# Patient Record
Sex: Female | Born: 1945
Health system: Southern US, Academic
[De-identification: ages and names within clinical notes are randomized; demographics above are authoritative.]

## PROBLEM LIST (undated history)

## (undated) DIAGNOSIS — G4730 Sleep apnea, unspecified: Secondary | ICD-10-CM

## (undated) DIAGNOSIS — F32A Depression, unspecified: Secondary | ICD-10-CM

## (undated) DIAGNOSIS — R0602 Shortness of breath: Secondary | ICD-10-CM

## (undated) DIAGNOSIS — M1990 Unspecified osteoarthritis, unspecified site: Secondary | ICD-10-CM

## (undated) DIAGNOSIS — E079 Disorder of thyroid, unspecified: Secondary | ICD-10-CM

## (undated) DIAGNOSIS — E119 Type 2 diabetes mellitus without complications: Secondary | ICD-10-CM

## (undated) DIAGNOSIS — F419 Anxiety disorder, unspecified: Secondary | ICD-10-CM

## (undated) DIAGNOSIS — Z9181 History of falling: Secondary | ICD-10-CM

## (undated) DIAGNOSIS — I10 Essential (primary) hypertension: Secondary | ICD-10-CM

## (undated) DIAGNOSIS — F329 Major depressive disorder, single episode, unspecified: Secondary | ICD-10-CM

## (undated) DIAGNOSIS — Z86718 Personal history of other venous thrombosis and embolism: Secondary | ICD-10-CM

## (undated) DIAGNOSIS — L02415 Cutaneous abscess of right lower limb: Secondary | ICD-10-CM

## (undated) DIAGNOSIS — K219 Gastro-esophageal reflux disease without esophagitis: Secondary | ICD-10-CM

## (undated) DIAGNOSIS — L03115 Cellulitis of right lower limb: Secondary | ICD-10-CM

## (undated) HISTORY — PX: HAND SURGERY: SHX662

## (undated) HISTORY — PX: HX BUNIONECTOMY: SHX129

## (undated) HISTORY — PX: HX TUBAL LIGATION: SHX77

---

## 1996-01-02 ENCOUNTER — Inpatient Hospital Stay (HOSPITAL_COMMUNITY): Payer: Self-pay

## 2018-12-04 IMAGING — US ABD LIMITED
1 series · 14 of 25 positions shown · non-contrast
Comparison: None.

EXAM:  PERNA PROFESSIONAL READ ABD U/S LMTD
INDICATION: Fatty liver.

[Series 1: abd limited · 14 of 63 slices shown]
[im 1/63]
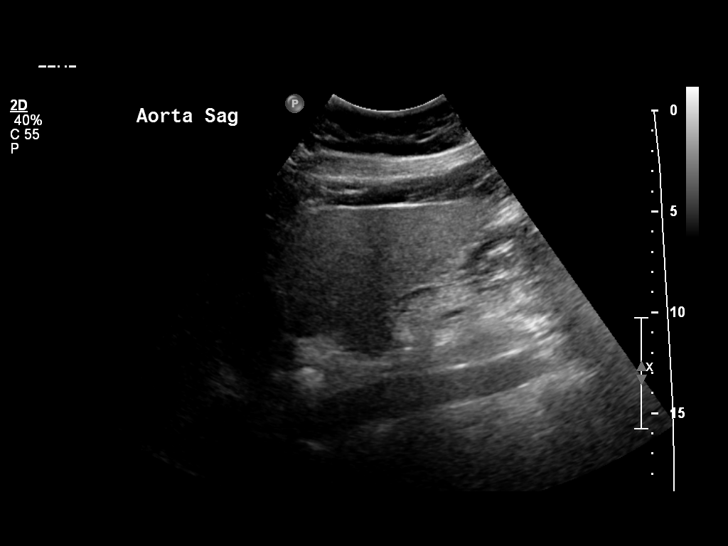
[im 6/63]
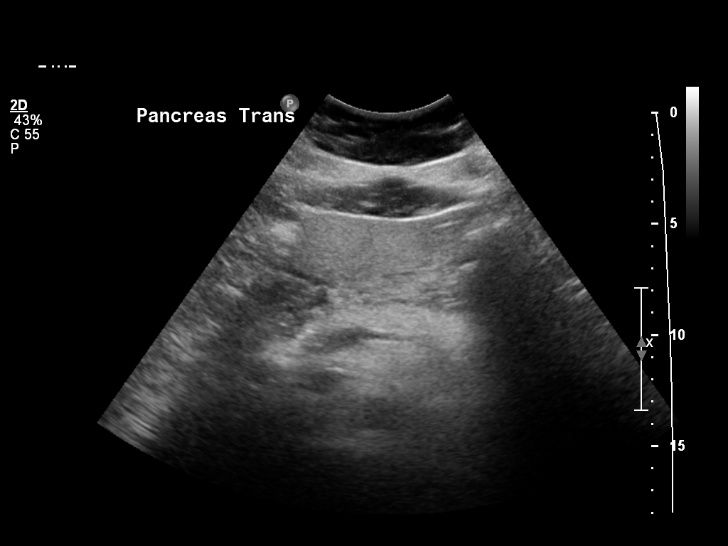
[im 11/63]
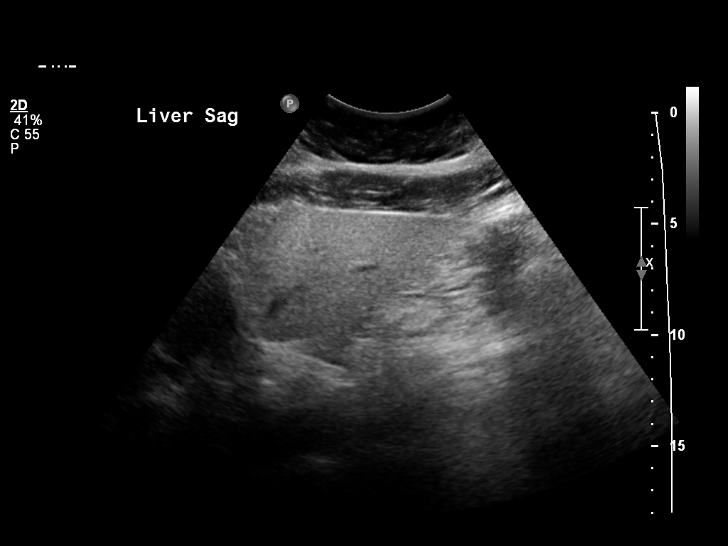
[im 16/63]
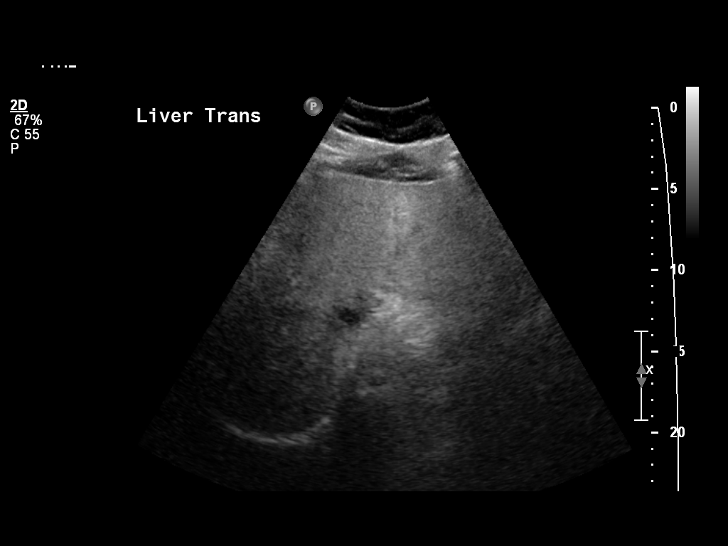
[im 21/63]
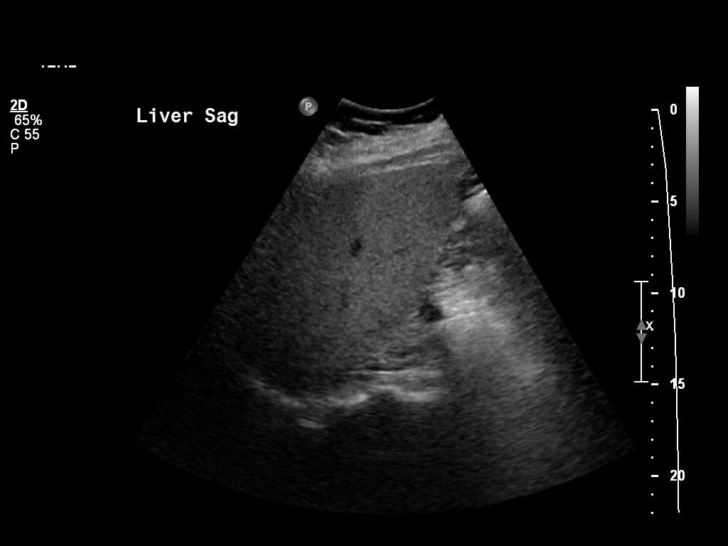
[im 24/63]
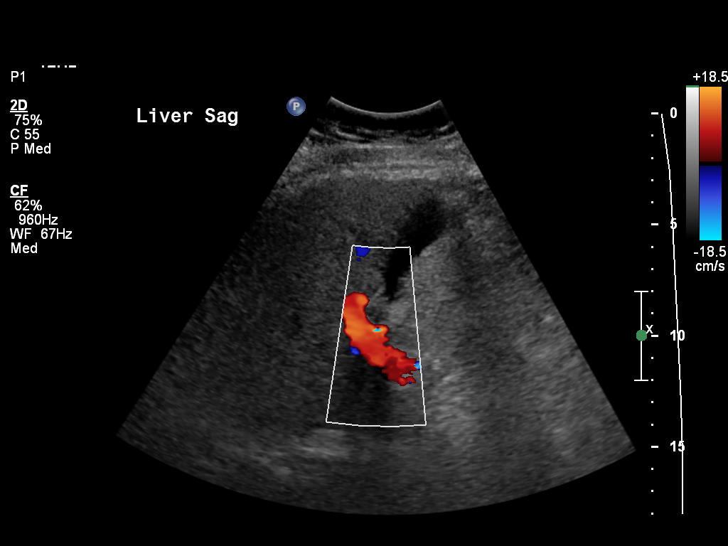
[im 29/63]
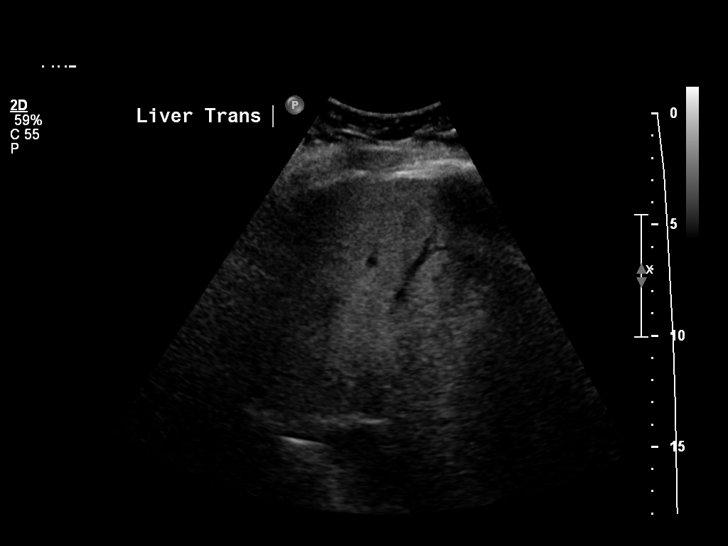
[im 34/63]
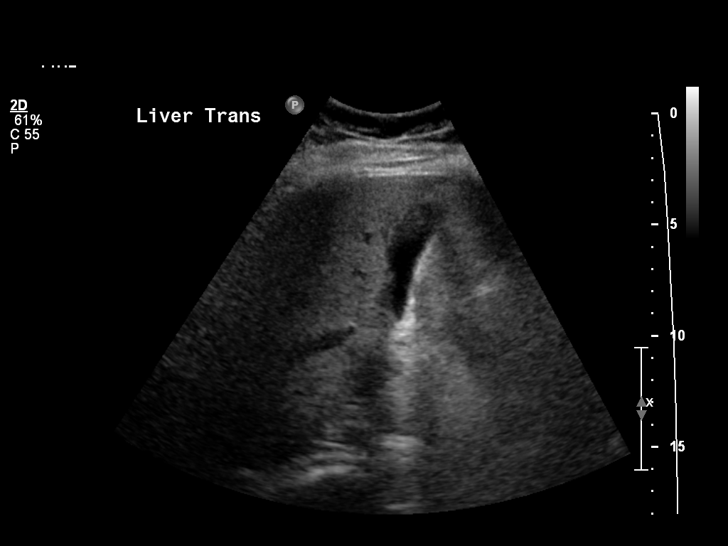
[im 39/63]
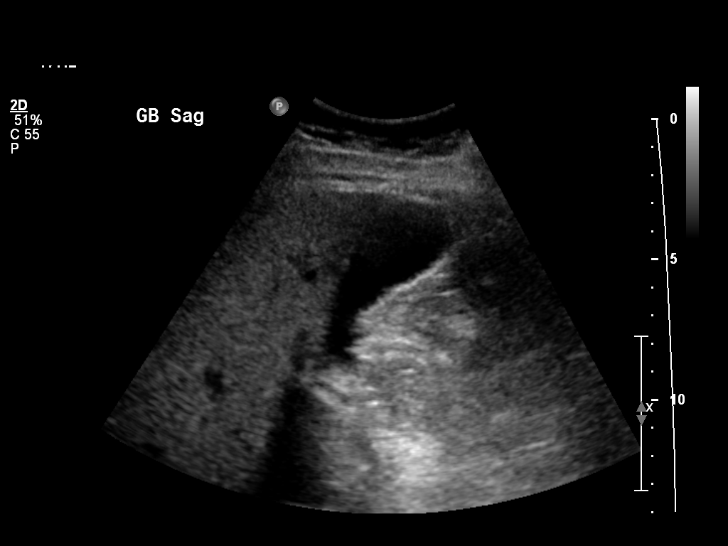
[im 42/63]
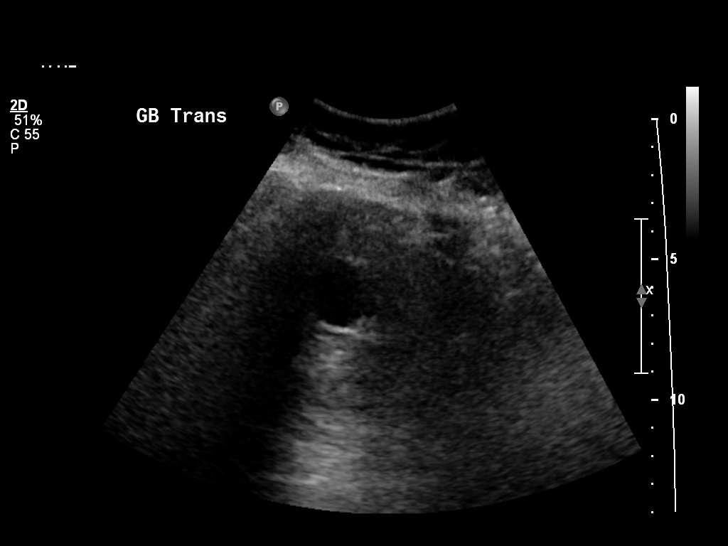
[im 47/63]
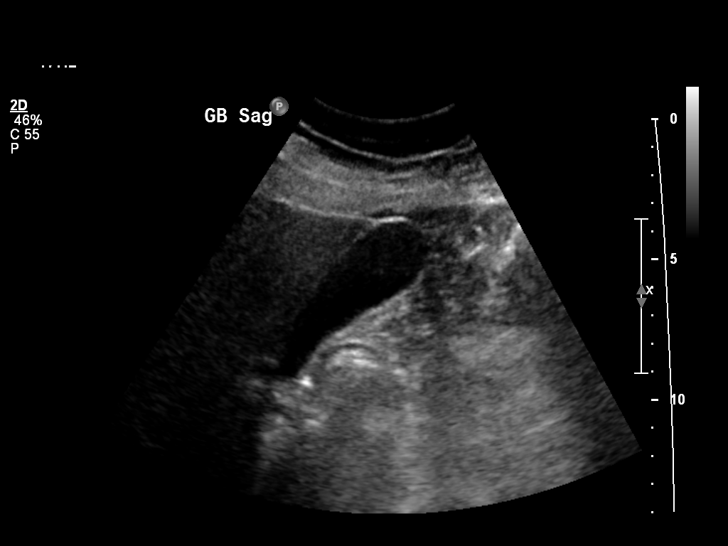
[im 52/63]
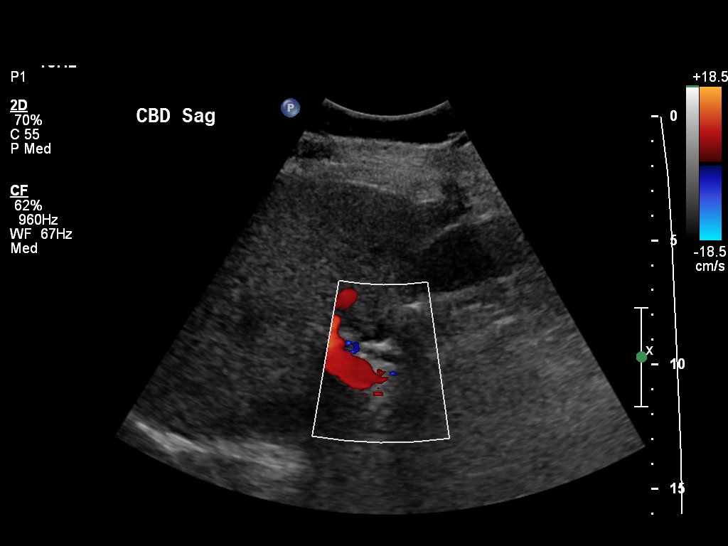
[im 57/63]
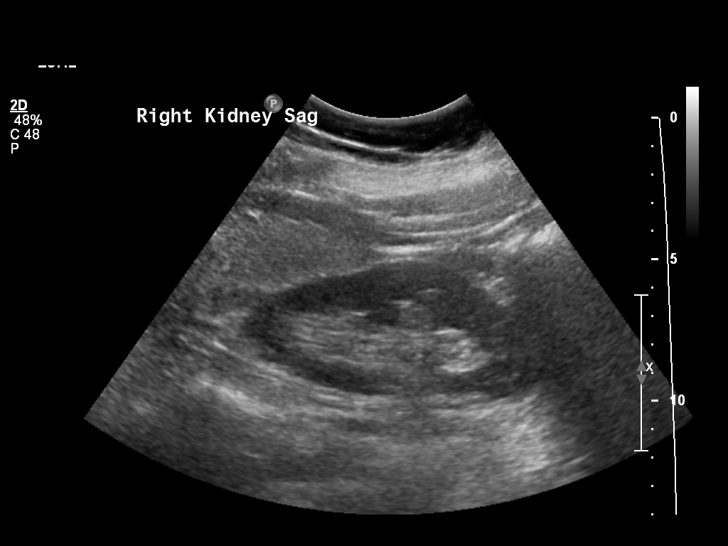
[im 63/63]
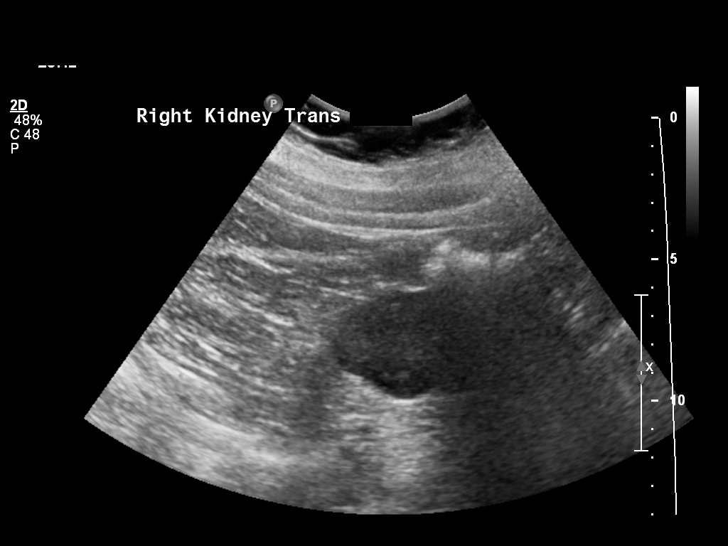

[14 of 25 positions shown; findings below may reference images not displayed]

FINDINGS: The aorta, inferior vena cava and pancreas are partially visualized.  The liver measures 16.5 cm and shows increased echogenicity compatible with fatty infiltration.  The portal vein measures 1.1 cm and shows hepatopetal flow.  The hepatic vein is patent.  No echogenic filling defects are seen within the gallbladder.  The gallbladder wall measures 0.25 cm.  The caliber of the common duct is 3.8 mm.  The right kidney measures 11.2 cm.  No hydronephrosis is noted.  No abdominal ascites is seen.
IMPRESSION: Increased echogenicity of the liver compatible with fatty infiltration.

## 2019-08-16 IMAGING — US ABD LIMITED
1 series · 14 of 25 positions shown · non-contrast
Comparison: None.

EXAM:  MARRERO PROFESSIONAL READ ABD U/S LMTD
INDICATION: Fatty liver.

[Series 1: abd limited · 14 of 65 slices shown]
[im 1/65]
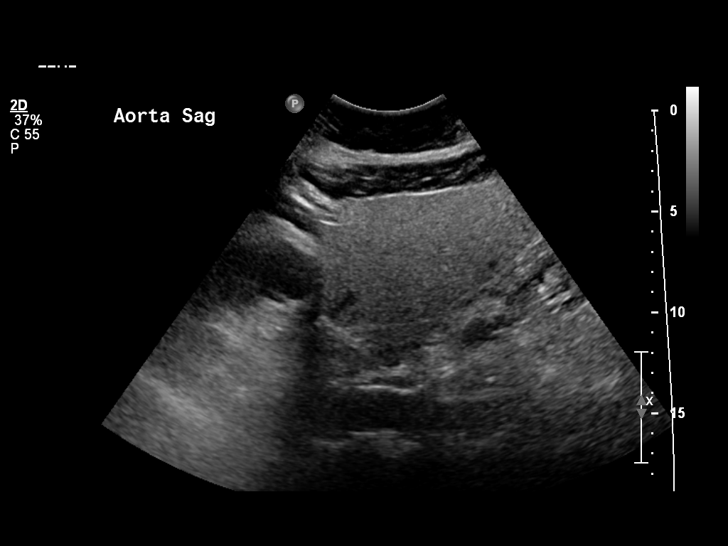
[im 6/65]
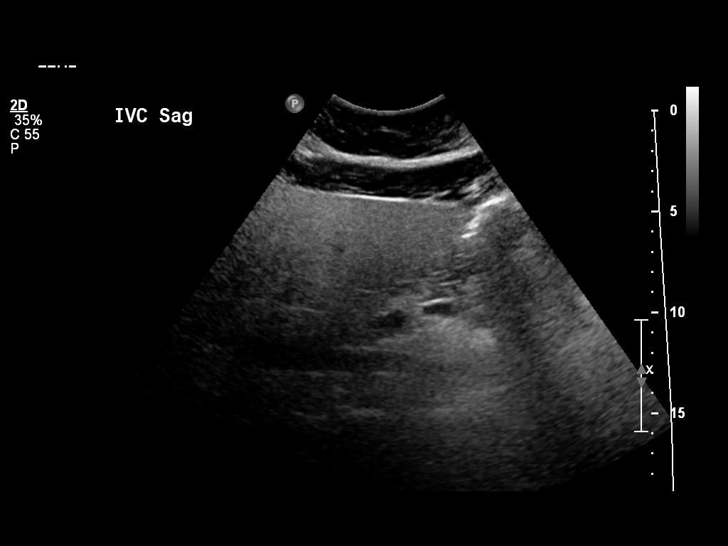
[im 11/65]
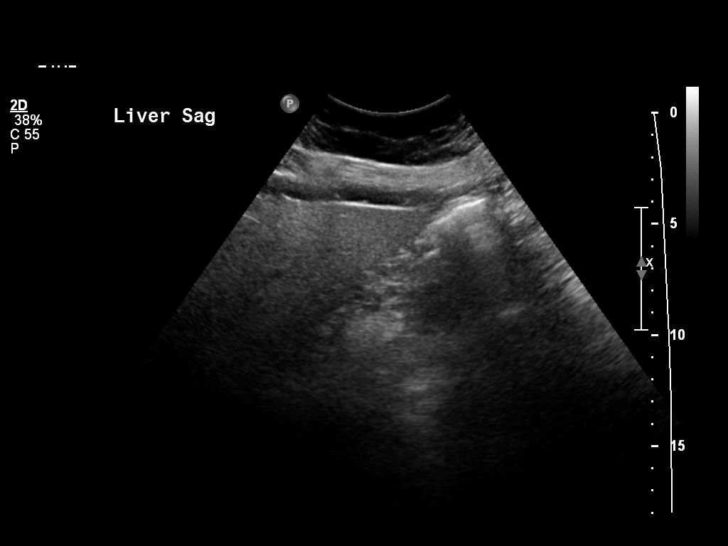
[im 17/65]
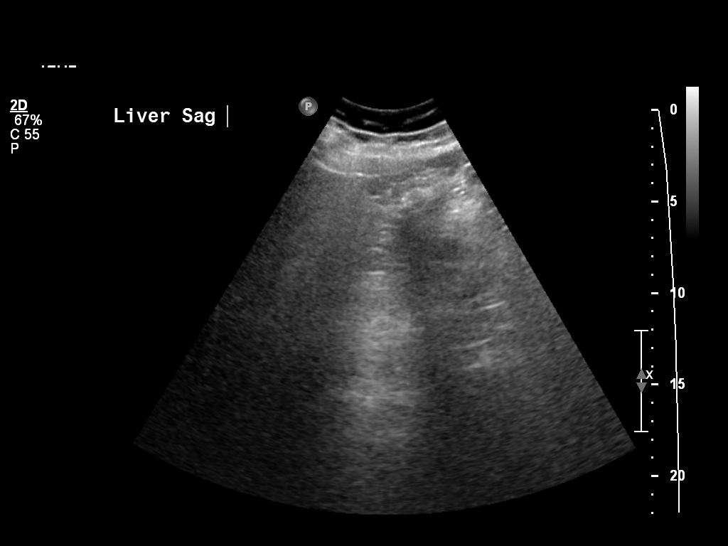
[im 22/65]
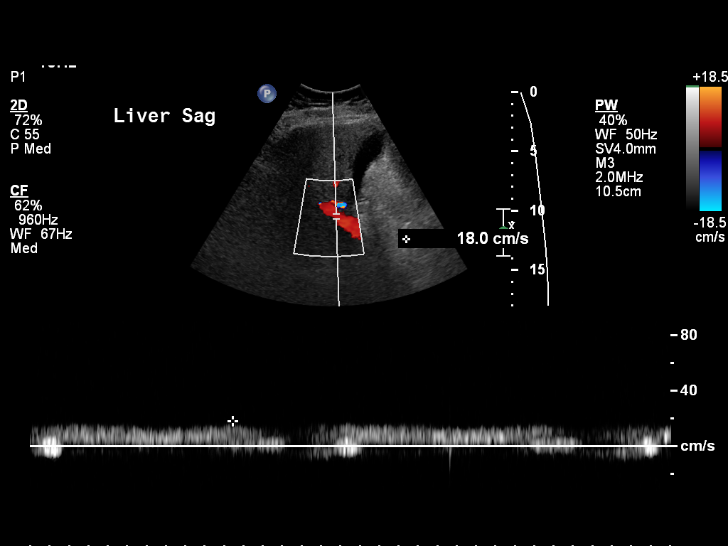
[im 25/65]
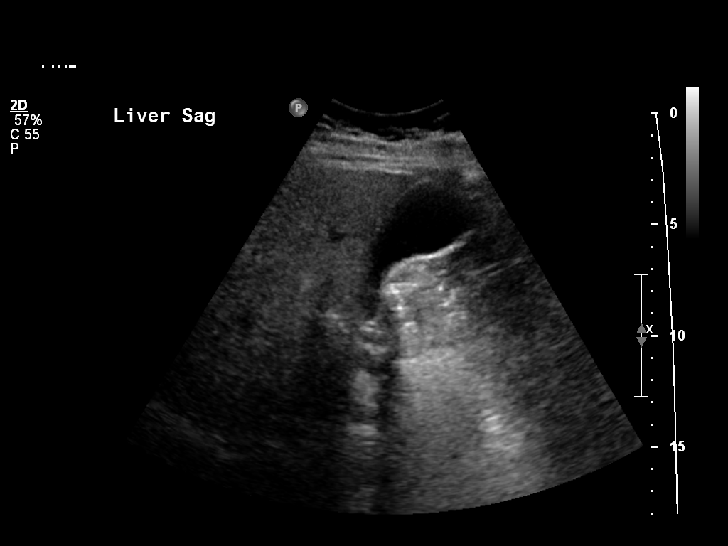
[im 30/65]
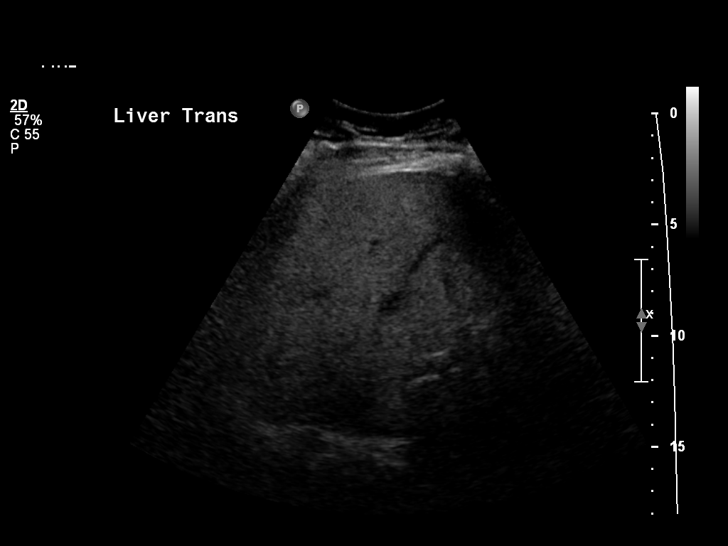
[im 35/65]
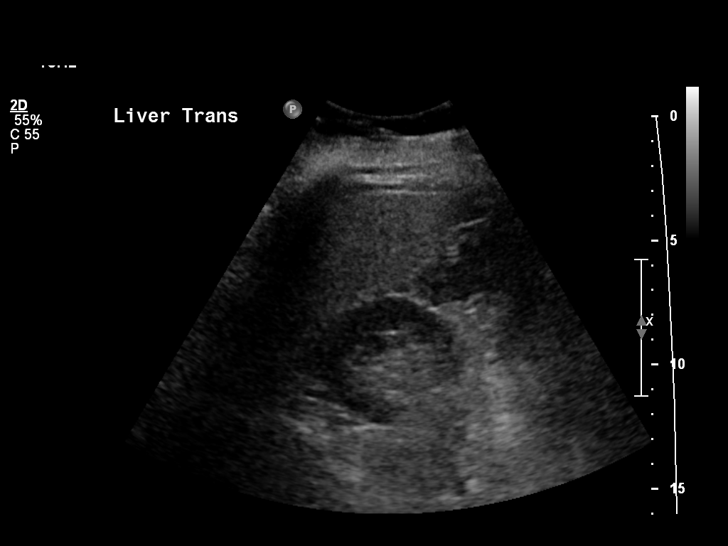
[im 41/65]
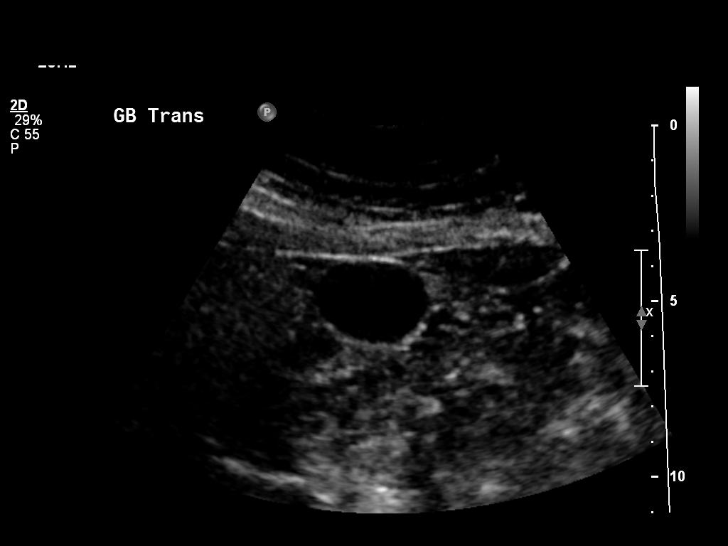
[im 43/65]
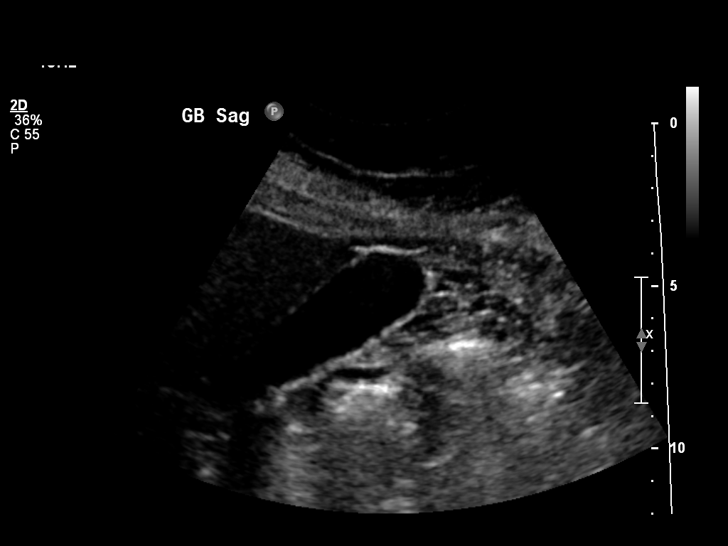
[im 49/65]
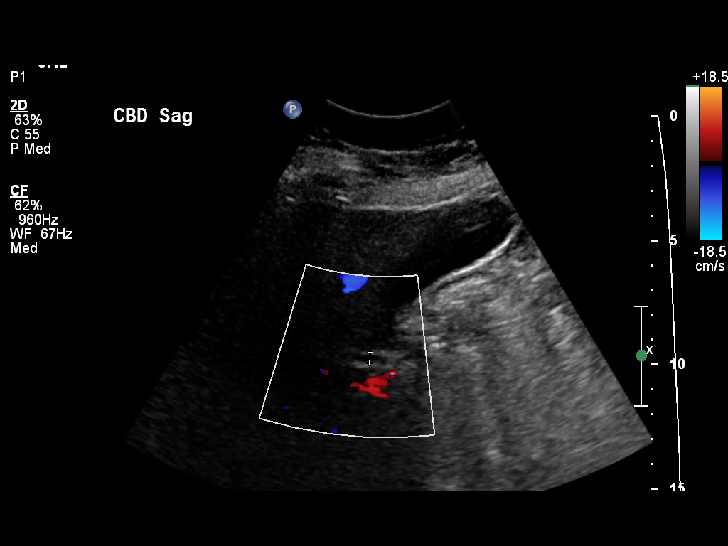
[im 54/65]
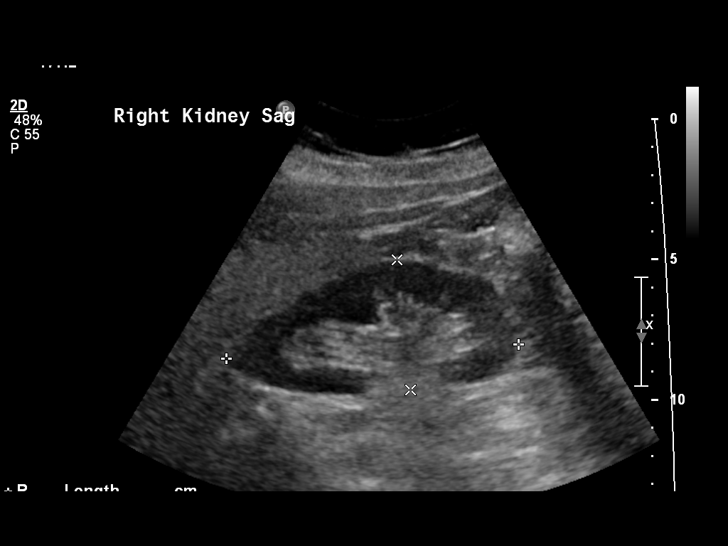
[im 59/65]
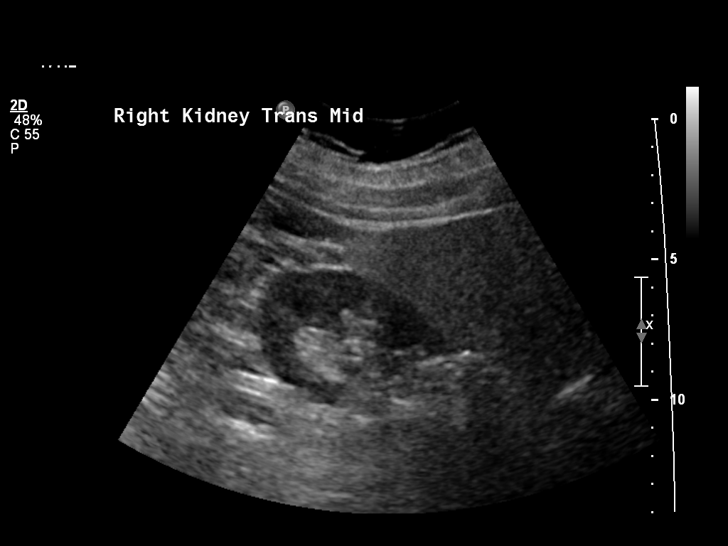
[im 65/65]
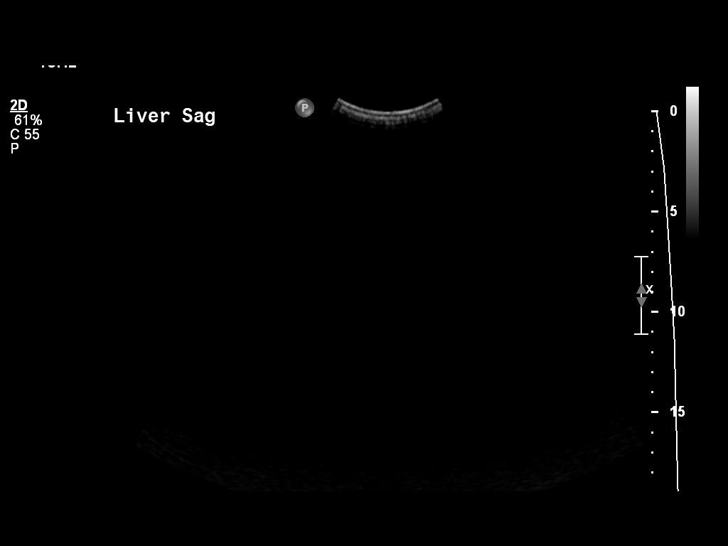

[14 of 25 positions shown; findings below may reference images not displayed]

FINDINGS: The aorta, inferior vena cava and the pancreas are partially visualized.  The liver measures 16.6 cm and shows increased echogenicity.  The portal vein measures 0.8 cm and shows hepatopetal flow.  The hepatic vein is patent.  No echogenic filling defects are seen in the gallbladder.  The gallbladder wall measures 0.39 cm.  The caliber of the common bile duct is 4.3 mm.  The right kidney measures 10.3 cm.   No hydronephrosis is seen.  No ascites is present.
IMPRESSION: Increased echogenicity of the liver compatible with fatty infiltration.

## 2020-05-21 IMAGING — US ABD LIMITED
1 series · 14 of 25 positions shown · non-contrast
Comparison: 02/26/2018.

EXAM:  FOREVAR PROFESSIONAL READ ABD U/S LMTD
INDICATION: Fatty liver.

[Series 1: abd limited · 14 of 59 slices shown]
[im 1/59]
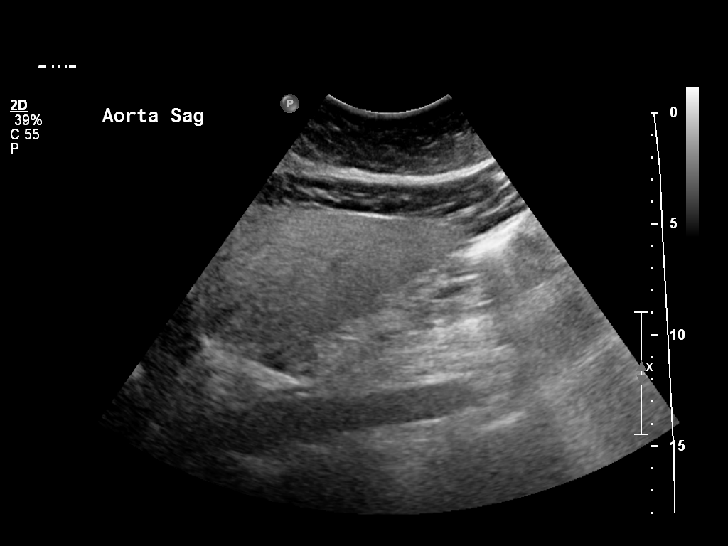
[im 5/59]
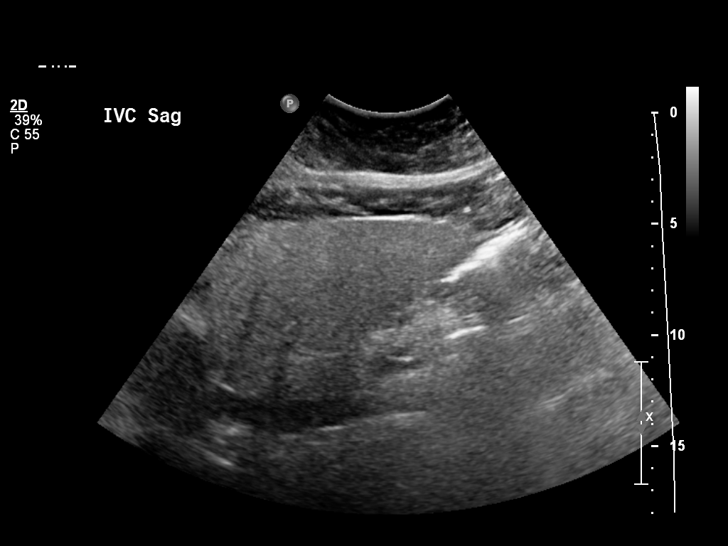
[im 10/59]
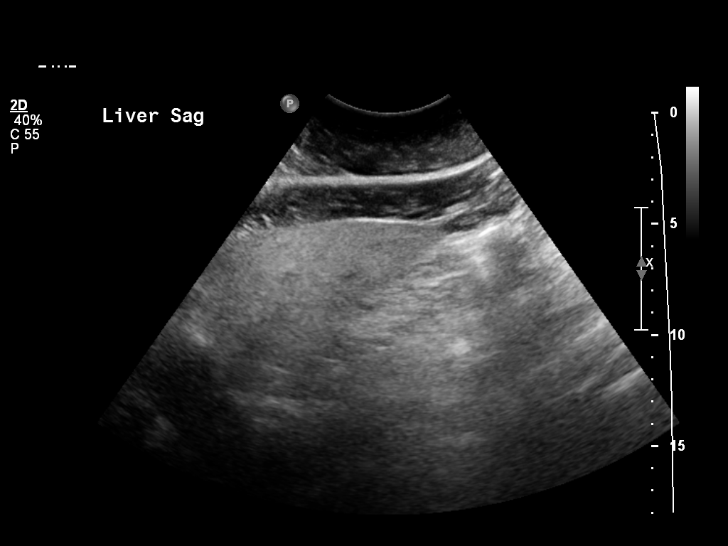
[im 15/59]
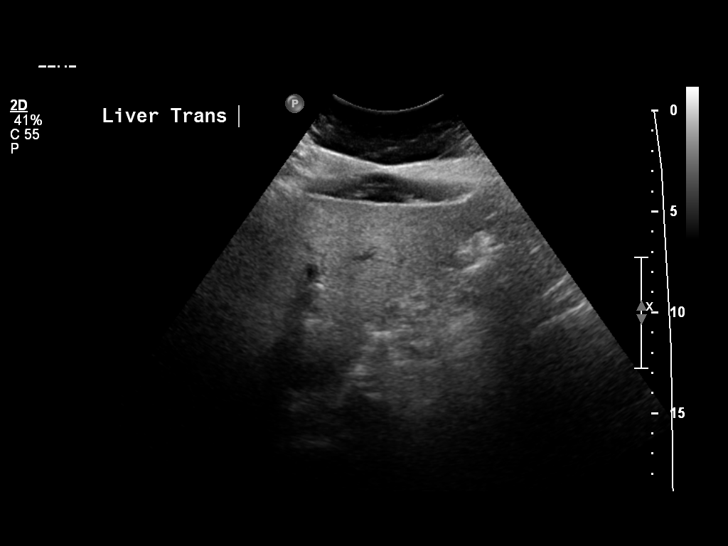
[im 20/59]
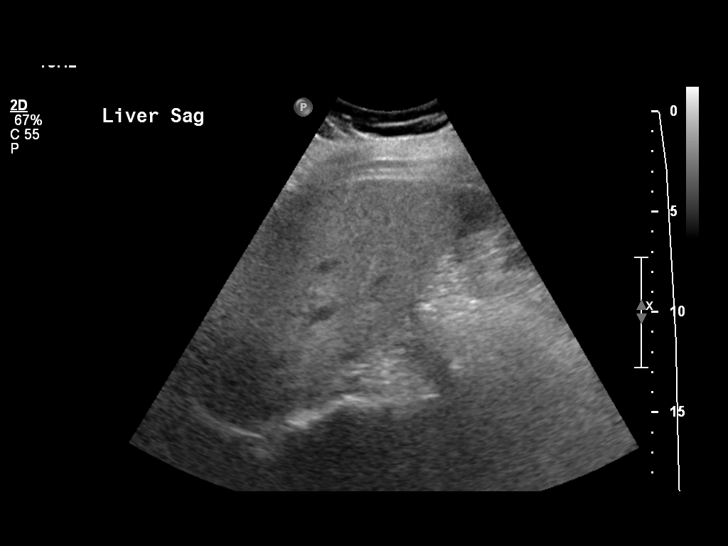
[im 22/59]
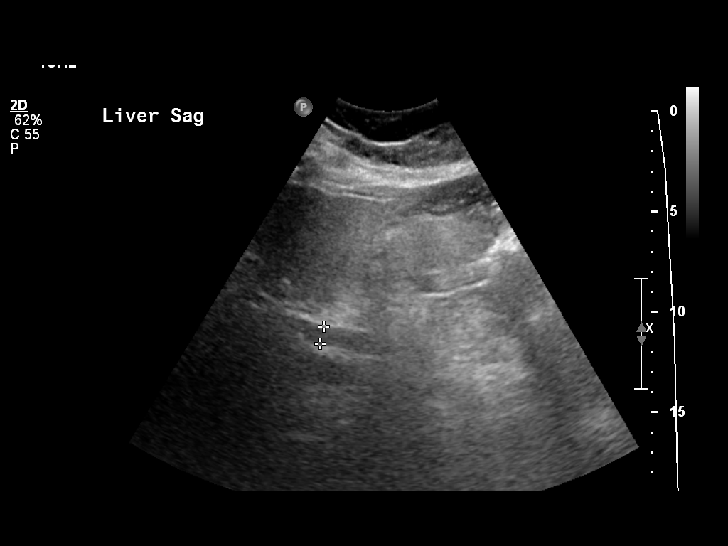
[im 27/59]
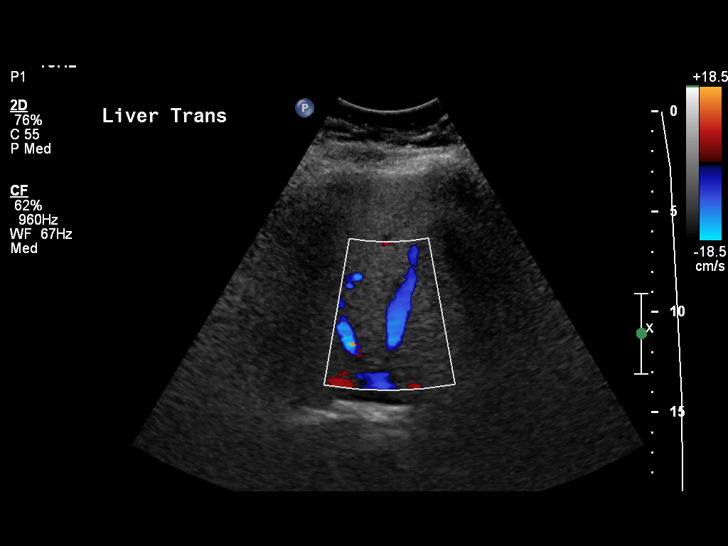
[im 32/59]
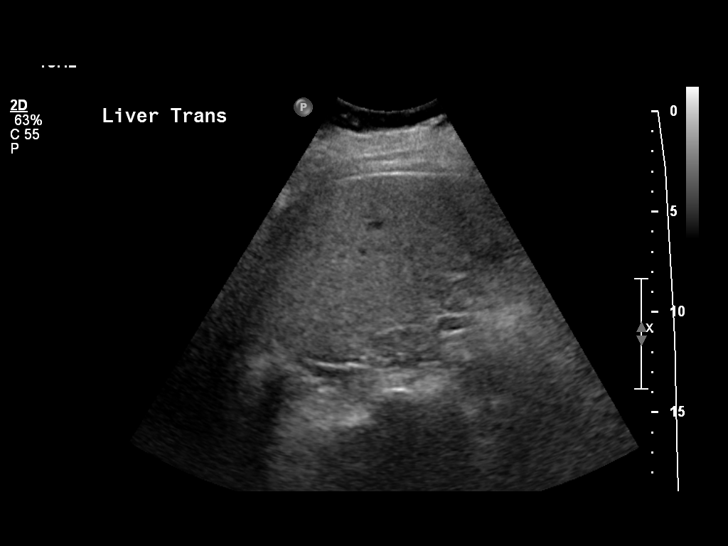
[im 37/59]
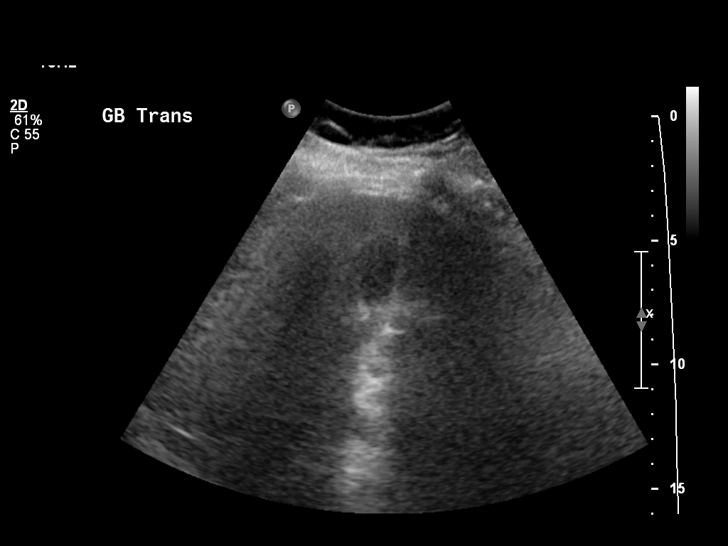
[im 39/59]
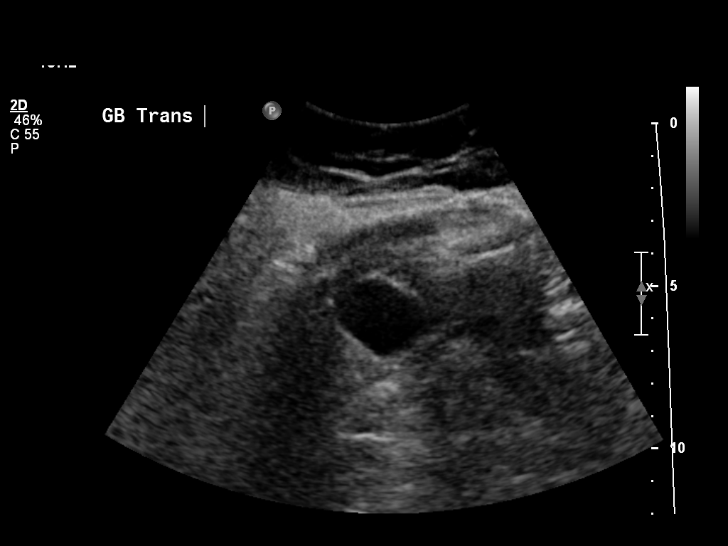
[im 44/59]
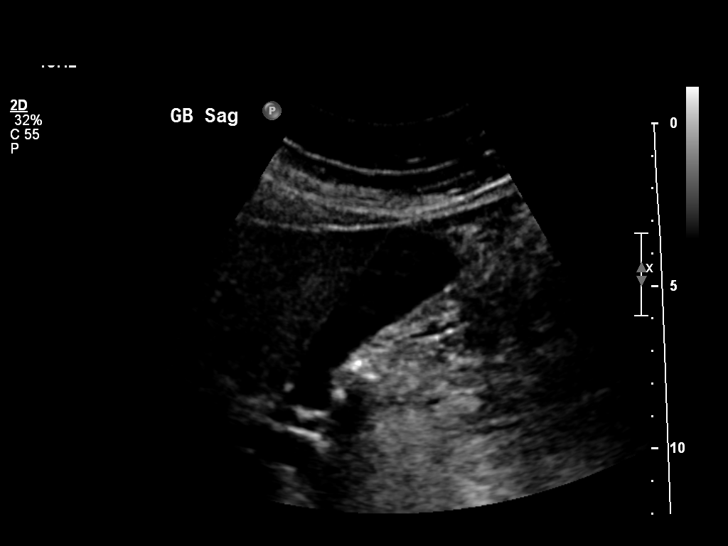
[im 49/59]
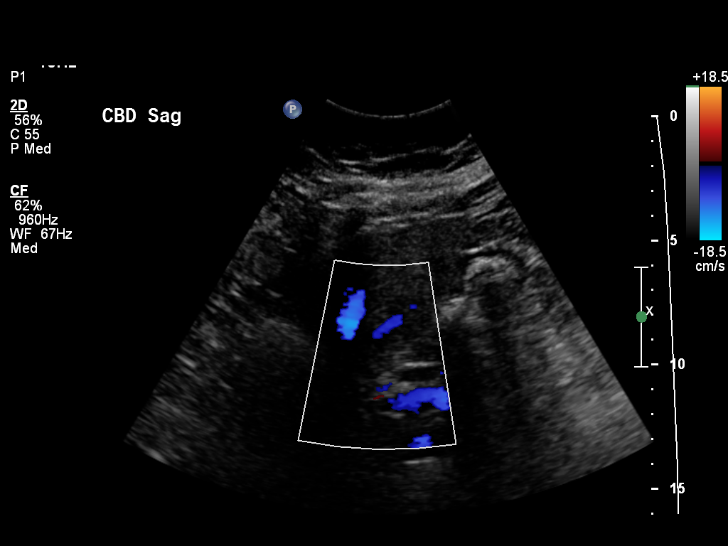
[im 54/59]
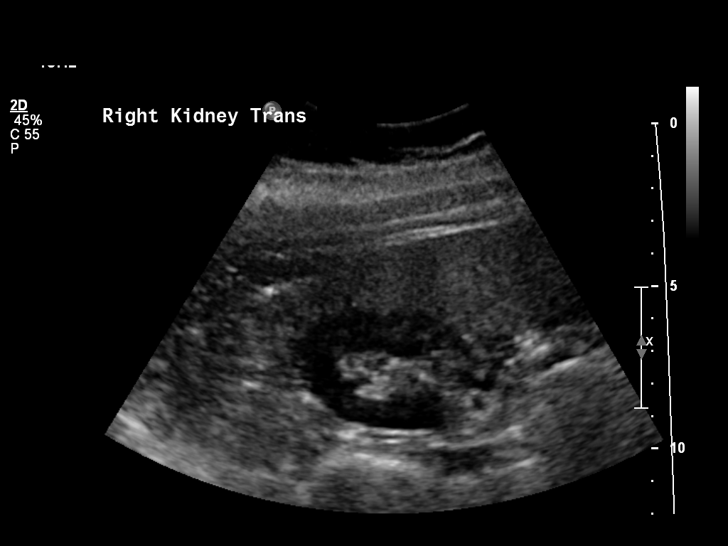
[im 59/59]
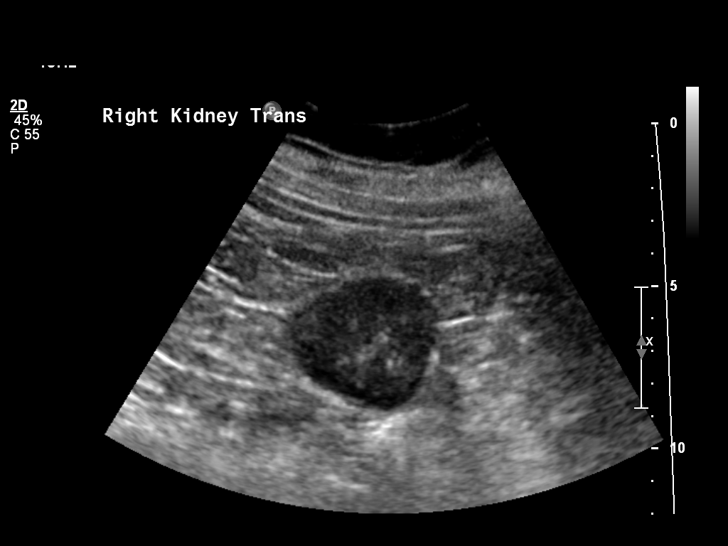

[14 of 25 positions shown; findings below may reference images not displayed]

FINDINGS: Liver is echogenic compatible with fatty infiltration. Fatty infiltration limits evaluation for focal hepatic mass. There is no intra or extrahepatic biliary ductal dilatation. Common bile duct measures 5 mm. No sludge or shadowing gallstones are seen. There is no gallbladder wall thickening or pericholecystic fluid. Pancreas is incompletely visualized due to artifact from overlying bowel gas. Right kidney measures 10 cm and is normal.

Visualized abdominal aorta is without aneurysmal dilatation. IVC is normal. Portal vein measures 9 mm in diameter and demonstrates hepatopetal flow. Hepatic veins are also patent. There is no ascites.
IMPRESSION: 1. Fatty liver. 

2. No evidence of cholelithiasis or acute cholecystitis. 

3. Pancreas incompletely visualized due to artifact from overlying bowel gas.

## 2021-06-18 IMAGING — MR MRI BRAIN WITHOUT AND WITH CONTRAST
10 of 12 series · 35 of 48 positions shown · IV contrast (gadavist)
Comparison: None available.

﻿EXAM:  [id number]   MRI BRAIN WITHOUT AND WITH CONTRAST
INDICATION: 75-year-old female with history of loss of balance.  Hypertension and diabetes.
TECHNIQUE: Axial, coronal and sagittal images including thin section high-resolution images of posterior fossa and cerebellopontine angles and including postcontrast series after administration of 5 mL Gadavist IV.

[Series 6: DWI · axial · 5.0mm · 1.35mm/px · z∈[-67,+59]mm · 9 of 88 slices shown (1 of 3)]
[im 1/88]
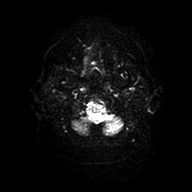
[im 16/88]
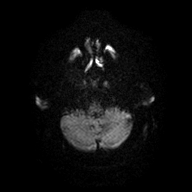
[im 24/88]
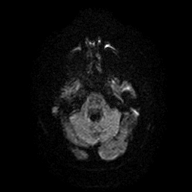
[im 40/88]
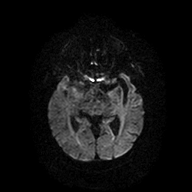
[im 48/88]
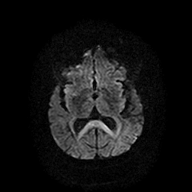
[im 64/88]
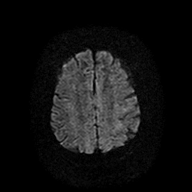
[im 72/88]
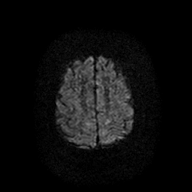
[im 80/88]
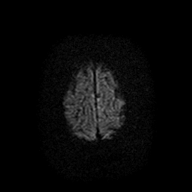
[im 88/88]
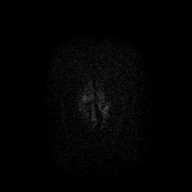

[Series 7: DWI · axial · 5.0mm · 1.35mm/px · z∈[-67,+59]mm · 4 of 22 slices shown (2 of 3)]
[im 1/22]
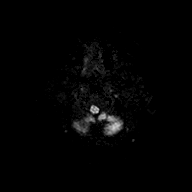
[im 8/22]
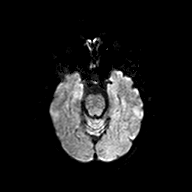
[im 15/22]
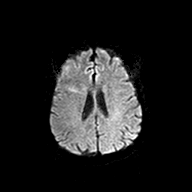
[im 22/22]
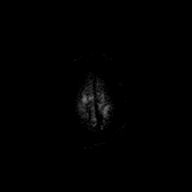

[Series 8: DWI · axial · 5.0mm · 1.35mm/px · z∈[-67,+59]mm · 3 of 21 slices shown (3 of 3)]
[im 1/21]
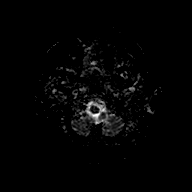
[im 11/21]
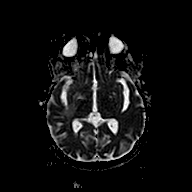
[im 21/21]
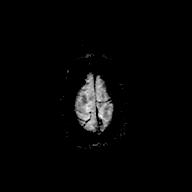

[Series 9: FLAIR · sagittal · 4.0mm · 0.75mm/px · 3 of 26 slices shown (1 of 2)]
[im 1/26]
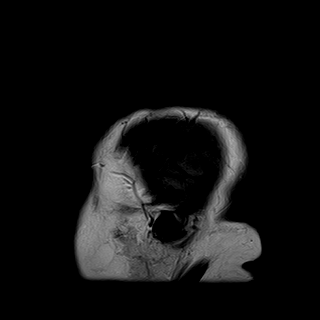
[im 13/26]
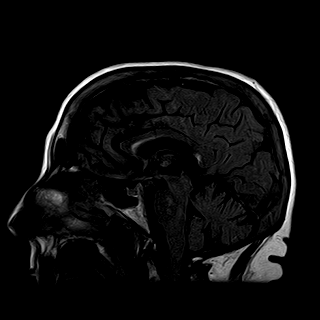
[im 26/26]
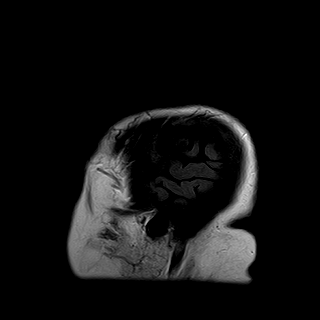

[Series 10: T2 · axial · 4.0mm · 0.43mm/px · z∈[-68,+72]mm · 4 of 36 slices shown]
[im 1/36]
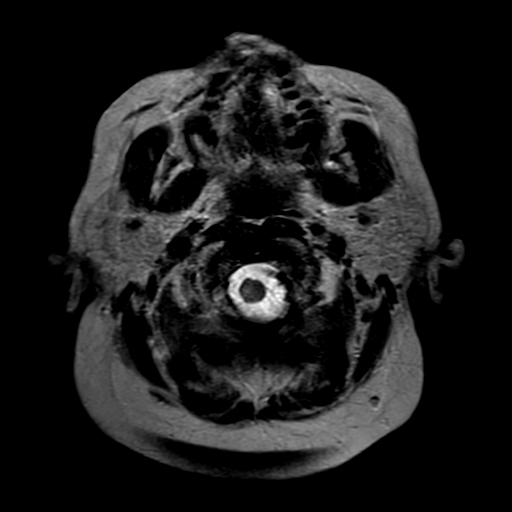
[im 12/36]
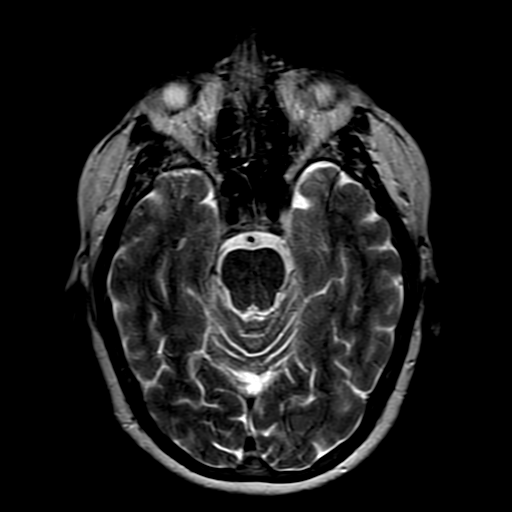
[im 24/36]
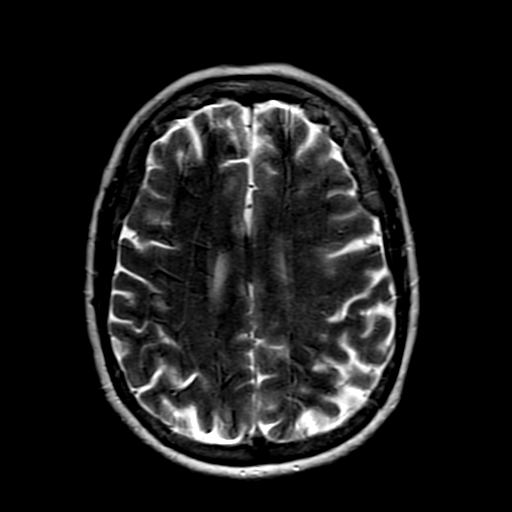
[im 36/36]
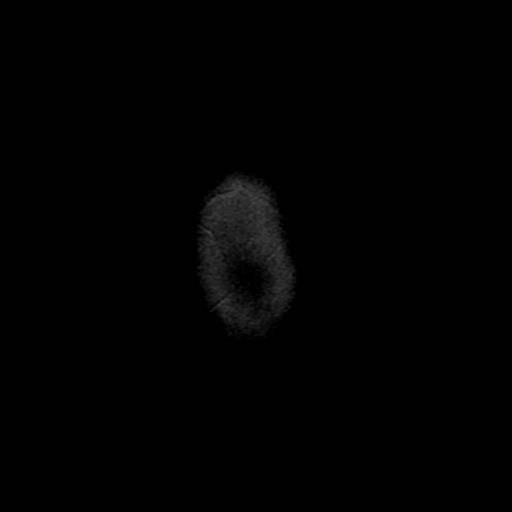

[Series 11: FLAIR · axial · 4.0mm · 0.43mm/px · z∈[-68,+72]mm · 4 of 36 slices shown (2 of 2)]
[im 1/36]
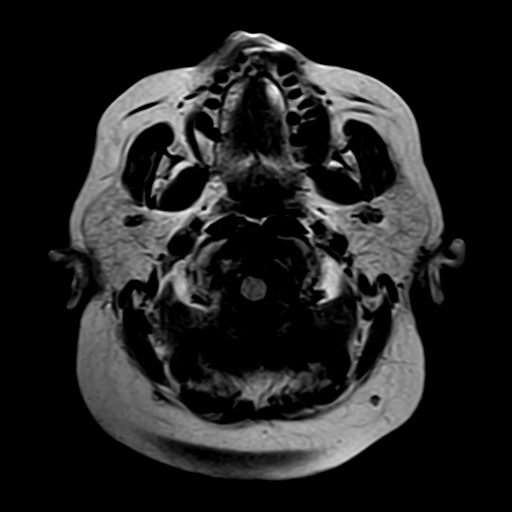
[im 12/36]
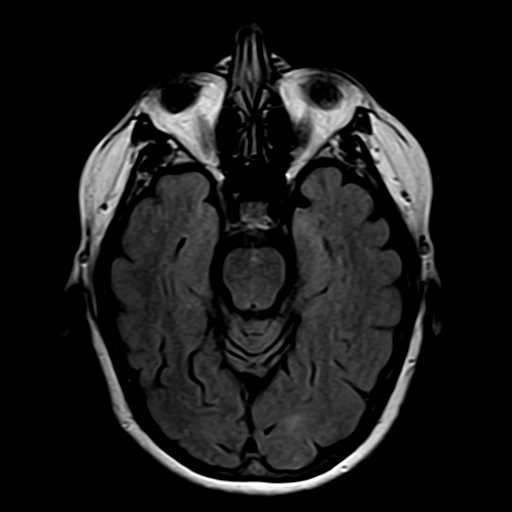
[im 24/36]
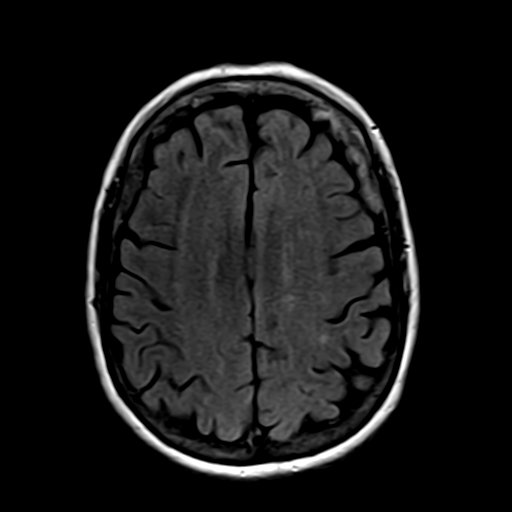
[im 36/36]
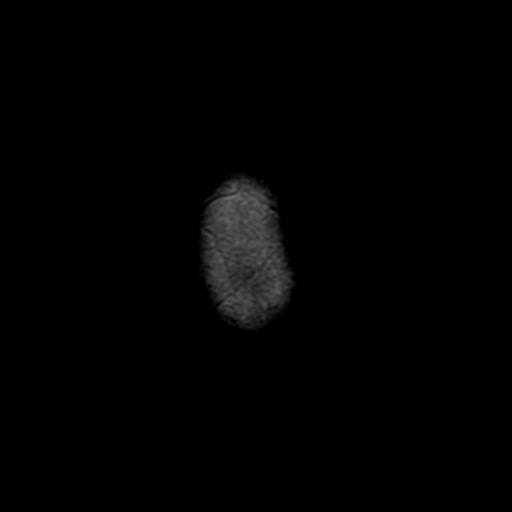

[Series 12: T1 · axial · 4.0mm · 0.43mm/px · z∈[-68,-24]mm · 2 of 36 slices shown]
[im 1/36]
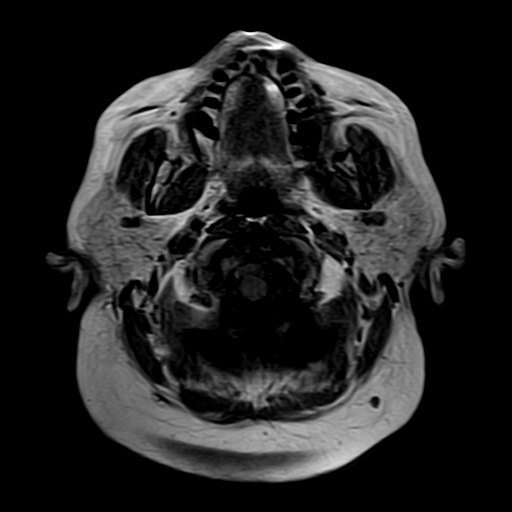
[im 12/36]
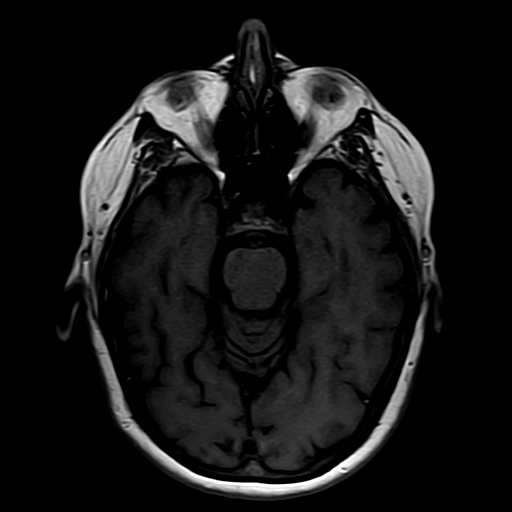

[Series 15: T1 post-contrast · axial · 3.0mm · 0.47mm/px · 1 of 11 slices shown (1 of 3)]
[im 1/11]
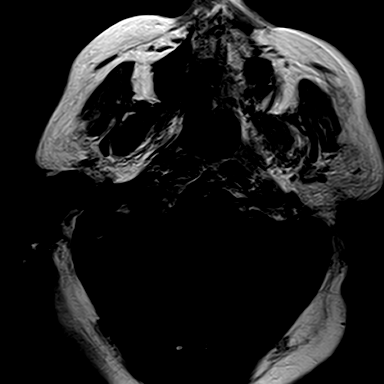

[Series 16: T1 post-contrast · axial · 4.0mm · 0.43mm/px · z∈[-68,+72]mm · 4 of 36 slices shown (2 of 3)]
[im 1/36]
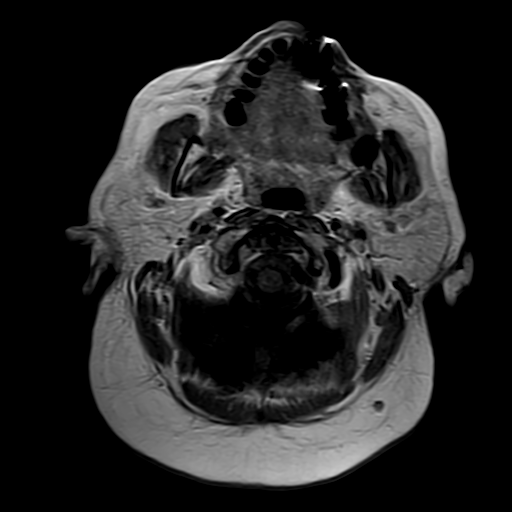
[im 12/36]
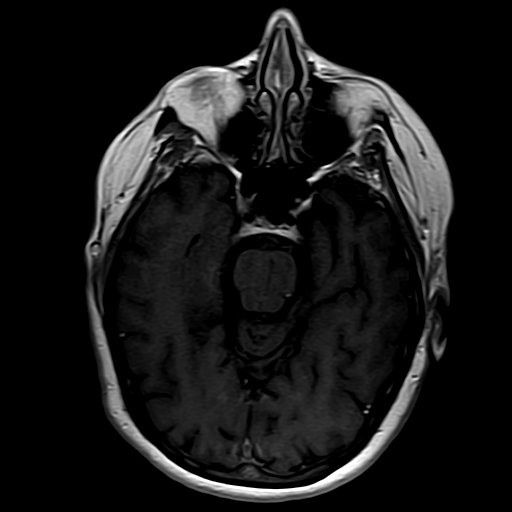
[im 24/36]
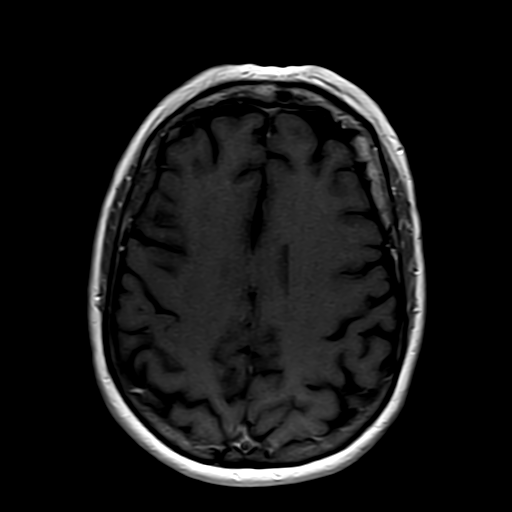
[im 36/36]
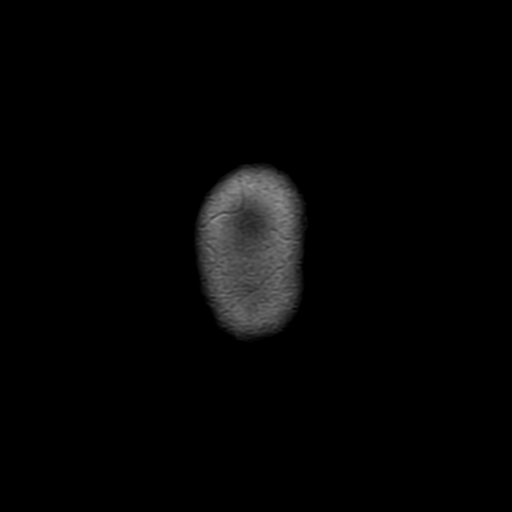

[Series 17: T1 post-contrast · coronal · 3.0mm · 0.47mm/px · 1 of 11 slices shown (3 of 3)]
[im 1/11]
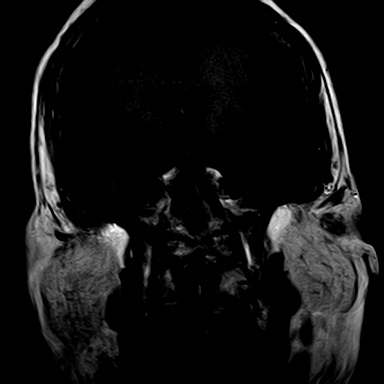

[35 of 48 positions shown; findings below may reference images not displayed]

FINDINGS: No acute ischemic change on the diffusion sequence.  No intracranial bleed or extra-axial collections.  A few small FLAIR bright lesions in the subcortical white matter on both sides due to mild chronic small vessel ischemia.

No ventriculomegaly or midline shift.  Major arteries of circle of Willis and dural venous sinuses are patent.  No abnormal enhancement on postcontrast examination.  

Sinuses and mastoids are clear.
IMPRESSION: 1. No intracranial space occupying lesions.  No focal abnormalities or abnormal enhancement of cerebellopontine angles and internal auditory canals.  

2. No ischemic change or demyelinating lesions are seen.  No ventriculomegaly or midline shift. 

3. Major arteries of circle of Willis and dural venous sinuses are patent.

## 2021-07-11 IMAGING — MR MRI THORACIC SPINE WITHOUT CONTRAST
4 of 5 series · 26 of 48 positions shown · IV contrast (gadolinium)
Comparison: None available.

﻿EXAM:  [id number]   MRI THORACIC SPINE WITHOUT CONTRAST
INDICATION: Neck pain, myelopathy.
TECHNIQUE: Multiplanar multisequential MRI of the thoracic spine was performed without gadolinium contrast.

[Series 5: T2 · sagittal · 4.0mm · 0.78mm/px · 8 of 13 slices shown (1 of 2)]
[im 1/13]
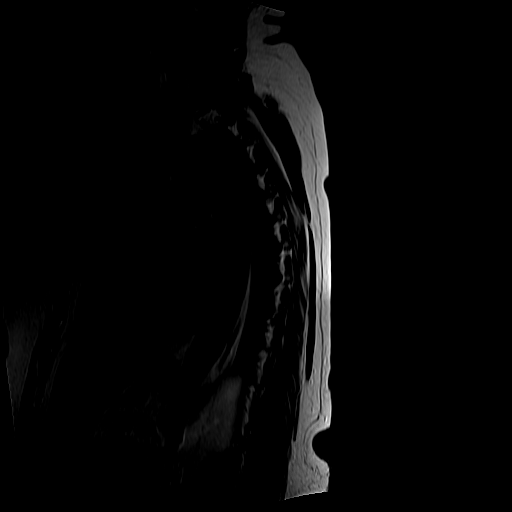
[im 2/13]
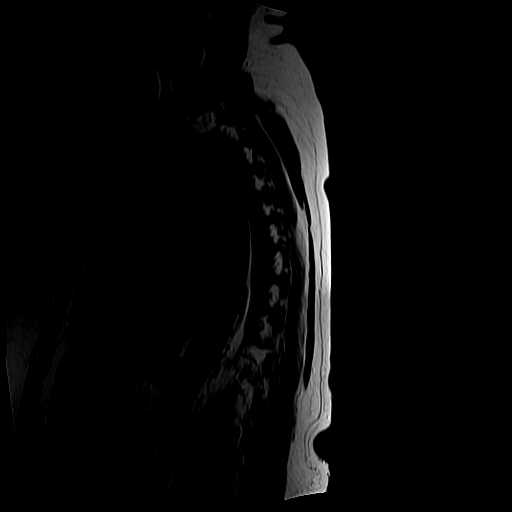
[im 5/13]
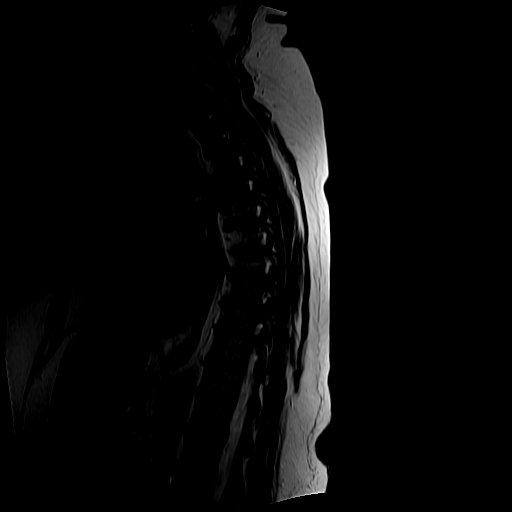
[im 6/13]
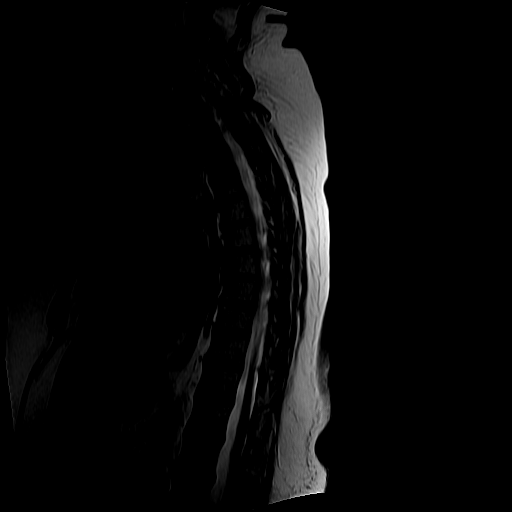
[im 7/13]
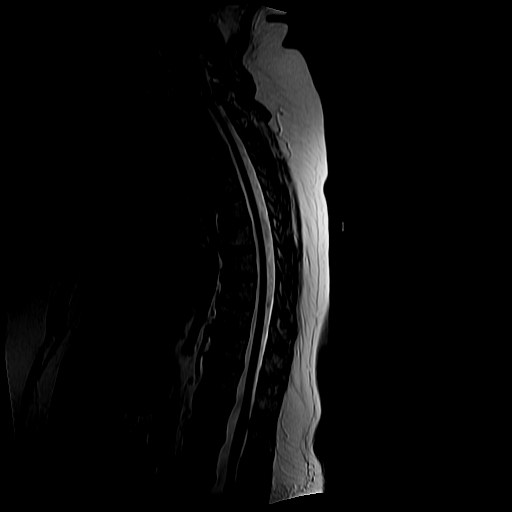
[im 9/13]
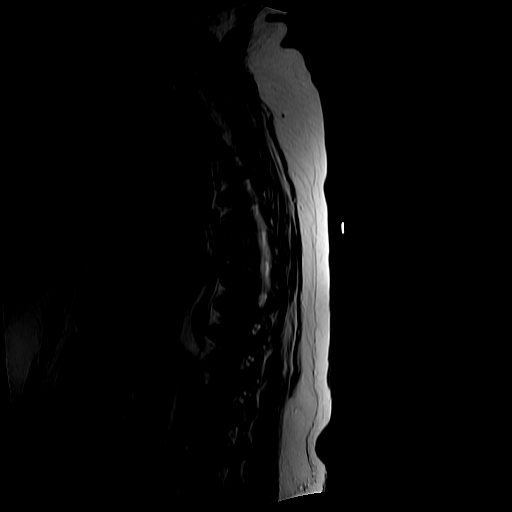
[im 11/13]
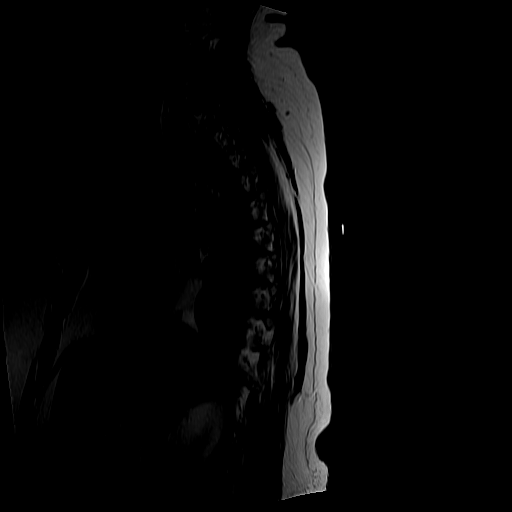
[im 13/13]
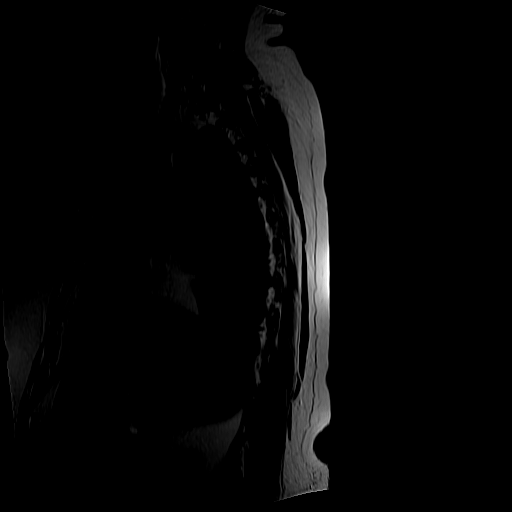

[Series 6: T1 · sagittal · 4.0mm · 0.78mm/px · 6 of 13 slices shown (1 of 2)]
[im 1/13]
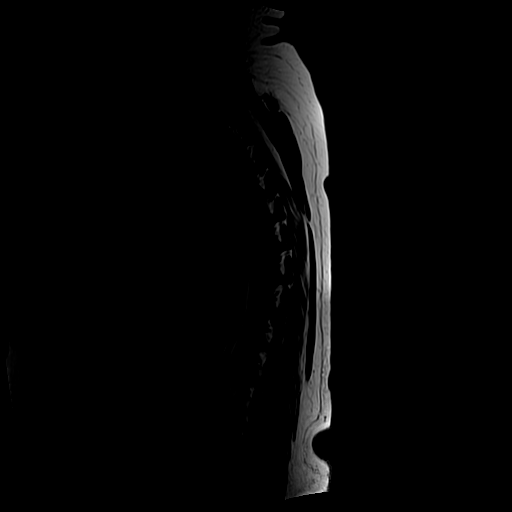
[im 2/13]
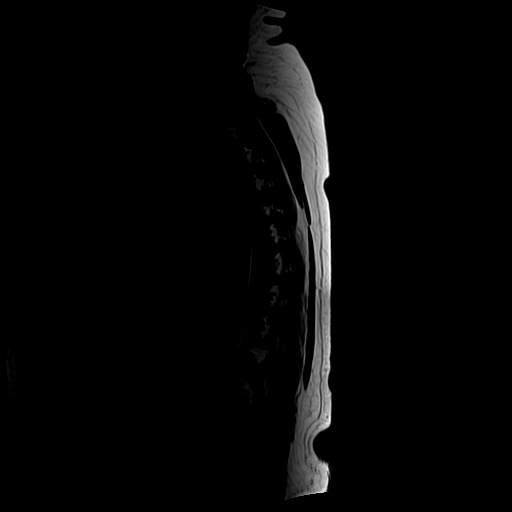
[im 5/13]
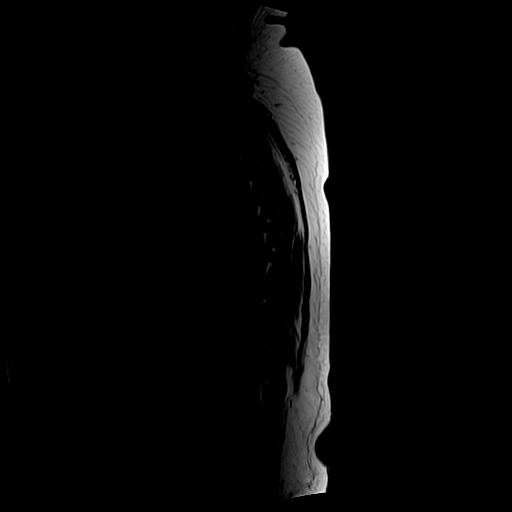
[im 6/13]
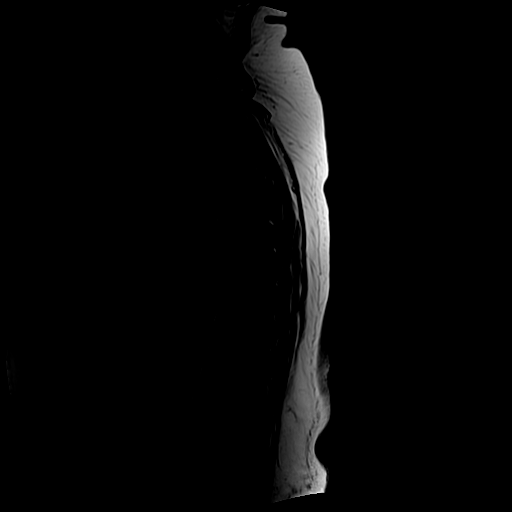
[im 7/13]
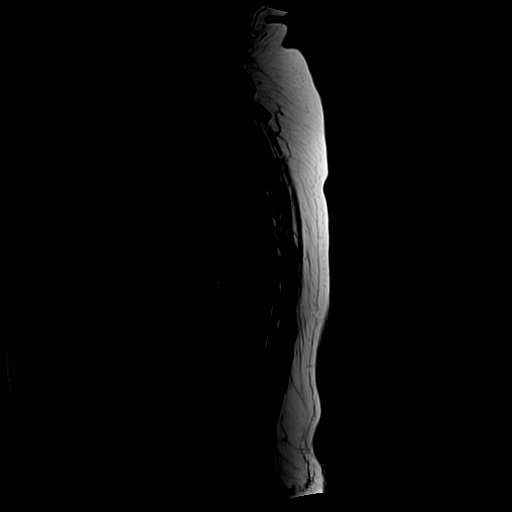
[im 11/13]
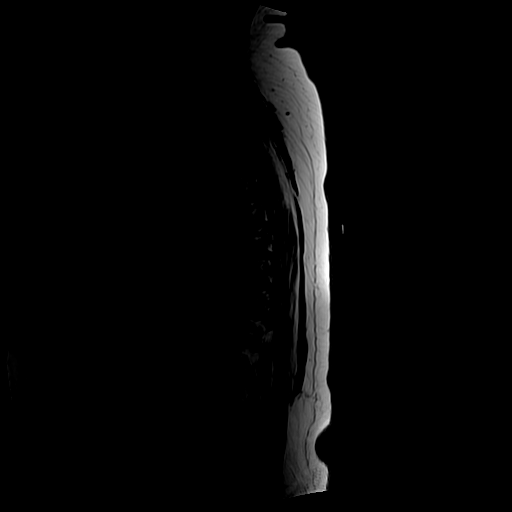

[Series 8: T2 · axial · 4.0mm · 0.62mm/px · z∈[-280,-101]mm · 9 of 12 slices shown (2 of 2)]
[im 1/12]
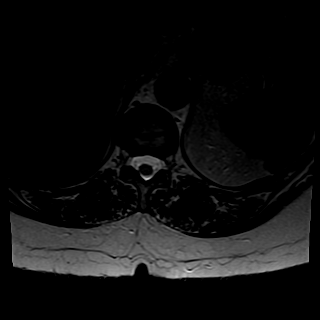
[im 2/12]
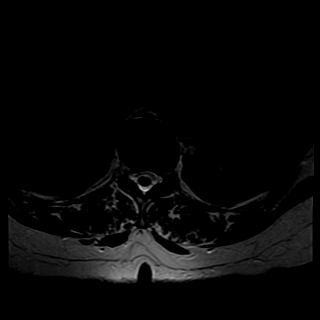
[im 3/12]
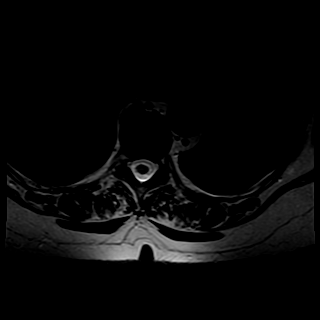
[im 5/12]
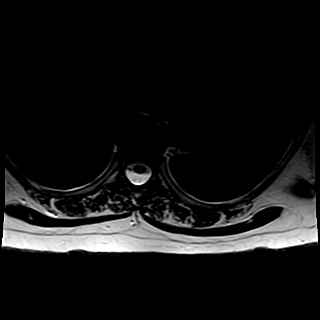
[im 6/12]
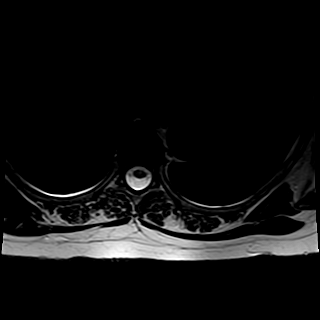
[im 7/12]
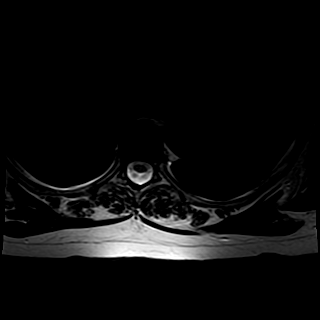
[im 9/12]
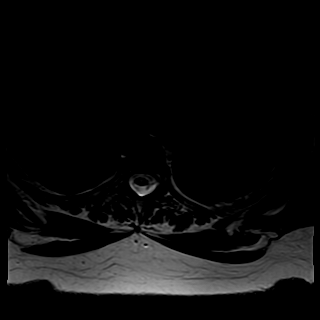
[im 10/12]
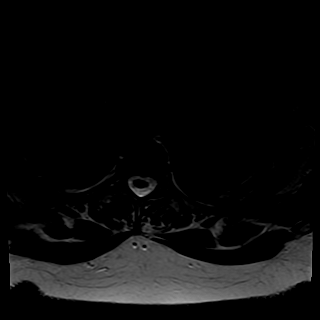
[im 12/12]
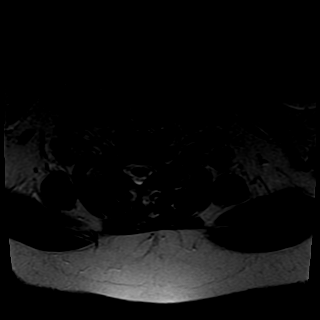

[Series 9: T1 · axial · 4.0mm · 0.62mm/px · z∈[-249,-136]mm · 3 of 12 slices shown (2 of 2)]
[im 2/12]
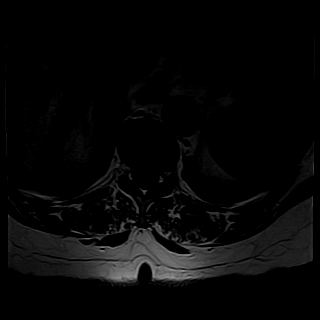
[im 6/12]
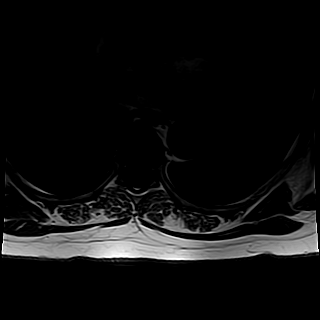
[im 10/12]
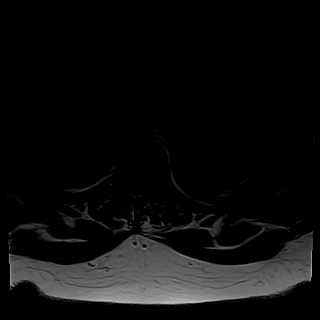

[26 of 48 positions shown; findings below may reference images not displayed]

FINDINGS: Vertebral bodies are normal in height, alignment and signal intensity. There is no acute fracture or subluxation. Visualized spinal cord is normal in signal intensity without evidence of compression at any level.

There is no significant disc herniation, spinal canal or neural foraminal stenosis at any level.

Paraspinal soft tissues are also unremarkable. There is no pleural effusion.
IMPRESSION: Unremarkable exam.

## 2021-07-11 IMAGING — MR MRI LUMBAR SPINE WITHOUT CONTRAST
7 series · 44 of 48 positions shown · IV contrast (gadolinium)
Comparison: None available.

﻿EXAM:  [id number]   MRI LUMBAR SPINE WITHOUT CONTRAST
INDICATION: Back pain, myelopathy.
TECHNIQUE: Multiplanar multisequential MRI of the lumbar spine was performed without gadolinium contrast.

[Series 5: T2 · sagittal · 4.0mm · 0.94mm/px · 6 of 13 slices shown (1 of 4)]
[im 1/13]
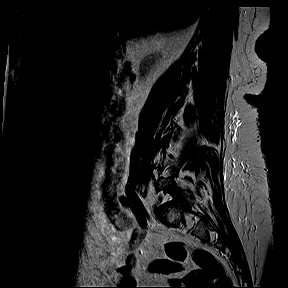
[im 3/13]
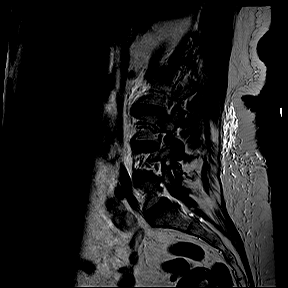
[im 5/13]
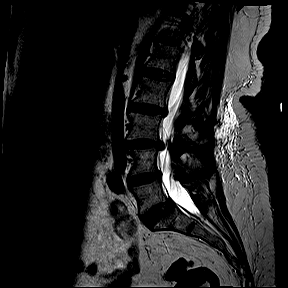
[im 8/13]
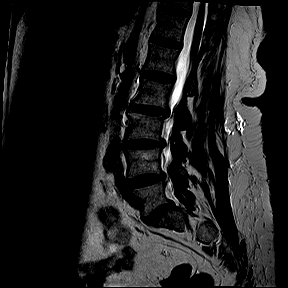
[im 10/13]
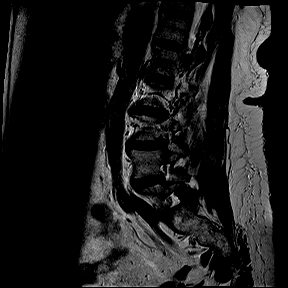
[im 13/13]
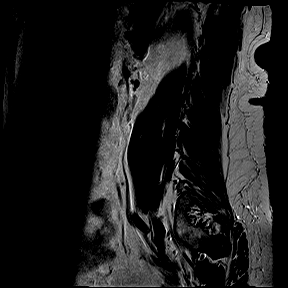

[Series 6: T1 · sagittal · 4.0mm · 0.94mm/px · 6 of 13 slices shown (1 of 2)]
[im 1/13]
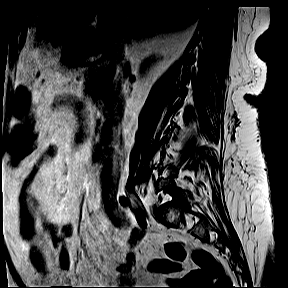
[im 3/13]
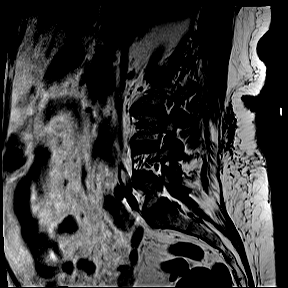
[im 5/13]
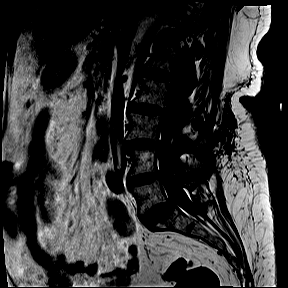
[im 8/13]
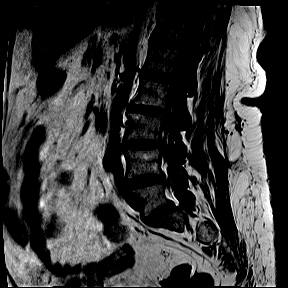
[im 10/13]
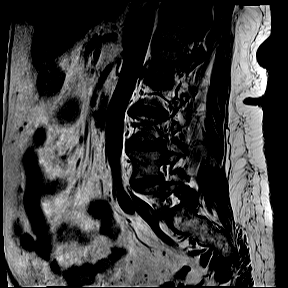
[im 13/13]
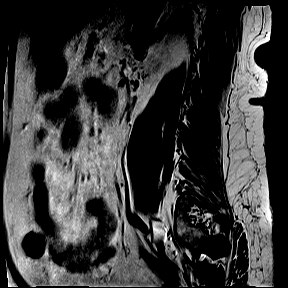

[Series 7: STIR · sagittal · 4.0mm · 1.05mm/px · 2 of 13 slices shown]
[im 1/13]
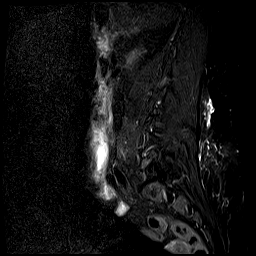
[im 4/13]
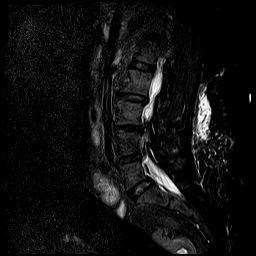

[Series 8: T2 · coronal · 4.0mm · 0.90mm/px · 8 of 20 slices shown (2 of 4)]
[im 1/20]
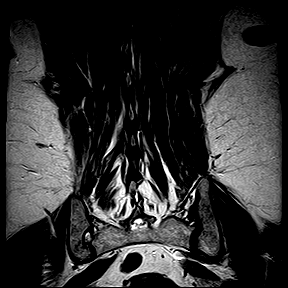
[im 3/20]
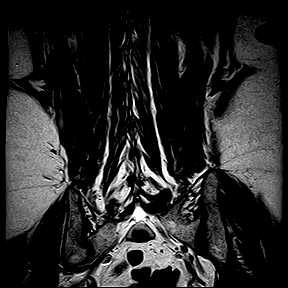
[im 6/20]
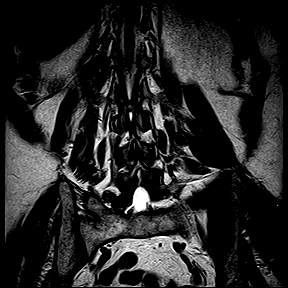
[im 9/20]
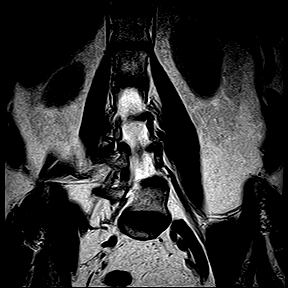
[im 11/20]
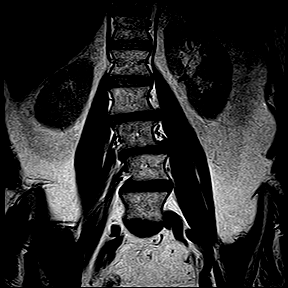
[im 14/20]
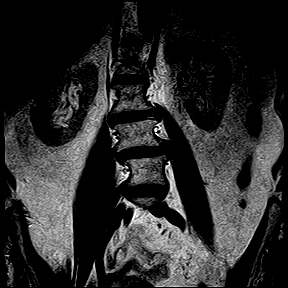
[im 17/20]
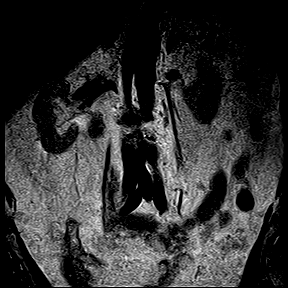
[im 20/20]
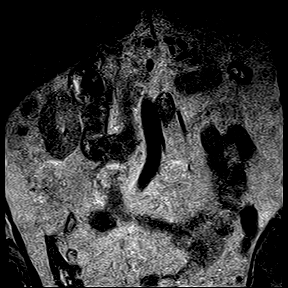

[Series 9: T2 · axial · 4.0mm · 0.47mm/px · z∈[-87,+109]mm · 9 of 23 slices shown (3 of 4)]
[im 1/23]
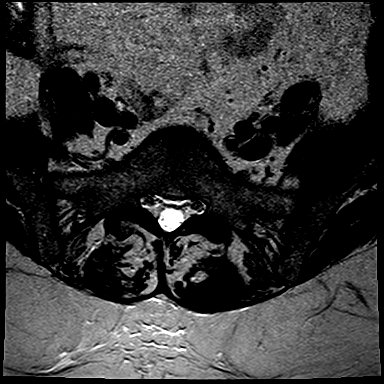
[im 3/23]
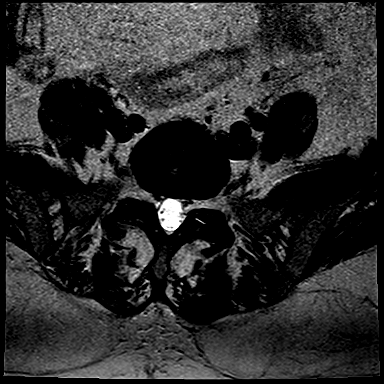
[im 6/23]
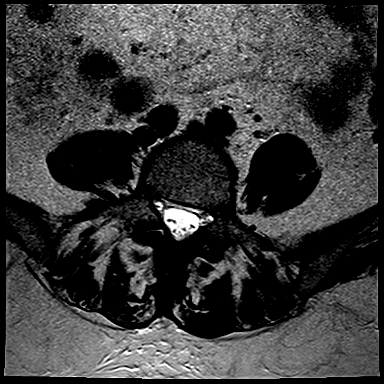
[im 9/23]
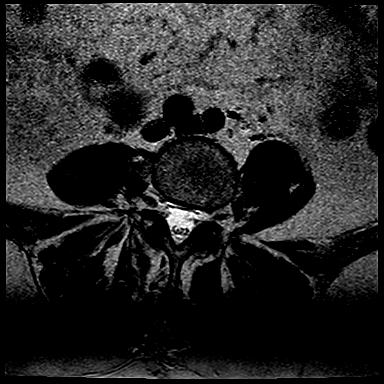
[im 12/23]
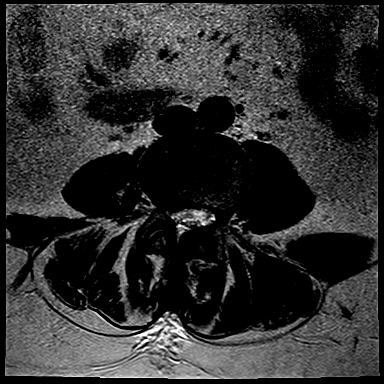
[im 14/23]
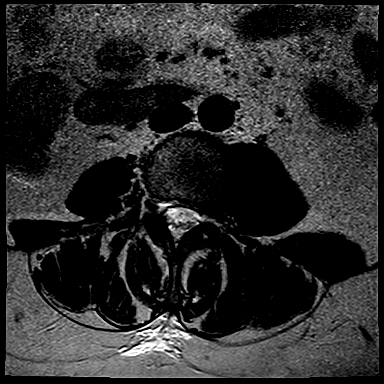
[im 17/23]
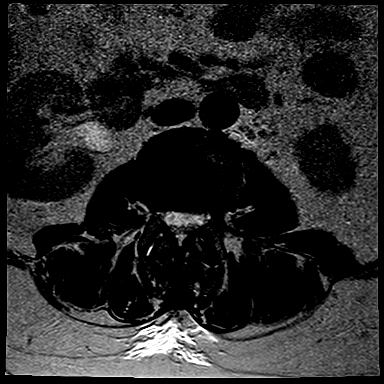
[im 20/23]
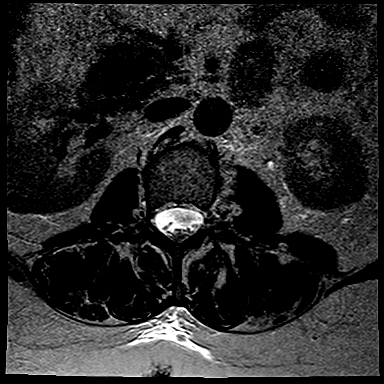
[im 23/23]
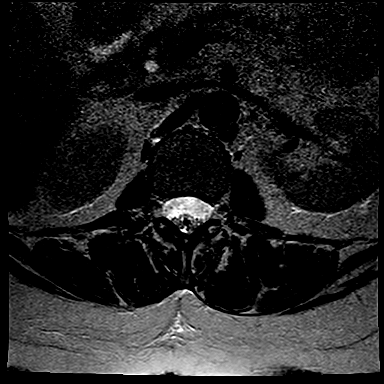

[Series 10: T1 · axial · 4.0mm · 0.47mm/px · z∈[-87,+109]mm · 8 of 23 slices shown (2 of 2)]
[im 1/23]
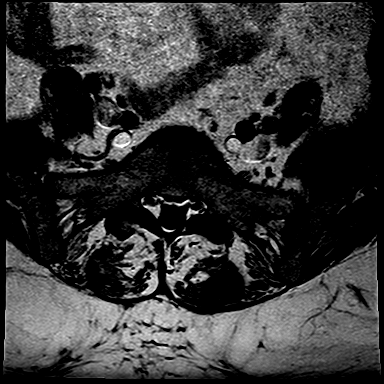
[im 3/23]
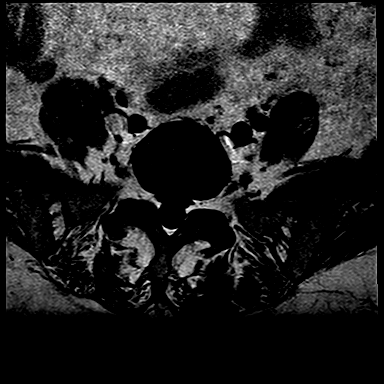
[im 6/23]
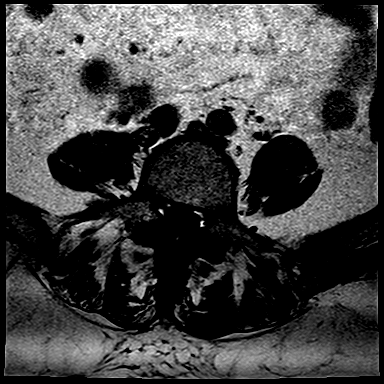
[im 9/23]
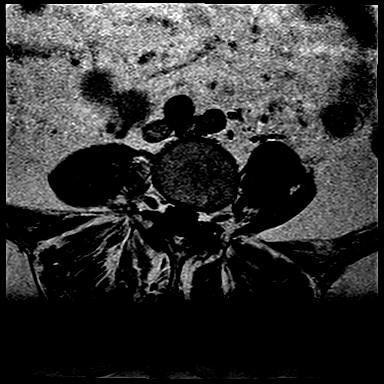
[im 14/23]
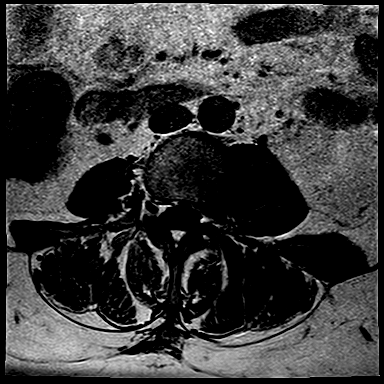
[im 17/23]
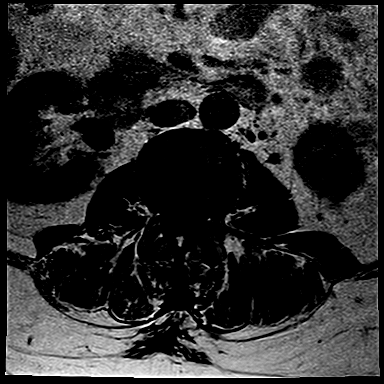
[im 20/23]
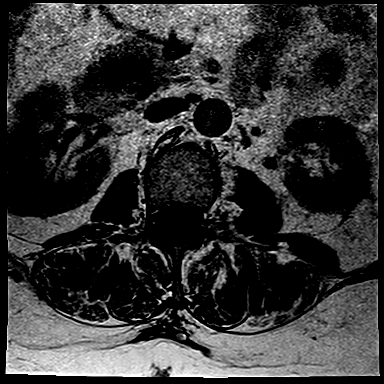
[im 23/23]
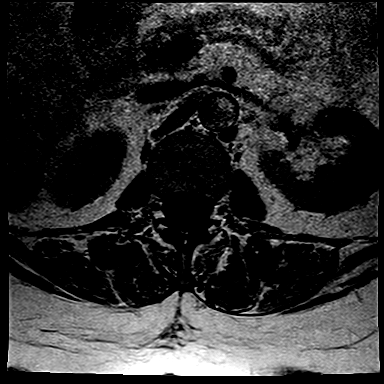

[Series 11: T2 · sagittal · 4.0mm · 0.94mm/px · 5 of 13 slices shown (4 of 4)]
[im 1/13]
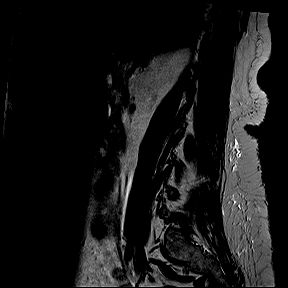
[im 4/13]
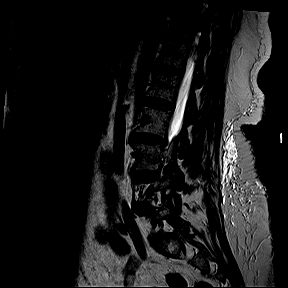
[im 7/13]
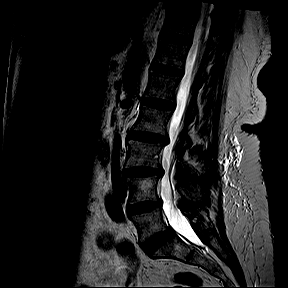
[im 10/13]
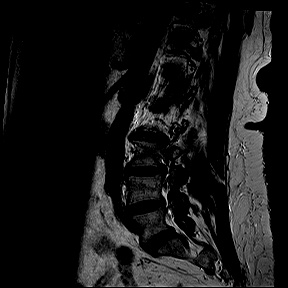
[im 13/13]
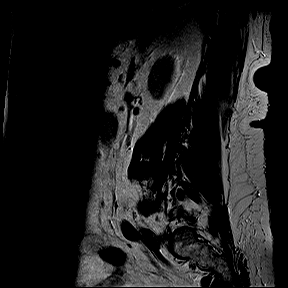

[44 of 48 positions shown; findings below may reference images not displayed]

FINDINGS: Vertebral bodies are normal in height, alignment and signal intensity. There is no acute fracture or subluxation. Distal spinal cord is normal in signal intensity and terminates normally at L1-2 disc space level. Spinal canal is congenitally narrow.

L1-2 level is unremarkable.

At L2-3 level, there is a small broad-based central disc bulge, mildly effacing the ventral thecal sac. There is mild-to-moderate bilateral neural foraminal stenosis from facet arthropathy and bulging annulus.

At L3-4 level, there is a small broad-based central disc bulge resulting in mild-to-moderate spinal stenosis. There is moderate bilateral neural foraminal stenosis from facet arthropathy and bulging annulus.

At L4-5 level, there is a small broad-based central disc bulge, mildly effacing the ventral thecal sac. There is mild left neural foraminal stenosis from facet arthropathy and bulging annulus.

L5-S1 level and paraspinal soft tissues are unremarkable.
IMPRESSION: 1. Small disc bulges at multiple levels with mild-to-moderate spinal stenosis at L3-4 level. 

2. Multilevel neural foraminal stenosis as detailed above.

## 2021-07-11 IMAGING — MR MRI CERVICAL SPINE WITHOUT CONTRAST
4 of 5 series · 23 of 48 positions shown · IV contrast (gadolinium)
Comparison: None available.

﻿EXAM:  [id number]   MRI CERVICAL SPINE WITHOUT CONTRAST
INDICATION: Neck pain, myelopathy.
TECHNIQUE: Multiplanar multisequential MRI of the cervical spine was performed without gadolinium contrast.

[Series 5: T2 · sagittal · 3.0mm · 0.69mm/px · 8 of 15 slices shown (1 of 2)]
[im 1/15]
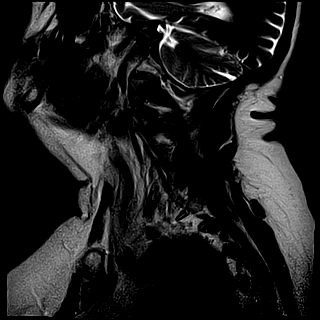
[im 3/15]
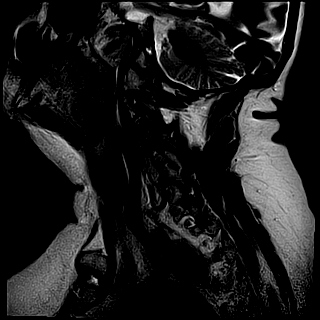
[im 5/15]
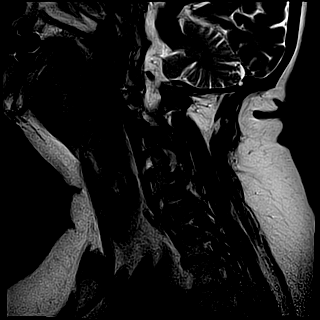
[im 7/15]
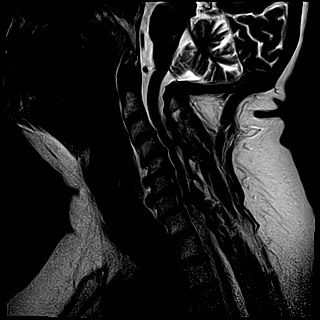
[im 9/15]
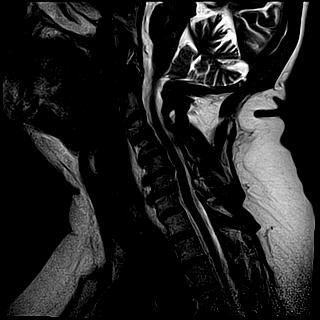
[im 11/15]
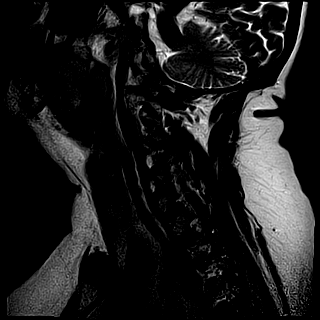
[im 13/15]
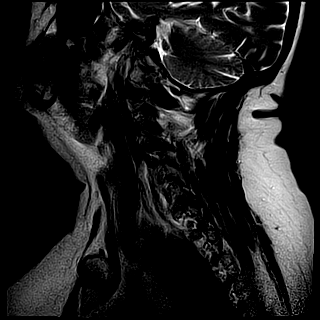
[im 15/15]
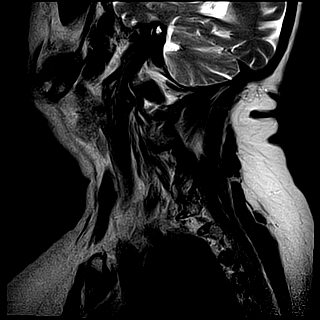

[Series 6: T1 · sagittal · 3.0mm · 0.47mm/px · 3 of 15 slices shown]
[im 2/15]
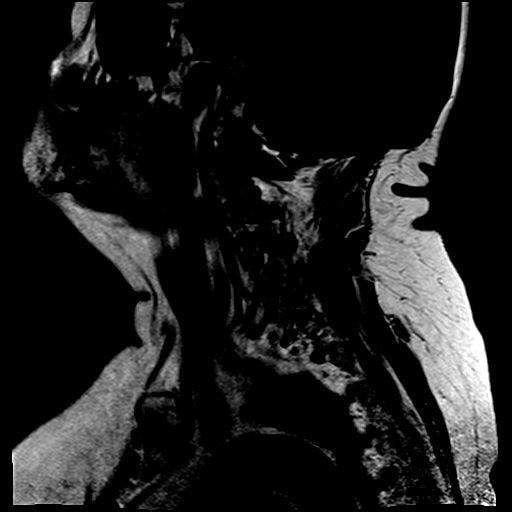
[im 8/15]
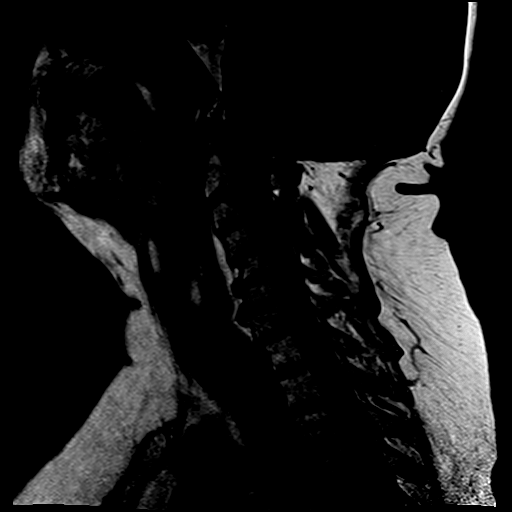
[im 13/15]
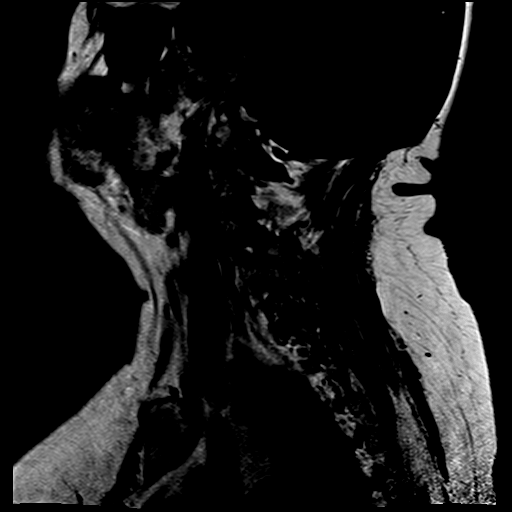

[Series 7: STIR · sagittal · 3.0mm · 0.43mm/px · 3 of 15 slices shown]
[im 2/15]
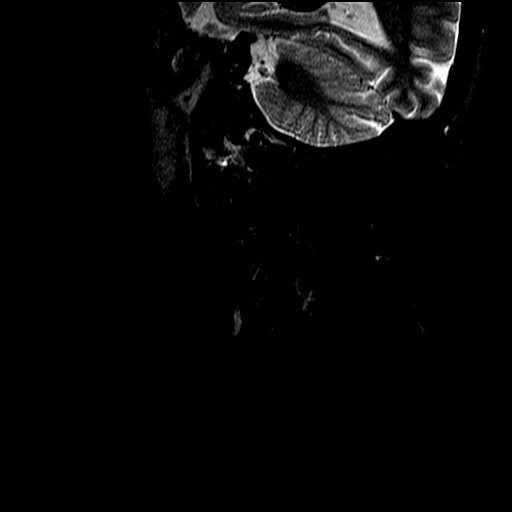
[im 8/15]
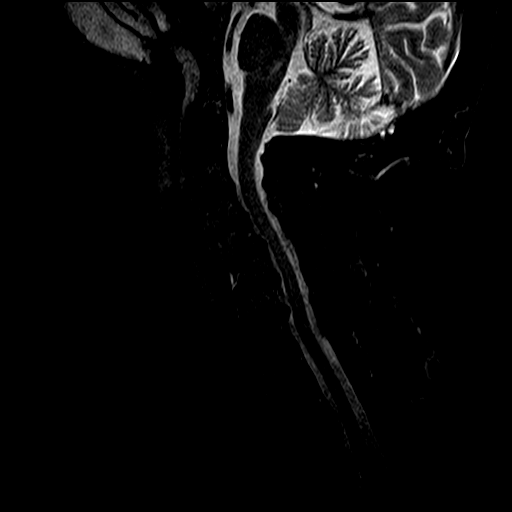
[im 13/15]
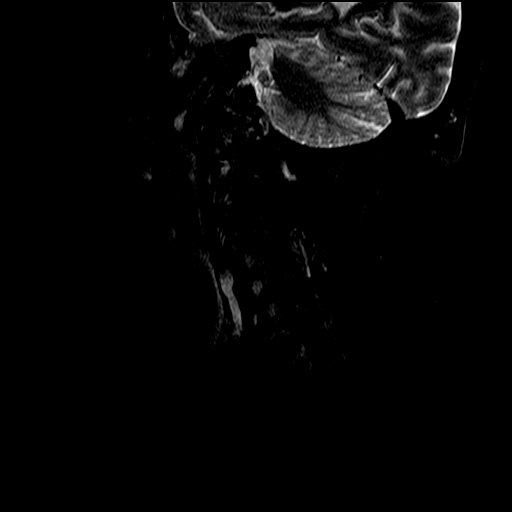

[Series 8: T2 · axial · 3.0mm · 0.39mm/px · z∈[-101,-42]mm · 9 of 18 slices shown (2 of 2)]
[im 1/18]
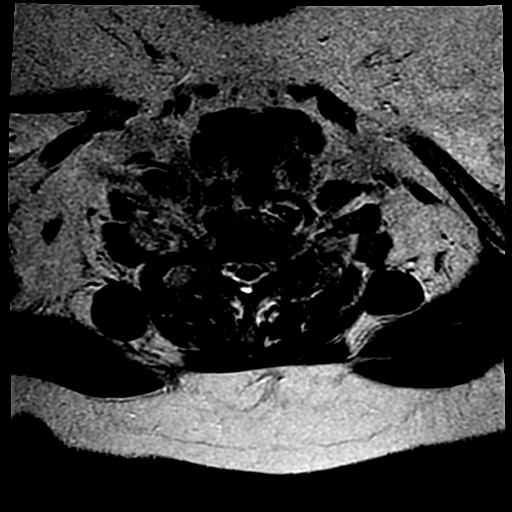
[im 2/18]
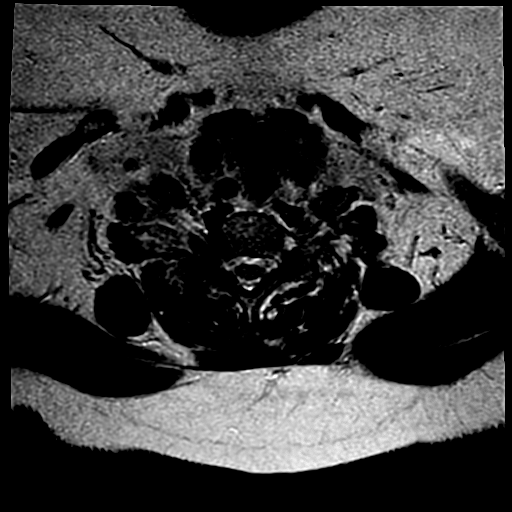
[im 4/18]
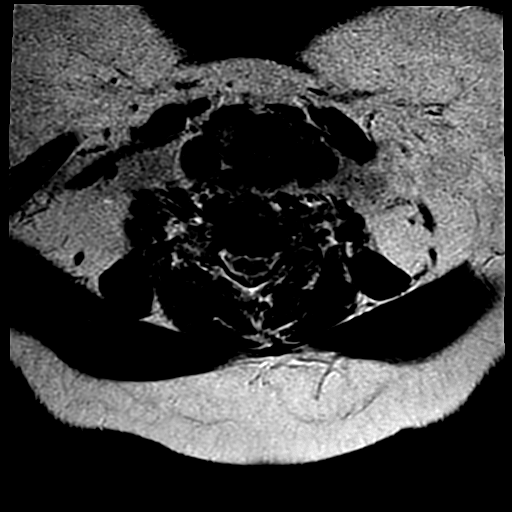
[im 6/18]
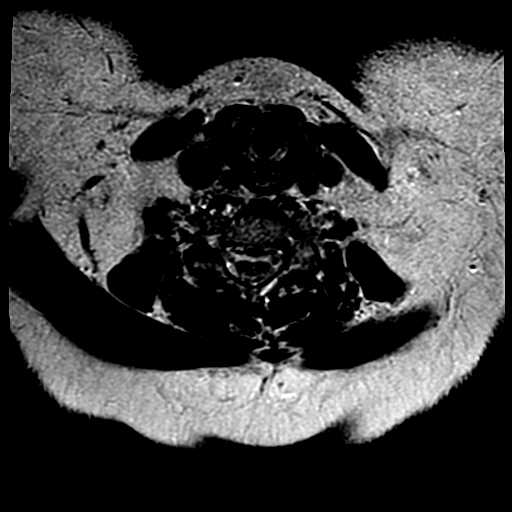
[im 7/18]
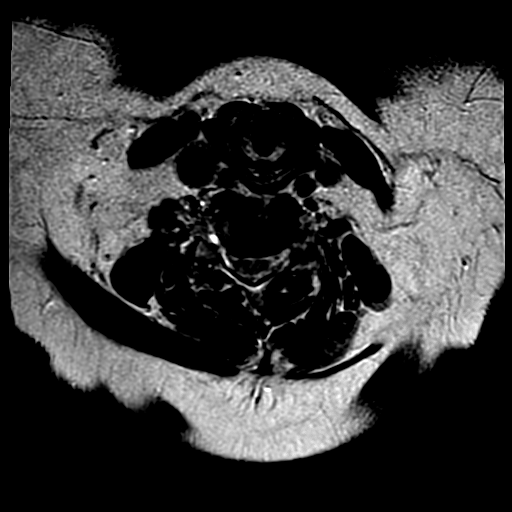
[im 9/18]
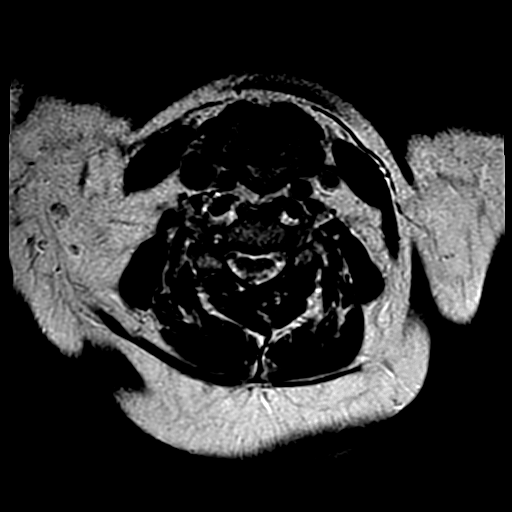
[im 11/18]
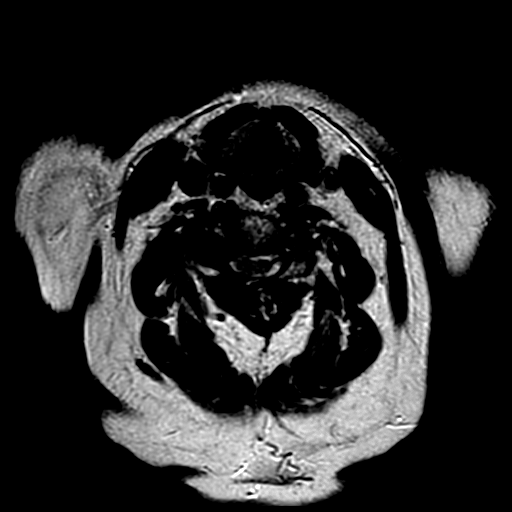
[im 12/18]
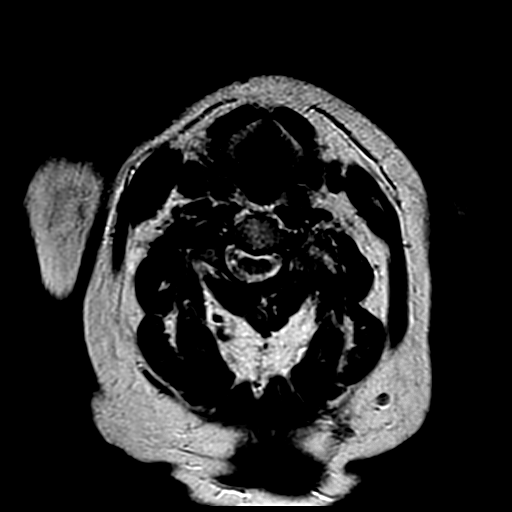
[im 16/18]
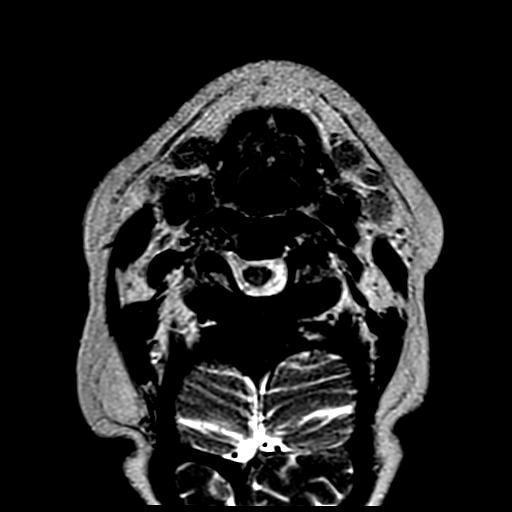

[23 of 48 positions shown; findings below may reference images not displayed]

FINDINGS: Mild reversal of cervical lordosis is likely positional. Bone marrow signal intensity is normal. There is no acute fracture or subluxation. Visualized spinal cord is also normal in signal intensity without evidence of compression at any level.

C2-3 level is unremarkable.

At C3-4 level, there is minimal anterolisthesis of C3 on C4 vertebral body. There is a minimal bulging annulus, minimally effacing the ventral CSF. There is severe left neural foraminal stenosis from facet and uncovertebral joint hypertrophy.

At C4-5 level, there is minimal anterolisthesis of C4 on C5 vertebral body. There is a minimal bulging annulus, minimally effacing the ventral CSF. There is severe right and mild-to-moderate left neural foraminal stenosis from facet and uncovertebral joint hypertrophy.

At C5-6 level, there is a small broad-based central disc osteophyte complex with near complete effacement of the ventral CSF. There is moderate to severe right and mild-to-moderate left neural foraminal stenosis from facet and uncovertebral joint hypertrophy.

At C6-7 level, there is a small broad-based central disc osteophyte complex with near complete effacement of the ventral CSF. There is moderate to severe bilateral neural foraminal stenosis from facet and uncovertebral joint hypertrophy.

C7-T1 level and paraspinal soft tissues are unremarkable.
IMPRESSION: 1. Minimal anterolisthesis of C3 on C4 and C4 on C5 vertebral body. 

2. Small disc osteophyte complexes at C5-6 and C6-7 levels with near complete effacement of the ventral CSF at both levels. 

3. Multilevel neural foraminal stenosis as detailed above.

## 2021-09-24 ENCOUNTER — Other Ambulatory Visit (HOSPITAL_COMMUNITY): Payer: Self-pay

## 2021-09-24 DIAGNOSIS — F0390 Unspecified dementia without behavioral disturbance: Secondary | ICD-10-CM

## 2021-09-26 ENCOUNTER — Ambulatory Visit (HOSPITAL_COMMUNITY): Payer: Self-pay

## 2021-09-30 ENCOUNTER — Other Ambulatory Visit: Payer: Self-pay

## 2021-09-30 ENCOUNTER — Other Ambulatory Visit (HOSPITAL_COMMUNITY): Payer: Self-pay

## 2021-09-30 ENCOUNTER — Encounter (HOSPITAL_COMMUNITY): Payer: Self-pay

## 2021-09-30 ENCOUNTER — Inpatient Hospital Stay
Admission: RE | Admit: 2021-09-30 | Discharge: 2021-09-30 | Disposition: A | Discharge status: Home / Self Care | Payer: Medicare Other | Source: Ambulatory Visit

## 2021-09-30 DIAGNOSIS — F0390 Unspecified dementia without behavioral disturbance: Secondary | ICD-10-CM

## 2021-09-30 HISTORY — DX: Disorder of thyroid, unspecified: E07.9

## 2021-09-30 HISTORY — DX: Shortness of breath: R06.02

## 2021-09-30 HISTORY — DX: Unspecified osteoarthritis, unspecified site: M19.90

## 2021-09-30 HISTORY — DX: Personal history of other venous thrombosis and embolism: Z86.718

## 2021-09-30 HISTORY — DX: Sleep apnea, unspecified: G47.30

## 2021-09-30 HISTORY — DX: Type 2 diabetes mellitus without complications: E11.9

## 2021-09-30 HISTORY — DX: Depression, unspecified: F32.A

## 2021-09-30 HISTORY — DX: Anxiety disorder, unspecified: F41.9

## 2021-09-30 HISTORY — DX: Gastro-esophageal reflux disease without esophagitis: K21.9

## 2021-09-30 HISTORY — DX: Essential (primary) hypertension: I10

## 2021-09-30 HISTORY — DX: History of falling: Z91.81

## 2021-09-30 LAB — PLATELET FUNCTION COLLAGEN: PFA COLLAGEN/EPI RESULT: 145 seconds (ref 84–152)

## 2021-09-30 MED ORDER — LIDOCAINE (PF) 20 MG/ML (2 %) INJECTION SOLUTION
INTRAMUSCULAR | Status: AC
Start: 2021-09-30 — End: 2021-09-30
  Filled 2021-09-30: qty 10

## 2021-09-30 MED ORDER — DIAZEPAM 5 MG TABLET
5.0000 mg | ORAL_TABLET | Freq: Once | ORAL | Status: DC
Start: 2021-09-30 — End: 2021-09-30

## 2021-09-30 MED ORDER — FENTANYL (PF) 50 MCG/ML INJECTION WRAPPER
INJECTION | Freq: Once | INTRAMUSCULAR | Status: AC | PRN
Start: 2021-09-30 — End: 2021-09-30
  Administered 2021-09-30: 10 ug via INTRAVENOUS

## 2021-09-30 MED ORDER — FENTANYL (PF) 50 MCG/ML INJECTION SOLUTION
INTRAMUSCULAR | Status: AC
Start: 2021-09-30 — End: 2021-09-30
  Filled 2021-09-30: qty 4

## 2021-09-30 MED ORDER — MIDAZOLAM 5 MG/ML INJECTION WRAPPER
Freq: Once | INTRAMUSCULAR | Status: AC | PRN
Start: 2021-09-30 — End: 2021-09-30
  Administered 2021-09-30: 1 mg via INTRAVENOUS

## 2021-09-30 MED ORDER — HYDROCODONE 5 MG-ACETAMINOPHEN 325 MG TABLET
1.0000 | ORAL_TABLET | ORAL | Status: DC | PRN
Start: 2021-09-30 — End: 2021-09-30

## 2021-09-30 MED ORDER — MIDAZOLAM 5 MG/ML INJECTION WRAPPER
INTRAMUSCULAR | Status: AC
Start: 2021-09-30 — End: 2021-09-30
  Filled 2021-09-30: qty 2

## 2021-09-30 MED ORDER — HYDROCODONE 5 MG-ACETAMINOPHEN 325 MG TABLET
1.0000 | ORAL_TABLET | ORAL | Status: DC | PRN
Start: 2021-09-30 — End: 2021-10-01
  Administered 2021-09-30: 1 via ORAL
  Filled 2021-09-30: qty 1

## 2021-09-30 MED ORDER — SODIUM CHLORIDE 0.9 % INTRAVENOUS SOLUTION
INTRAVENOUS | Status: DC
Start: 2021-09-30 — End: 2021-10-01
  Administered 2021-09-30: 1000 mL via INTRAVENOUS

## 2021-09-30 MED ORDER — LIDOCAINE HCL 20 MG/ML (2 %) INJECTION SOLUTION
Freq: Once | INTRAMUSCULAR | Status: AC | PRN
Start: 2021-09-30 — End: 2021-09-30
  Administered 2021-09-30: 2 mL via INTRADERMAL

## 2021-09-30 NOTE — Discharge Instructions (Addendum)
Follow up with your primary care doctor as needed    You may shower tomorrow    Leave band aid on for 24 hours then change it  as needed    No strenuous activity for 2-3 days.

## 2021-10-10 LAB — QUEST MISC ORDER - FROZEN

## 2021-10-11 ENCOUNTER — Encounter (HOSPITAL_COMMUNITY): Payer: Self-pay | Admitting: NURSE PRACTITIONER

## 2021-10-11 ENCOUNTER — Emergency Department
Admission: EM | Admit: 2021-10-11 | Discharge: 2021-10-11 | Disposition: A | Discharge status: Home / Self Care | Payer: Medicare Other | Attending: NURSE PRACTITIONER | Admitting: NURSE PRACTITIONER

## 2021-10-11 ENCOUNTER — Other Ambulatory Visit: Payer: Self-pay

## 2021-10-11 ENCOUNTER — Inpatient Hospital Stay (HOSPITAL_BASED_OUTPATIENT_CLINIC_OR_DEPARTMENT_OTHER): Payer: Medicare Other

## 2021-10-11 DIAGNOSIS — J449 Chronic obstructive pulmonary disease, unspecified: Secondary | ICD-10-CM | POA: Insufficient documentation

## 2021-10-11 DIAGNOSIS — M25472 Effusion, left ankle: Secondary | ICD-10-CM

## 2021-10-11 DIAGNOSIS — Z86718 Personal history of other venous thrombosis and embolism: Secondary | ICD-10-CM | POA: Insufficient documentation

## 2021-10-11 DIAGNOSIS — M25471 Effusion, right ankle: Secondary | ICD-10-CM

## 2021-10-11 DIAGNOSIS — Z8709 Personal history of other diseases of the respiratory system: Secondary | ICD-10-CM

## 2021-10-11 DIAGNOSIS — R0600 Dyspnea, unspecified: Secondary | ICD-10-CM

## 2021-10-11 DIAGNOSIS — M25572 Pain in left ankle and joints of left foot: Secondary | ICD-10-CM

## 2021-10-11 NOTE — ED APP Handoff Note (Deleted)
[facility]  Emergency Department  Provider in Triage Note    Name: KIYONNA TORTORELLI  Age: 76 y.o.  Gender: female     Subjective:   FLOETTA BRICKEY is a 76 y.o. female who presents with complaint of Edema  .  Patient reports swelling to left ankle since yesterday. Denies injury, trauma or fall. Pain with weightbearing. 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 Patient also c/o occasional dyspnea, stared this morning. Has used maintenance inhaler and rescue inhaler today, which has helped.     Objective:   Filed Vitals:    10/11/21 1612   BP: 139/62   Pulse: 75   Resp: 20   Temp: 36.9 C (98.5 F)   SpO2: 95%      Focused Physical Exam shows swelling to medial side of ankle. Lung sounds clear, bilaterally. Pulse ox 95% on room air.     Assessment:  A medical screening exam was completed.      Plan:  Please see initial orders and work-up below.  This is to be continued with full evaluation in the main Emergency Department.     No current facility-administered medications for this encounter.     No results found for this or any previous visit (from the past 24 hour(s)).     Sherlie Ban, FNP-BC  10/11/2021, 16:10

## 2021-10-11 NOTE — ED Provider Notes (Signed)
[facility]  Emergency Department  Advanced Practice Provider Note      CHIEF COMPLAINT  Chief Complaint   Patient presents with   . Edema     HISTORY OF PRESENT ILLNESS  Kathleen Munoz, date of birth 07/24/1945, is a 76 y.o. female who presented to the Emergency Department.    .  Patient reports swelling to left ankle since yesterday. Denies injury, trauma or fall. Pain with weightbearing. 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 Patient also c/o occasional dyspnea, stared this morning. Has used maintenance inhaler and rescue inhaler today, which has helped.     ROS  No other overt positive review of systems are noted other than stated in the HPI.       PAST MEDICAL/SURGICAL/FAMILY/SOCIAL HISTORY  Past Medical History:   Diagnosis Date   . Anxiety    . Arthritis    . Depression    . Diabetes mellitus, type 2 (CMS HCC)    . Esophageal reflux    . H/O blood clots     LUNGS   . Hypertension    . Personal history of fall    . Shortness of breath    . Sleep apnea     CPAP   . Thyroid disease        Past Surgical History:   Procedure Laterality Date   . HAND SURGERY Left    . HX BUNIONECTOMY     . HX TUBAL LIGATION         Family Medical History:    None       Social History     Socioeconomic History   . Marital status: Widowed   Tobacco Use   . Smoking status: Never   . Smokeless tobacco: Never   Substance and Sexual Activity   . Alcohol use: Never   . Drug use: Never      ALLERGIES  Allergies   Allergen Reactions   . Influenza Virus Vaccines        PHYSICAL EXAM  VITAL SIGNS:  Filed Vitals:    10/11/21 1612   BP: 139/62   Pulse: 75   Resp: 20   Temp: 36.9 C (98.5 F)   SpO2: 95%     Constitutional: Alert and oriented. Well appearing. Average body weight. No distress noted.   Cardiovascular: RRR, S1, S2. Intact distal pulses.  Pulmonary/Chest: Breath sounds clear and equal bilaterally. No respiratory distress. No wheezes, rales or chest tenderness.               Musculoskeletal: No edema, tenderness or deformity. Normal muscle tone and strength.   Skin: warm and dry. No rash, redness, bruising, abrasion, laceration  Psychiatric: normal mood and affect. Behavior is normal.   Neurological: Alert, oriented. Normal gait. No focal weakness noted. No sensory deficit          DIAGNOSTICS  Labs:  Labs listed below were reviewed and interpreted by me.  No results found for any visits on 10/11/21.  Radiology:  Results for orders placed or performed during the hospital encounter of 10/11/21   XR ANKLE LEFT     Status: None    Narrative    Sheretha S Liberati    RADIOLOGIST: Karolee Stamps    XR ANKLE LEFT performed on 10/11/2021 4:44 PM    CLINICAL HISTORY: L ankle pain/medial swelling.  LEFT ANKLE PAIN, NO KNOWN INJURY    TECHNIQUE:  3 views of the left  ankle    COMPARISON:  None.    FINDINGS:   No acute osseous abnormality is identified.  No joint dislocation.  No radiopaque foreign body identified.        Impression    No acute osseous abnormality.             Radiologist location ID: [id number]     XR CHEST PA AND LATERAL     Status: None    Narrative    Nathalya S Mikami    RADIOLOGIST: Karolee Stamps    XR CHEST PA AND LATERAL performed on 10/11/2021 4:43 PM    CLINICAL HISTORY: Dyspnea.  DYSPNEA    TECHNIQUE: Frontal and lateral views of the chest.    COMPARISON:  02/08/2021    FINDINGS:  Lungs remain symmetrically inflated with degree of hyperinflation again seen suggestive of underlying COPD.     Cardiac silhouette remains normal in size stable from the prior study.  Bronchovascular lung markings are not significantly changed.  No focal airspace opacity, pleural effusion or pneumothorax is identified.   Degenerative changes are identified within the thoracic spine.          Impression    NO ACUTE FINDINGS.      Radiologist location ID: [id number]         ED COURSE/MEDICAL DECISION MAKING          Medical Decision Making  .  Patient reports swelling to left ankle since  yesterday. Denies injury, trauma or fall. Pain with weightbearing. 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 Patient also c/o occasional dyspnea, stared this morning. Has used maintenance inhaler and rescue inhaler today, which has helped.     Patient's list of differential diagnosis includes but is not limited to gout, DVT, sprain, strain, LA, RA, sprain, strain, fracture, dislocation, COPD, asthma, flu, COVID, pneumonia, MI.    See radiology report in note.     Following the above history, physical exam, and findings- the patient was deemed stable and suitable for discharge.  Discharge and medication instructions were discussed with the patient and all questions were addressed.  The patient understands that they may return to the ED at any time for new or worsening symptoms or if they have any other concerns.  The patient is to follow up with family doctor . Patient verbalized understanding and agrees to plan of care    Amount and/or Complexity of Data Reviewed  Radiology: ordered.        CLINICAL IMPRESSION  Clinical Impression   Swelling of ankle, left (Primary)   Dyspnea, unspecified type   History of COPD     DISPOSITION  Discharged       DISCHARGE MEDICATIONS  Discharge Medication List as of 10/11/2021  5:32 PM          Sherlie Ban, FNP-C 10/11/2021, 18:26   [facility]  Department of Emergency Medicine  [facility]    This note was partially generated using MModal Fluency Direct system, and there may be some incorrect words, spellings, and punctuation that were not noted in checking the note before saving.    -----

## 2021-10-11 NOTE — ED Nurses Note (Signed)
Patient seen, treated, and discharged by ER provider prior to being assigned to a primary nurse. No primary nursing assessment done.

## 2021-10-11 NOTE — Discharge Instructions (Signed)
Today in the ED, we completed a workup for your complaint of ankle swelling and dyspnea. The findings on your exam today and on your imaging is reassuring. Your workup did not show any emergent findings requiring hospitalization.    Sometimes, we do not always find the cause for your symptoms in one ER visit.  At this time, it is not 100% certain what is causing your symptoms, but we feel you can be discharged from the Emergency Department.     It is possible this may worsen or get better. Please, if you get worse or your symptoms change, return to ED. Otherwise, we strongly encourage you to follow up with primary care office within 24-48 hours or any of the specialists we have recommended.     Thank you for allowing [place] to be part of your care.      Please ensure all questions or concerns are addressed prior to leaving the hospital. We want to make sure your concerns are addressed to make sure you are as safe and healthy as possible. By leaving the hospital, it is understood you are in agreement with your treatment plan.    Hopefully your primary care physician has access to the Epic system and will be able to see your results. However, if not, please call the hospital medical records office for a copy of your finalized results, and review them with a primary care physician, for any findings needing further attention.    We encourage you to see your regular doctor as soon as possible to let them know you were seen in the emergency department. They may want to do further testing. If you do not have a doctor, a list of accepting providers can be given to you.

## 2021-10-11 NOTE — ED Nurses Note (Signed)
Patient discharged home.  AVS reviewed with patient.  A written copy of the AVS and discharge instructions was given to the patient.  Questions sufficiently answered as needed.  Patient encouraged to follow up with PCP as indicated.  In the event of an emergency, patient instructed to call 911 or go to the nearest emergency room.

## 2021-10-11 NOTE — ED Triage Notes (Signed)
L ankle swelling since yesterday; discoloration and discomfort when walking, denies injury     Hx of DVT

## 2021-10-24 ENCOUNTER — Other Ambulatory Visit: Payer: Self-pay

## 2021-10-24 ENCOUNTER — Ambulatory Visit (INDEPENDENT_AMBULATORY_CARE_PROVIDER_SITE_OTHER): Payer: Medicare Other | Admitting: NURSE PRACTITIONER

## 2021-10-24 ENCOUNTER — Encounter (INDEPENDENT_AMBULATORY_CARE_PROVIDER_SITE_OTHER): Payer: Self-pay | Admitting: NURSE PRACTITIONER

## 2021-10-24 VITALS — Resp 18 | Ht 64.0 in | Wt 208.0 lb

## 2021-10-24 DIAGNOSIS — R42 Dizziness and giddiness: Secondary | ICD-10-CM

## 2021-10-24 DIAGNOSIS — J3089 Other allergic rhinitis: Secondary | ICD-10-CM

## 2021-10-24 MED ORDER — IPRATROPIUM BROMIDE 21 MCG (0.03 %) NASAL SPRAY
2.0000 | Freq: Two times a day (BID) | NASAL | 12 refills | Status: DC | PRN
Start: 2021-10-24 — End: 2021-11-26

## 2021-10-24 NOTE — Progress Notes (Signed)
[facility], [facility]  [address]  [place] [address]  Phone: [phone]  Fax: [phone]      Encounter Date: 10/24/2021    Patient ID: Kathleen Munoz  MRN: [id number]    DOB: Mar 05, 1946  Age: 76 y.o. female     Progress Note       Referring Provider:  Madolyn Frieze, MD    Reason for Visit:   Chief Complaint   Patient presents with   . Follow Up 6 Months     Patient here for 6 month follow up on ears. No problems        History of Present Illness:  Kathleen Munoz is a 76 y.o. female follow up Allergic Rhinitis and dizziness.  She has also seen Dr. Aretha Parrot for the dizziness.  She continues to have episodes of feeling off balance.  She is continuing to have PND and nasal drainage.  No changes with seasons.  She wears CPAP nightly and thinks this is causing the drainage.      Patient History:  There is no problem list on file for this patient.    Current Outpatient Medications   Medication Sig   . aspirin 81 mg Oral Tablet, Chewable Chew 1 Tablet (81 mg total) Once a day   . buPROPion (WELLBUTRIN) 100 mg Oral Tablet Take 1 Tablet (100 mg total) by mouth Twice daily   . busPIRone (BUSPAR) 10 mg Oral Tablet Take 2 Tablets (20 mg total) by mouth Twice daily   . DULoxetine (CYMBALTA DR) 60 mg Oral Capsule, Delayed Release(E.C.) Take 1 Capsule (60 mg total) by mouth Once a day   . fexofenadine (ALLEGRA) 180 mg Oral Tablet Take 1 Tablet (180 mg total) by mouth Once a day   . gabapentin (NEURONTIN) 300 mg Oral Capsule Take by mouth   . ipratropium bromide (ATROVENT) 21 mcg (0.03 %) Nasal nasal spray Administer 2 Sprays into affected nostril(s) Twice per day as needed   . levothyroxine (SYNTHROID) 100 mcg Oral Tablet Take 1 Tablet (100 mcg total) by mouth Every morning   . lisinopriL (PRINIVIL) 10 mg Oral Tablet Take 12.5 mg by mouth Once a day   . metoprolol tartrate (LOPRESSOR) 25 mg Oral Tablet Take by mouth Twice daily   . omeprazole (PRILOSEC) 40 mg Oral Capsule, Delayed Release(E.C.) Take 1 Capsule  (40 mg total) by mouth Once a day   . SITagliptin phosphate (JANUVIA) 100 mg Oral Tablet Take by mouth Once a day   . traZODone (DESYREL) 150 mg Oral Tablet Take 200 mg by mouth Every night      Allergies   Allergen Reactions   . Influenza Virus Vaccines      Past Medical History:   Diagnosis Date   . Anxiety    . Arthritis    . Depression    . Diabetes mellitus, type 2 (CMS HCC)    . Esophageal reflux    . H/O blood clots     LUNGS   . Hypertension    . Personal history of fall    . Shortness of breath    . Sleep apnea     CPAP   . Thyroid disease      Past Surgical History:   Procedure Laterality Date   . HAND SURGERY Left    . HX BUNIONECTOMY     . HX TUBAL LIGATION       Family Medical History:    None  Social History     Tobacco Use   . Smoking status: Never   . Smokeless tobacco: Never   Substance Use Topics   . Alcohol use: Never   . Drug use: Never       Review of Systems   Constitutional: Negative.    HENT: Positive for congestion, postnasal drip and rhinorrhea.    Respiratory: Negative.    Allergic/Immunologic: Positive for environmental allergies.   Neurological: Positive for dizziness.   Hematological: Negative.         Physical Exam:  Resp 18   Ht 1.626 m (5\' 4" )   Wt 94.3 kg (208 lb)   BMI 35.70 kg/m       GENERAL APPEARANCE  Obese  MOOD AND AFFECT  Normal  COMMUNICATION  Normal  NEURO EXAM  Cranial II-XII intact, grossly intact and alert and oriented x3  HEAD & NECK  Lesions:  No  Scars:  No  Normal parotid gland:  Yes  Normal submandibular gland:  Yes  Neck mass:  No  Neck tenderness:  No  Lymphadenopathy:  No  Thyroid normal:  Yes  NOSE  Allergic nasal mucosa  ORAL CAVITY  Normal gums, lips, teeth:  Yes  Tonsils: Absent  Oropharynx normal:  Yes  RIGHT EAR  Normal to inspection:  Yes  Tympanic membrane:  Intact  External ears normal:  Yes  Internal canal normal:  Normal  LEFT EAR  Normal to inspection:  Yes  Tympanic membrane:  Intact  External ears normal:  Yes  Internal canal normal:   Normal    Assessment:  ENCOUNTER DIAGNOSES     ICD-10-CM   1. Non-seasonal allergic rhinitis, unspecified trigger  J30.89   2. Dizziness  R42       Plan:  Medical records reviewed on 10/24/2021.  Atrovent nasal spray TID prn for nasal drainage  If no improvement with Atrovent will change antihistamine  She is following up with Dr. 10/26/2021 for dizziness.  She was given a copy of vestibular report to take to the next appointment with him.      Orders Placed This Encounter   . ipratropium bromide (ATROVENT) 21 mcg (0.03 %) Nasal nasal spray     Return in about 6 months (around 04/25/2022).    04/27/2022, FNP-BC  10/24/2021, 13:56

## 2021-11-17 ENCOUNTER — Emergency Department (HOSPITAL_COMMUNITY): Payer: Medicare Other

## 2021-11-17 ENCOUNTER — Inpatient Hospital Stay (HOSPITAL_COMMUNITY): Payer: Medicare Other | Admitting: Emergency Medicine

## 2021-11-17 ENCOUNTER — Inpatient Hospital Stay
Admission: EM | Admit: 2021-11-17 | Discharge: 2021-11-19 | DRG: 176 | Disposition: A | Discharge status: Home Health | Payer: Medicare Other | Attending: Internal Medicine | Admitting: Internal Medicine

## 2021-11-17 ENCOUNTER — Other Ambulatory Visit: Payer: Self-pay

## 2021-11-17 ENCOUNTER — Encounter (HOSPITAL_COMMUNITY): Payer: Self-pay | Admitting: Family

## 2021-11-17 DIAGNOSIS — R059 Cough, unspecified: Secondary | ICD-10-CM

## 2021-11-17 DIAGNOSIS — Z87891 Personal history of nicotine dependence: Secondary | ICD-10-CM

## 2021-11-17 DIAGNOSIS — I10 Essential (primary) hypertension: Secondary | ICD-10-CM | POA: Diagnosis present

## 2021-11-17 DIAGNOSIS — Z794 Long term (current) use of insulin: Secondary | ICD-10-CM

## 2021-11-17 DIAGNOSIS — E079 Disorder of thyroid, unspecified: Secondary | ICD-10-CM | POA: Diagnosis present

## 2021-11-17 DIAGNOSIS — Z9989 Dependence on other enabling machines and devices: Secondary | ICD-10-CM

## 2021-11-17 DIAGNOSIS — G4730 Sleep apnea, unspecified: Secondary | ICD-10-CM | POA: Diagnosis present

## 2021-11-17 DIAGNOSIS — M1990 Unspecified osteoarthritis, unspecified site: Secondary | ICD-10-CM | POA: Diagnosis present

## 2021-11-17 DIAGNOSIS — Z7984 Long term (current) use of oral hypoglycemic drugs: Secondary | ICD-10-CM

## 2021-11-17 DIAGNOSIS — I2699 Other pulmonary embolism without acute cor pulmonale: Principal | ICD-10-CM | POA: Diagnosis present

## 2021-11-17 DIAGNOSIS — Z86711 Personal history of pulmonary embolism: Secondary | ICD-10-CM

## 2021-11-17 DIAGNOSIS — K219 Gastro-esophageal reflux disease without esophagitis: Secondary | ICD-10-CM | POA: Diagnosis present

## 2021-11-17 DIAGNOSIS — G4733 Obstructive sleep apnea (adult) (pediatric): Secondary | ICD-10-CM | POA: Diagnosis present

## 2021-11-17 DIAGNOSIS — Z79890 Hormone replacement therapy (postmenopausal): Secondary | ICD-10-CM

## 2021-11-17 DIAGNOSIS — L03115 Cellulitis of right lower limb: Secondary | ICD-10-CM

## 2021-11-17 DIAGNOSIS — F419 Anxiety disorder, unspecified: Secondary | ICD-10-CM | POA: Diagnosis present

## 2021-11-17 DIAGNOSIS — E119 Type 2 diabetes mellitus without complications: Secondary | ICD-10-CM | POA: Diagnosis present

## 2021-11-17 DIAGNOSIS — T25011A Burn of unspecified degree of right ankle, initial encounter: Secondary | ICD-10-CM | POA: Diagnosis present

## 2021-11-17 DIAGNOSIS — L02415 Cutaneous abscess of right lower limb: Secondary | ICD-10-CM | POA: Diagnosis present

## 2021-11-17 DIAGNOSIS — Z20822 Contact with and (suspected) exposure to covid-19: Secondary | ICD-10-CM | POA: Diagnosis present

## 2021-11-17 DIAGNOSIS — X16XXXA Contact with hot heating appliances, radiators and pipes, initial encounter: Secondary | ICD-10-CM | POA: Diagnosis present

## 2021-11-17 DIAGNOSIS — Z7982 Long term (current) use of aspirin: Secondary | ICD-10-CM

## 2021-11-17 HISTORY — DX: Cellulitis of right lower limb: L03.115

## 2021-11-17 LAB — CBC WITH DIFF
HCT: 37.9 % (ref 37.0–47.0)
HGB: 13.3 g/dL (ref 12.5–16.0)
MCH: 30 pg (ref 27.0–32.0)
MCHC: 35.2 g/dL (ref 32.0–36.0)
MCV: 85.5 fL (ref 78.0–99.0)
MPV: 8.1 fL (ref 7.4–10.4)
PLATELETS: 290 10*3/uL (ref 140–440)
RBC: 4.44 10*6/uL (ref 4.20–5.40)
RDW: 13.6 % (ref 11.6–14.8)
WBC: 11.1 10*3/uL — ABNORMAL HIGH (ref 4.0–10.5)
WBCS UNCORRECTED: 11.1 10*3/uL

## 2021-11-17 LAB — MANUAL DIFFERENTIAL
BAND %: 1 % — ABNORMAL LOW (ref 5–11)
BANDS NEUTROPHILS MANUAL: 1
EOSINOPHIL %: 1 % (ref 0–7)
EOSINOPHIL ABSOLUTE: 0.11 10*3/uL (ref 0.00–0.80)
EOSINOPHILS MANUAL: 1
LYMPHOCYTE %: 28 % (ref 25–45)
LYMPHOCYTE ABSOLUTE: 3.11 10*3/uL (ref 1.10–5.00)
LYMPHOCYTES MANUAL: 28
METAMYELOCYTE %: 1 %
METAMYELOCYTE ABSOLUTE: 0.11 10*3/uL
METAMYELOCYTES MANUAL: 1
MONOCYTE %: 5 % (ref 0–12)
MONOCYTE ABSOLUTE: 0.56 10*3/uL (ref 0.00–1.30)
MONOCYTES MANUAL: 5
MYELOCYTE %: 4 %
MYELOCYTE ABSOLUTE: 0.44 10*3/uL
MYELOCYTES MANUAL: 4
NEUTROPHIL %: 60 % (ref 40–76)
NEUTROPHIL ABSOLUTE: 6.77 10*3/uL (ref 1.80–8.40)
NEUTROPHILS MANUAL: 60
PLATELET MORPHOLOGY COMMENT: NORMAL
RBC MORPHOLOGY COMMENT: NORMAL
TOTAL CELLS COUNTED [#] IN BLOOD: 100
WBC: 11.1 10*3/uL

## 2021-11-17 LAB — ECG 12 LEAD
Atrial Rate: 80 {beats}/min
Calculated P Axis: 84 degrees
Calculated R Axis: -31 degrees
Calculated T Axis: 76 degrees
PR Interval: 188 ms
QRS Duration: 92 ms
QT Interval: 388 ms
QTC Calculation: 447 ms
Ventricular rate: 80 {beats}/min

## 2021-11-17 LAB — COMPREHENSIVE METABOLIC PANEL, NON-FASTING
ALBUMIN/GLOBULIN RATIO: 1.4 (ref 0.8–1.4)
ALBUMIN: 4.2 g/dL (ref 3.5–5.7)
ALKALINE PHOSPHATASE: 57 U/L (ref 34–104)
ALT (SGPT): 22 U/L (ref 7–52)
ANION GAP: 7 mmol/L — ABNORMAL LOW (ref 10–20)
AST (SGOT): 18 U/L (ref 13–39)
BILIRUBIN TOTAL: 0.3 mg/dL (ref 0.3–1.2)
BUN/CREA RATIO: 18 (ref 6–22)
BUN: 17 mg/dL (ref 7–25)
CALCIUM, CORRECTED: 9.5 mg/dL (ref 8.9–10.8)
CALCIUM: 9.7 mg/dL (ref 8.6–10.3)
CHLORIDE: 103 mmol/L (ref 98–107)
CO2 TOTAL: 28 mmol/L (ref 21–31)
CREATININE: 0.92 mg/dL (ref 0.60–1.30)
ESTIMATED GFR: 65 mL/min/{1.73_m2} (ref >59)
GLOBULIN: 3.1 (ref 2.9–5.4)
GLUCOSE: 108 mg/dL (ref 74–109)
OSMOLALITY, CALCULATED: 278 mOsm/kg (ref 270–290)
POTASSIUM: 4 mmol/L (ref 3.5–5.1)
PROTEIN TOTAL: 7.3 g/dL (ref 6.4–8.9)
SODIUM: 138 mmol/L (ref 136–145)

## 2021-11-17 LAB — COVID-19, FLU A/B, RSV RAPID BY PCR
INFLUENZA VIRUS TYPE A: NOT DETECTED
INFLUENZA VIRUS TYPE B: NOT DETECTED
RESPIRATORY SYNCTIAL VIRUS (RSV): NOT DETECTED
SARS-CoV-2: NOT DETECTED

## 2021-11-17 LAB — POC BLOOD GAS - CG4 (ARTERIAL - RESULTS)
BASE EXCESS (BEP): 3 mEq/L — ABNORMAL HIGH (ref <=2.0)
HCO3 (HCO3P): 27 mmol/L — ABNORMAL HIGH (ref 22–26)
LACTATE, POC: 1.12 mmol/L
PCO2 (PCO2P): 36 mmHg (ref 35–45)
PH (PHP): 7.49 — ABNORMAL HIGH (ref 7.35–7.45)
PO2 (PO2P): 57 mmHg — ABNORMAL LOW (ref 80–100)
SO2 (SO2P): 92 % (ref 92–100)
TCO2 (TOC2P): 28 mmol/L (ref 23–29)

## 2021-11-17 LAB — PTT (PARTIAL THROMBOPLASTIN TIME)
APTT: 33.8 seconds (ref 26.0–36.0)
APTT: 140.2 seconds (ref 26.0–36.0)
APTT: 43.2 seconds — ABNORMAL HIGH (ref 26.0–36.0)

## 2021-11-17 LAB — POC BLOOD GLUCOSE (RESULTS): GLUCOSE, POC: 117 mg/dl (ref 50–500)

## 2021-11-17 LAB — B-TYPE NATRIURETIC PEPTIDE: BNP: 109 pg/mL — ABNORMAL HIGH (ref 5–100)

## 2021-11-17 LAB — TROPONIN-I: TROPONIN I: 9 ng/L (ref <15)

## 2021-11-17 LAB — PT/INR
INR: 1.07 (ref <=5.00)
PROTHROMBIN TIME: 12.4 seconds (ref 9.8–12.7)

## 2021-11-17 LAB — LACTIC ACID LEVEL W/ REFLEX FOR LEVEL >2.0: LACTIC ACID: 1.3 mmol/L (ref 0.5–2.2)

## 2021-11-17 MED ORDER — ALBUTEROL SULFATE 2.5 MG/3 ML (0.083 %) SOLUTION FOR NEBULIZATION
2.5000 mg | INHALATION_SOLUTION | RESPIRATORY_TRACT | Status: DC | PRN
Start: 2021-11-17 — End: 2021-11-19

## 2021-11-17 MED ORDER — HEPARIN (PORCINE) 25,000 UNIT/250 ML IN 0.45 % SODIUM CHLORIDE IV SOLN
18.0000 [IU]/kg/h | INTRAVENOUS | Status: DC
Start: 2021-11-17 — End: 2021-11-18
  Administered 2021-11-17: 0 [IU]/kg/h via INTRAVENOUS
  Administered 2021-11-17: 18 [IU]/kg/h via INTRAVENOUS
  Administered 2021-11-17: 14 [IU]/kg/h via INTRAVENOUS
  Administered 2021-11-18: 0 [IU]/kg/h via INTRAVENOUS
  Filled 2021-11-17: qty 250

## 2021-11-17 MED ORDER — GLUCAGON 1 MG/ML SOLUTION FOR INJECTION
1.0000 mg | INTRAMUSCULAR | Status: DC | PRN
Start: 2021-11-17 — End: 2021-11-19

## 2021-11-17 MED ORDER — IOHEXOL 350 MG IODINE/ML INTRAVENOUS SOLUTION
50.0000 mL | INTRAVENOUS | Status: AC
Start: 2021-11-17 — End: 2021-11-17
  Administered 2021-11-17: 75 mL via INTRAVENOUS

## 2021-11-17 MED ORDER — METOPROLOL TARTRATE 25 MG TABLET
25.0000 mg | ORAL_TABLET | Freq: Two times a day (BID) | ORAL | Status: DC
Start: 2021-11-17 — End: 2021-11-18
  Administered 2021-11-17: 25 mg via ORAL
  Administered 2021-11-18: 0 mg via ORAL
  Filled 2021-11-17: qty 1

## 2021-11-17 MED ORDER — LISINOPRIL 5 MG TABLET
12.5000 mg | ORAL_TABLET | Freq: Every day | ORAL | Status: DC
Start: 2021-11-18 — End: 2021-11-18
  Administered 2021-11-18: 12.5 mg via ORAL
  Filled 2021-11-17: qty 3

## 2021-11-17 MED ORDER — INSULIN REGULAR HUMAN 100 UNIT/ML INJECTION SSIP
0.0000 [IU] | INJECTION | Freq: Four times a day (QID) | SUBCUTANEOUS | Status: DC | PRN
Start: 2021-11-17 — End: 2021-11-19

## 2021-11-17 MED ORDER — ALUMINUM-MAG HYDROXIDE-SIMETHICONE 200 MG-200 MG-20 MG/5 ML ORAL SUSP
30.0000 mL | ORAL | Status: DC | PRN
Start: 2021-11-17 — End: 2021-11-19

## 2021-11-17 MED ORDER — IPRATROPIUM 0.5 MG-ALBUTEROL 3 MG (2.5 MG BASE)/3 ML NEBULIZATION SOLN
3.0000 mL | INHALATION_SOLUTION | RESPIRATORY_TRACT | Status: AC
Start: 2021-11-17 — End: 2021-11-17
  Administered 2021-11-17: 3 mL via RESPIRATORY_TRACT

## 2021-11-17 MED ORDER — HEPARIN (PORCINE) 25,000 UNIT/250 ML IN 0.45 % SODIUM CHLORIDE IV SOLN
INTRAVENOUS | Status: AC
Start: 2021-11-17 — End: 2021-11-17
  Filled 2021-11-17: qty 250

## 2021-11-17 MED ORDER — ACETAMINOPHEN 325 MG TABLET
650.0000 mg | ORAL_TABLET | ORAL | Status: DC | PRN
Start: 2021-11-17 — End: 2021-11-19
  Administered 2021-11-17 – 2021-11-18 (×3): 650 mg via ORAL
  Filled 2021-11-17 (×3): qty 2

## 2021-11-17 MED ORDER — HEPARIN (PORCINE) 5,000 UNIT/ML INJECTION SOLUTION
INTRAMUSCULAR | Status: AC
Start: 2021-11-17 — End: 2021-11-17
  Filled 2021-11-17: qty 2

## 2021-11-17 MED ORDER — BUSPIRONE 5 MG TABLET
20.0000 mg | ORAL_TABLET | Freq: Two times a day (BID) | ORAL | Status: DC
Start: 2021-11-18 — End: 2021-11-18
  Administered 2021-11-18: 20 mg via ORAL
  Filled 2021-11-17: qty 4

## 2021-11-17 MED ORDER — TRAZODONE 100 MG TABLET
200.0000 mg | ORAL_TABLET | Freq: Every evening | ORAL | Status: DC
Start: 2021-11-17 — End: 2021-11-18
  Administered 2021-11-17: 200 mg via ORAL

## 2021-11-17 MED ORDER — DULOXETINE 30 MG CAPSULE,DELAYED RELEASE
60.0000 mg | DELAYED_RELEASE_CAPSULE | Freq: Every day | ORAL | Status: DC
Start: 2021-11-18 — End: 2021-11-18
  Administered 2021-11-18: 60 mg via ORAL
  Filled 2021-11-17: qty 2

## 2021-11-17 MED ORDER — TRAZODONE 100 MG TABLET
ORAL_TABLET | ORAL | Status: AC
Start: 2021-11-17 — End: 2021-11-17
  Filled 2021-11-17: qty 2

## 2021-11-17 MED ORDER — LEVOTHYROXINE 100 MCG TABLET
100.0000 ug | ORAL_TABLET | Freq: Every morning | ORAL | Status: DC
Start: 2021-11-18 — End: 2021-11-18
  Administered 2021-11-18: 100 ug via ORAL
  Filled 2021-11-17: qty 1

## 2021-11-17 MED ORDER — BUPROPION HCL 100 MG TABLET
100.0000 mg | ORAL_TABLET | Freq: Two times a day (BID) | ORAL | Status: DC
Start: 2021-11-17 — End: 2021-11-18
  Administered 2021-11-17: 0 mg via ORAL
  Administered 2021-11-18: 100 mg via ORAL
  Filled 2021-11-17 (×3): qty 1

## 2021-11-17 MED ORDER — DEXTROSE 50 % IN WATER (D50W) INTRAVENOUS SYRINGE
25.0000 g | INJECTION | INTRAVENOUS | Status: DC | PRN
Start: 2021-11-17 — End: 2021-11-19

## 2021-11-17 MED ORDER — HEPARIN (PORCINE) 5,000 UNITS/ML BOLUS
80.0000 [IU]/kg | Freq: Once | INTRAMUSCULAR | Status: AC
Start: 2021-11-17 — End: 2021-11-17
  Administered 2021-11-17: 5500 [IU] via INTRAVENOUS

## 2021-11-17 MED ORDER — METOPROLOL TARTRATE 25 MG TABLET
ORAL_TABLET | ORAL | Status: AC
Start: 2021-11-17 — End: 2021-11-17
  Filled 2021-11-17: qty 1

## 2021-11-17 MED ORDER — DEXTROSE 50 % IN WATER (D50W) INTRAVENOUS SYRINGE
12.5000 g | INJECTION | INTRAVENOUS | Status: DC | PRN
Start: 2021-11-17 — End: 2021-11-19

## 2021-11-17 NOTE — ED Nurses Note (Signed)
Report called to charge nurse on [facility] [place].

## 2021-11-17 NOTE — Nurses Notes (Signed)
Patient admitted to to 4E. Orientation has been given. All questions have been answered. Bed wheels locked. SR X 2. Call bell within reach. Hospital socks given.

## 2021-11-17 NOTE — ED Triage Notes (Signed)
Shortness of breath x 3 days worsening this AM, worse with exertion     CPAP at night

## 2021-11-17 NOTE — Nurses Notes (Addendum)
Patient has a medication list in their purse which was sent home. Patient stated that they would have it brought in tomorrow. Unable to confirm the medication list at this time on the nursing side. Asked patient if I could call somebody. Pt stated that they would have somebody bring in their list tomorrow.

## 2021-11-17 NOTE — ED Provider Notes (Signed)
[facility]  ED Primary Provider Note  History of Present Illness   Chief Complaint   Patient presents with   . Shortness of Breath     Arrival: The patient arrived by Car    Kathleen Munoz is a 76 y.o. female who had concerns including Shortness of Breath.  76 year old female with past medical history of PE, hypertension, diabetes mellitus presents to the emergency room for shortness of breath times 3-4 days but worse this a.m.Marland Kitchen  Patient states she has exertional shortness of breath.  She does have a cough in the a.m. with some yellow colored phlegm which is unchanged from normal.  She does use a CPAP at night.  Patient has no chest pain, edema to the lower extremities.  She has had no fever, chills.  Movement makes the shortness of breath worse.  Pain is currently 0/10.  She states when lying down her breathing is okay while she is on the CPAP.  She has no other complaints at this time.  She does note that she takes inhalers and sees pulmonology but is uncertain of her diagnosis.  She was a former smoker but quit over 30 years ago. Of note pt has a burn on her right ankle which she states she has been on antibiotics at home. Pt states she did have PE last year and is not on blood thinners.     Review of Systems   All other systems reviewed and are negative except as noted.    Historical Data   History Reviewed This Encounter: Medical History  Surgical History  Family History  Social History      Physical Exam   ED Triage Vitals [11/17/21 1045]   BP (Non-Invasive) 132/73   Heart Rate 81   Respiratory Rate (!) 24   Temperature 36.9 C (98.4 F)   SpO2 92 %   Weight 94.3 kg (208 lb)   Height 1.626 m ('5\' 4"' )       Constitutional:  76 y.o. female who appears in no distress. Normal color, no cyanosis. Oxygen saturation 89% on room air. Family at bedside.   HENT:   Head: Normocephalic and atraumatic.   Mouth/Throat: Oropharynx is clear and moist.   Eyes: EOMI, PERRL   Neck: Trachea  midline. Neck supple.  Cardiovascular: RRR, S1, S2. No murmurs, rubs or gallops. Intact distal pulses.  Pulmonary/Chest: BS clear and equal bilaterally. No respiratory distress. No wheezes, rales or chest tenderness. O2 saturation 89 % on RA.  Abdominal: Bowel sounds present and normal. Abdomen soft, no tenderness, no rebound and no guarding.  Back: No midline spinal tenderness, no paraspinal tenderness, no CVA tenderness.           Musculoskeletal: No edema, tenderness or deformity.  Skin: warm and dry. No rash, erythema, pallor or cyanosis. Burn noted to right lateral ankle with dark center, localized redness.   Psychiatric: normal mood and affect. Behavior is normal.   Neurological: Patient keenly alert and responsive, easily able to raise eyebrows, facial muscles/expressions symmetric, speaking in fluent sentences, moving all extremities equally and fully, normal gait  Patient Data     Labs Ordered/Reviewed   B-TYPE NATRIURETIC PEPTIDE - Abnormal; Notable for the following components:       Result Value    BNP 109 (*)     All other components within normal limits    Narrative:  Class 1: 101-250 pg/mL                              Class 2: 251-550 pg/mL                              Class 3: 551-900 pg/mL                              Class 4: >901 pg/mL     The [place] Heart Association has developed a four-stage functional classification system for CHF that is based on a subjective interpretation of the severity of a patient's clinical signs and symptoms.    Class 1 - Patients have no limitations on physical activity and have no symptoms with ordinary physical activity.    Class 2 - Patients have a slight limitation of physical activity and have symptoms with ordinary physical activity.    Class 3 - Patients have a marked limitation of physical activity and have symptoms with less than ordinary physical activity, but not at rest.    Class 4 - Patients are unable to perform any  physical activity without discomfort.   COMPREHENSIVE METABOLIC PANEL, NON-FASTING - Abnormal; Notable for the following components:    ANION GAP 7 (*)     All other components within normal limits    Narrative:     Estimated Glomerular Filtration Rate (eGFR) is calculated using the CKD-EPI (2021) equation, intended for patients 45 years of age and older. If gender is not documented or "unknown", there will be no eGFR calculation.   CBC WITH DIFF - Abnormal; Notable for the following components:    WBC 11.1 (*)     All other components within normal limits   MANUAL DIFFERENTIAL - Abnormal; Notable for the following components:    BAND % 1 (*)     All other components within normal limits   POC BLOOD GAS - CG4 (ARTERIAL - RESULTS) - Abnormal; Notable for the following components:    PH (PHP) 7.49 (*)     PO2 (PO2P) 57 (*)     HCO3 (HCO3P) 27 (*)     BASE EXCESS (BEP) 3.0 (*)     All other components within normal limits   LACTIC ACID LEVEL W/ REFLEX FOR LEVEL >2.0 - Normal   COVID-19, FLU A/B, RSV RAPID BY PCR - Normal    Narrative:     Results are for the simultaneous qualitative identification of SARS-CoV-2 (formerly 2019-nCoV), Influenza A, Influenza B, and RSV RNA. These etiologic agents are generally detectable in nasopharyngeal and nasal swabs during the ACUTE PHASE of infection. Hence, this test is intended to be performed on respiratory specimens collected from individuals with signs and symptoms of upper respiratory tract infection who meet Centers for Disease Control and Prevention (CDC) clinical and/or epidemiological criteria for Coronavirus Disease 2019 (COVID-19) testing. CDC COVID-19 criteria for testing on human specimens is available at [facility] webpage information for Healthcare Professionals: Coronavirus Disease 2019 (COVID-19) ([employer]).     False-negative results may occur if the virus has genomic mutations, insertions, deletions, or rearrangements or  if performed very early in the course of illness. Otherwise, negative results indicate virus specific RNA targets are not detected, however negative results do not preclude SARS-CoV-2 infection/COVID-19, Influenza, or Respiratory syncytial virus infection. Results should not be used  as the sole basis for patient management decisions. Negative results must be combined with clinical observations, patient history, and epidemiological information. If upper respiratory tract infection is still suspected based on exposure history together with other clinical findings, re-testing should be considered.    Disclaimer:   This assay has been authorized by FDA under an Emergency Use Authorization for use in laboratories certified under the Clinical Laboratory Improvement Amendments of 1988 (CLIA), 42 U.S.C. [phone], to perform high complexity tests. The impacts of vaccines, antiviral therapeutics, antibiotics, chemotherapeutic or immunosuppressant drugs have not been evaluated.     Test methodology:   Cepheid Xpert Xpress SARS-CoV-2/Flu/RSV Assay real-time polymerase chain reaction (RT-PCR) test on the GeneXpert Dx and Xpert Xpress systems.   PT/INR - Normal    Narrative:     INR OF 2.0-3.0  RECOMMENDED FOR: PROPHYLAXIS/TREATMENT OF VENEOUS THROMBOSIS, PULMONARY EMBOLISM, PREVENTION OF SYSTEMIC EMBOLISM FROM ATRIAL FIBRILATION, MYOCARDIAL INFARCTION.    INR OF 2.5-3.5  RECOMMENDED FOR MECHANICAL PROSTHETIC HEART VALVES, RECURRENT SYSTEMIC EMBOLISM, RECURRENT MYOCARDIAL INFARCTION.     PTT (PARTIAL THROMBOPLASTIN TIME) - Normal   TROPONIN-I - Normal   RESPIRATORY CULTURE AND GRAM STAIN, AEROBIC    Narrative:     The following orders were created for panel order [place].  Procedure                               Abnormality         Status                     ---------                               -----------         ------                     SPUTUM SCREEN[[id number]]                                                                  Please view results for these tests on the individual orders.   SPUTUM SCREEN   CBC/DIFF    Narrative:     The following orders were created for panel order CBC/DIFF.  Procedure                               Abnormality         Status                     ---------                               -----------         ------                     CBC WITH DIFF[[id number]]                Abnormal            Final  result               MANUAL DIFFERENTIAL[[id number]]          Abnormal            Final result                 Please view results for these tests on the individual orders.   PERFORM POC BLOOD GAS - CG4     CT ANGIO CHEST W IV CONTRAST   Final Result by Edi, Radresults In (05/07 1332)   Segmental pulmonary emboli in all 5 lung lobes. No evidence of right ventricular strain.      Clear lungs.      Findings were discussed by telephone with Caron Presume, APRN, on 11/17/2021 at 1330 hours.      One or more dose reduction techniques were used (e.g., Automated exposure control, adjustment of the mA and/or kV according to patient size, use of iterative reconstruction technique).         Radiologist location ID: [id number]         XR AP MOBILE CHEST   Final Result by Edi, Radresults In (05/07 1145)   NO ACUTE FINDINGS.         Radiologist location ID: [id number] and discussed patient with hospitalist on-call, Dr. Shon Hough, he states he will evaluate the patient for admit.  I also discussed this with the patient and daughter at bedside.  Heparin drip was initiated here in the emergency room.  Patient was placed on 2 L nasal cannula with saturations at 96%.    Amount and/or Complexity of Data Reviewed  Labs: ordered. Decision-making details documented in ED Course.  Radiology: ordered. Decision-making details documented in ED Course.     Details: + PE  ECG/medicine tests: ordered.     Details: EKG shows normal  sinus rhythm at 80 beats per minute.  No ST changes noted.  QTC 447 left axis deviation.       Risk  Prescription drug management.      Critical Care  Total time providing critical care: 35 minutes    ED Course as of 11/17/21 1357   Sun Nov 17, 2021   1322 Awaiting CTA for disposition         Medications Administered in the ED   heparin 5,000 units/mL initial IV BOLUS (has no administration in time range)   heparin 25,000 units in 0.45% NS 250 mL infusion (has no administration in time range)   ipratropium-albuterol 0.5 mg-3 mg(2.5 mg base)/3 mL Solution for Nebulization (3 mL Nebulization Given 11/17/21 1139)   iohexol (OMNIPAQUE 350) infusion (75 mL Intravenous Given 11/17/21 1320)     Clinical Impression   Bilateral pulmonary embolism (CMS HCC) (Primary)       Disposition: Admitted  CRITICAL CARE ATTESTATION: Total critical care time spent in direct care of this patient presenting with dyspnea, hypoxia who is at high risk of permanent disability and/or death based on history/exam/and complaint or signs/symptoms/history elicited during initial evaluation or during course of ED care, including the initial evaluation and stabilization, care delivered throughout ED stay, review of data, re-examination, discussion with admitting and consulting services to arrange definitive care, discussion with patient and family members as appropriate regarding such care, documentation of such, and exclusive of any procedures performed, was 35  minutes.

## 2021-11-17 NOTE — ED Nurses Note (Signed)
Patient taken to [facility] [place] at this time by S. Hill, Charity fundraiser. Cardiac monitor in place, belongings gathered, Heparin drip paused at this time per protocol.

## 2021-11-17 NOTE — Care Plan (Signed)
Problem: Adult Inpatient Plan of Care  Goal: Plan of Care Review  Outcome: Ongoing (see interventions/notes)  Goal: Patient-Specific Goal (Individualized)  Outcome: Ongoing (see interventions/notes)  Flowsheets (Taken 11/17/2021 2200)  Individualized Care Needs: To Get Well  Anxieties, Fears or Concerns: Being In Hospital nvironment  Patient-Specific Goals (Include Timeframe): To Get Home As Soon As Possible  Goal: Absence of Hospital-Acquired Illness or Injury  Outcome: Ongoing (see interventions/notes)  Goal: Optimal Comfort and Wellbeing  Outcome: Ongoing (see interventions/notes)  Goal: Rounds/Family Conference  Outcome: Ongoing (see interventions/notes)

## 2021-11-17 NOTE — H&P (Signed)
[facility]  [facility]  Hospitalist  History and Physical    Kiyani, Jernigan  Date of Admission:  11/17/2021  Date of Birth:  02-17-1946  PCP: Madolyn Frieze, MD      REASON FOR ADMISSION: SOB    HPI:   Pt arrives in the Er today with complaints of SOB x 3 days. She does have history of PE last year and was on Eliquis x 9 months. She states she had been on a trip last year and it was thought that she developed a DVT. She denies CP, NVD. CTA shows multiple PE bilaterally without evidence of heart strain. She was mildly hypoxic on arrival and is currently on 2 liters NC with sat of 95% and does not usually use home O2. She does have OSA and uses CPAP at night. BP and HR are normal.       ROS: Full review of systems performed negative with exception of pertinent negatives and positives mentioned in HPI.      Past Medical History:   Diagnosis Date   . Anxiety    . Arthritis    . Depression    . Diabetes mellitus, type 2 (CMS HCC)    . Esophageal reflux    . H/O blood clots     LUNGS   . Hypertension    . Personal history of fall    . Shortness of breath    . Sleep apnea     CPAP   . Thyroid disease              Past Surgical History:   Procedure Laterality Date   . HAND SURGERY Left    . HX BUNIONECTOMY     . HX TUBAL LIGATION               Medications Prior to Admission     Prescriptions    aspirin 81 mg Oral Tablet, Chewable    Chew 1 Tablet (81 mg total) Once a day    buPROPion (WELLBUTRIN) 100 mg Oral Tablet    Take 1 Tablet (100 mg total) by mouth Twice daily    busPIRone (BUSPAR) 10 mg Oral Tablet    Take 2 Tablets (20 mg total) by mouth Twice daily    DULoxetine (CYMBALTA DR) 60 mg Oral Capsule, Delayed Release(E.C.)    Take 1 Capsule (60 mg total) by mouth Once a day    fexofenadine (ALLEGRA) 180 mg Oral Tablet    Take 1 Tablet (180 mg total) by mouth Once a day    gabapentin (NEURONTIN) 300 mg Oral Capsule    Take by mouth    ipratropium bromide (ATROVENT) 21 mcg (0.03 %) Nasal nasal spray     Administer 2 Sprays into affected nostril(s) Twice per day as needed    levothyroxine (SYNTHROID) 100 mcg Oral Tablet    Take 1 Tablet (100 mcg total) by mouth Every morning    lisinopriL (PRINIVIL) 10 mg Oral Tablet    Take 12.5 mg by mouth Once a day    metoprolol tartrate (LOPRESSOR) 25 mg Oral Tablet    Take by mouth Twice daily    omeprazole (PRILOSEC) 40 mg Oral Capsule, Delayed Release(E.C.)    Take 1 Capsule (40 mg total) by mouth Once a day    SITagliptin phosphate (JANUVIA) 100 mg Oral Tablet    Take by mouth Once a day    traZODone (DESYREL) 150 mg Oral Tablet    Take  200 mg by mouth Every night            Allergies   Allergen Reactions   . Influenza Virus Vaccines        Social History     Tobacco Use   . Smoking status: Former     Types: Cigarettes   . Smokeless tobacco: Never   Substance Use Topics   . Alcohol use: Never   . Drug use: Never       Family History:      PHYSICAL EXAM:    Physical Exam  HENT:      Mouth/Throat:      Mouth: Mucous membranes are moist.   Cardiovascular:      Rate and Rhythm: Normal rate and regular rhythm.      Pulses: Normal pulses.      Heart sounds: Normal heart sounds.   Pulmonary:      Effort: Pulmonary effort is normal. No respiratory distress.      Breath sounds: Normal breath sounds. No wheezing or rhonchi.   Abdominal:      General: Abdomen is flat. Bowel sounds are normal.      Palpations: Abdomen is soft.   Skin:     Findings: Wound present.      Comments: Rt ankle   Neurological:      Mental Status: She is alert.           VITALS:  Temperature: 36.9 C (98.4 F)  Heart Rate: 78  BP (Non-Invasive): (!) 141/72  Respiratory Rate: (!) 22  SpO2: 94 %        Labs:    I have reviewed all lab results.    Imaging Studies:    CT ANGIO CHEST W IV CONTRAST   Final Result   Segmental pulmonary emboli in all 5 lung lobes. No evidence of right ventricular strain.      Clear lungs.      Findings were discussed by telephone with Juanda CrumbleJennifer Lambert, APRN, on 11/17/2021 at 1330  hours.      One or more dose reduction techniques were used (e.g., Automated exposure control, adjustment of the mA and/or kV according to patient size, use of iterative reconstruction technique).         Radiologist location ID: [id number]         XR AP MOBILE CHEST   Final Result   NO ACUTE FINDINGS.         Radiologist location ID: [id number]             Assessment/Plan:       Active Hospital Problems   (*Primary Problem)    Diagnosis   . *Pulmonary embolism (CMS HCC)   . Cellulitis and abscess of right lower extremity   . Sleep apnea     Chronic     CPAP     . Diabetes mellitus, type 2 (CMS HCC)     Chronic       Admit to the hospitalist for PE, cellulitis, OSA and DM.    PE  -Heparin drip for 24 hours and transition to Eliquis. Echo ordered. Will need to follow up with hematology OP for hypercoagulability studies. Continue O2 to keep sat above 90%.    Cellulitis  -Consult wound care and possible surgery for heating pad burn 3 weeks ago.    OSA  -BiPAP nightly    DM  -Sliding scale coverage and DM diet    Plan discussed with pt and questions answered.  The Hospitalist personally evaluated and examined the patient in conjunction with the MLP and agree with the assessments, treatment plan and disposition of the patient as recorded by the [facility].    Valentina Shaggy, FNP-C

## 2021-11-18 ENCOUNTER — Inpatient Hospital Stay (HOSPITAL_COMMUNITY): Payer: Medicare Other

## 2021-11-18 DIAGNOSIS — Z86711 Personal history of pulmonary embolism: Secondary | ICD-10-CM

## 2021-11-18 DIAGNOSIS — L03115 Cellulitis of right lower limb: Secondary | ICD-10-CM

## 2021-11-18 DIAGNOSIS — T25011A Burn of unspecified degree of right ankle, initial encounter: Secondary | ICD-10-CM

## 2021-11-18 LAB — POC BLOOD GLUCOSE (RESULTS)
GLUCOSE, POC: 97 mg/dl (ref 50–500)
GLUCOSE, POC: 134 mg/dl (ref 50–500)

## 2021-11-18 LAB — CBC WITH DIFF
BASOPHIL #: 0.1 10*3/uL (ref 0.00–0.30)
BASOPHIL %: 1 % (ref 0–3)
EOSINOPHIL #: 0.3 10*3/uL (ref 0.00–0.80)
EOSINOPHIL %: 3 % (ref 0–7)
HCT: 36.5 % — ABNORMAL LOW (ref 37.0–47.0)
HGB: 12.6 g/dL (ref 12.5–16.0)
LYMPHOCYTE #: 3 10*3/uL (ref 1.10–5.00)
LYMPHOCYTE %: 31 % (ref 25–45)
MCH: 30.1 pg (ref 27.0–32.0)
MCHC: 34.6 g/dL (ref 32.0–36.0)
MCV: 87 fL (ref 78.0–99.0)
MONOCYTE #: 0.9 10*3/uL (ref 0.00–1.30)
MONOCYTE %: 9 % (ref 0–12)
MPV: 8.2 fL (ref 7.4–10.4)
NEUTROPHIL #: 5.3 10*3/uL (ref 1.80–8.40)
NEUTROPHIL %: 55 % (ref 40–76)
PLATELETS: 274 10*3/uL (ref 140–440)
RBC: 4.2 10*6/uL (ref 4.20–5.40)
RDW: 13.8 % (ref 11.6–14.8)
WBC: 9.6 10*3/uL (ref 4.0–10.5)
WBCS UNCORRECTED: 9.6 10*3/uL

## 2021-11-18 LAB — BASIC METABOLIC PANEL
ANION GAP: 7 mmol/L — ABNORMAL LOW (ref 10–20)
BUN/CREA RATIO: 17 (ref 6–22)
BUN: 17 mg/dL (ref 7–25)
CALCIUM: 9.1 mg/dL (ref 8.6–10.3)
CHLORIDE: 104 mmol/L (ref 98–107)
CO2 TOTAL: 28 mmol/L (ref 21–31)
CREATININE: 1.02 mg/dL (ref 0.60–1.30)
ESTIMATED GFR: 57 mL/min/{1.73_m2} — ABNORMAL LOW (ref >59)
GLUCOSE: 108 mg/dL (ref 74–109)
OSMOLALITY, CALCULATED: 280 mOsm/kg (ref 270–290)
POTASSIUM: 4.1 mmol/L (ref 3.5–5.1)
SODIUM: 139 mmol/L (ref 136–145)

## 2021-11-18 MED ORDER — ASPIRIN 81 MG CHEWABLE TABLET
81.0000 mg | CHEWABLE_TABLET | Freq: Every day | ORAL | Status: DC
Start: 2021-11-18 — End: 2021-11-19
  Administered 2021-11-18 – 2021-11-19 (×2): 81 mg via ORAL
  Filled 2021-11-18 (×2): qty 1

## 2021-11-18 MED ORDER — SODIUM CHLORIDE 0.9 % INJECTION SOLUTION
2.0000 mL | Freq: Once | INTRAVENOUS | Status: DC | PRN
Start: 2021-11-18 — End: 2021-11-19
  Administered 2021-11-18: 6 mL via INTRAVENOUS

## 2021-11-18 MED ORDER — LEVOTHYROXINE 88 MCG TABLET
88.0000 ug | ORAL_TABLET | Freq: Every morning | ORAL | Status: DC
Start: 2021-11-18 — End: 2021-11-19
  Administered 2021-11-18: 0 ug via ORAL
  Administered 2021-11-19: 88 ug via ORAL

## 2021-11-18 MED ORDER — GABAPENTIN 400 MG CAPSULE
400.0000 mg | ORAL_CAPSULE | Freq: Three times a day (TID) | ORAL | Status: DC
Start: 2021-11-18 — End: 2021-11-19
  Administered 2021-11-18 – 2021-11-19 (×4): 400 mg via ORAL
  Filled 2021-11-18 (×4): qty 1

## 2021-11-18 MED ORDER — METOPROLOL TARTRATE 25 MG TABLET
25.0000 mg | ORAL_TABLET | Freq: Every day | ORAL | Status: DC
Start: 2021-11-19 — End: 2021-11-18

## 2021-11-18 MED ORDER — METOPROLOL SUCCINATE ER 25 MG TABLET,EXTENDED RELEASE 24 HR
25.0000 mg | ORAL_TABLET | Freq: Every day | ORAL | Status: DC
Start: 2021-11-18 — End: 2021-11-19
  Administered 2021-11-18 – 2021-11-19 (×2): 25 mg via ORAL
  Filled 2021-11-18 (×2): qty 1

## 2021-11-18 MED ORDER — LORATADINE 10 MG TABLET
10.0000 mg | ORAL_TABLET | Freq: Every day | ORAL | Status: DC
Start: 2021-11-19 — End: 2021-11-19
  Administered 2021-11-19: 10 mg via ORAL
  Filled 2021-11-18: qty 1

## 2021-11-18 MED ORDER — LEVOTHYROXINE 88 MCG TABLET
88.0000 ug | ORAL_TABLET | Freq: Every morning | ORAL | Status: DC
Start: 2021-11-19 — End: 2021-11-19
  Administered 2021-11-19: 0 ug via ORAL
  Filled 2021-11-18: qty 1

## 2021-11-18 MED ORDER — HYDROCHLOROTHIAZIDE 25 MG TABLET
12.5000 mg | ORAL_TABLET | Freq: Every day | ORAL | Status: DC
Start: 2021-11-18 — End: 2021-11-19
  Administered 2021-11-18 – 2021-11-19 (×2): 12.5 mg via ORAL
  Filled 2021-11-18 (×2): qty 1

## 2021-11-18 MED ORDER — TRAZODONE 50 MG TABLET
150.0000 mg | ORAL_TABLET | Freq: Every evening | ORAL | Status: DC
Start: 2021-11-18 — End: 2021-11-19
  Administered 2021-11-18: 150 mg via ORAL
  Filled 2021-11-18: qty 1

## 2021-11-18 MED ORDER — LISINOPRIL 20 MG TABLET
20.0000 mg | ORAL_TABLET | Freq: Every day | ORAL | Status: DC
Start: 2021-11-19 — End: 2021-11-18

## 2021-11-18 MED ORDER — IBUPROFEN 400 MG TABLET
400.0000 mg | ORAL_TABLET | Freq: Four times a day (QID) | ORAL | Status: DC | PRN
Start: 2021-11-18 — End: 2021-11-18

## 2021-11-18 MED ORDER — LISINOPRIL 20 MG-HYDROCHLOROTHIAZIDE 12.5 MG TABLET
1.0000 | ORAL_TABLET | Freq: Every day | ORAL | Status: DC
Start: 2021-11-18 — End: 2021-11-18

## 2021-11-18 MED ORDER — APIXABAN 5 MG TABLET
10.0000 mg | ORAL_TABLET | Freq: Two times a day (BID) | ORAL | Status: DC
Start: 2021-11-18 — End: 2021-11-19
  Administered 2021-11-18 – 2021-11-19 (×2): 10 mg via ORAL
  Filled 2021-11-18 (×2): qty 2

## 2021-11-18 MED ORDER — CLONAZEPAM 1 MG TABLET
1.0000 mg | ORAL_TABLET | Freq: Three times a day (TID) | ORAL | Status: DC
Start: 2021-11-18 — End: 2021-11-19
  Administered 2021-11-18 – 2021-11-19 (×4): 1 mg via ORAL
  Filled 2021-11-18 (×4): qty 1

## 2021-11-18 MED ORDER — LISINOPRIL 20 MG TABLET
20.0000 mg | ORAL_TABLET | Freq: Every day | ORAL | Status: DC
Start: 2021-11-18 — End: 2021-11-19
  Administered 2021-11-18: 0 mg via ORAL
  Administered 2021-11-19: 20 mg via ORAL
  Filled 2021-11-18: qty 1

## 2021-11-18 MED ORDER — PANTOPRAZOLE 40 MG TABLET,DELAYED RELEASE
40.0000 mg | DELAYED_RELEASE_TABLET | Freq: Every day | ORAL | Status: DC
Start: 2021-11-18 — End: 2021-11-19
  Administered 2021-11-18 – 2021-11-19 (×2): 40 mg via ORAL
  Filled 2021-11-18 (×2): qty 1

## 2021-11-18 NOTE — Consults (Signed)
[facility]  General Surgery  Consultation    Date of Service:  11/18/2021  Cornelia, Walraven, 76 y.o. female  Date of Admission:  11/17/2021  Date of Birth:  Mar 16, 1946  PCP: Madolyn Frieze, MD    Reason for Consultation:  Right lateral ankle burn    HPI:  KIYOMI PALLO is a 76 y.o. White female who is admitted for PE, on Heparin drip.  General surgery consulted for right lateral ankle heating pad burn.  She reports she was started on Keflex 5 days ago from her PCP with little improvement.     Past Medical History:   Diagnosis Date   . Anxiety    . Arthritis    . Depression    . Diabetes mellitus, type 2 (CMS HCC)    . Esophageal reflux    . H/O blood clots     LUNGS   . Hypertension    . Personal history of fall    . Shortness of breath    . Sleep apnea     CPAP   . Thyroid disease       Past Surgical History:   Procedure Laterality Date   . HAND SURGERY Left    . HX BUNIONECTOMY     . HX TUBAL LIGATION        Social History     Tobacco Use   . Smoking status: Former     Types: Cigarettes   . Smokeless tobacco: Never   Substance Use Topics   . Alcohol use: Never   . Drug use: Never       Family Medical History:    None        Medications Prior to Admission     Prescriptions    aspirin 81 mg Oral Tablet, Chewable    Chew 1 Tablet (81 mg total) Once a day    buPROPion (WELLBUTRIN) 100 mg Oral Tablet    Take 1 Tablet (100 mg total) by mouth Twice daily    Patient not taking:  Reported on 11/18/2021    busPIRone (BUSPAR) 10 mg Oral Tablet    Take 2 Tablets (20 mg total) by mouth Twice daily    Patient not taking:  Reported on 11/18/2021    clonazePAM (KLONOPIN) 1 mg Oral Tablet    Take 1 Tablet (1 mg total) by mouth Three times a day    DULoxetine (CYMBALTA DR) 60 mg Oral Capsule, Delayed Release(E.C.)    Take 1 Capsule (60 mg total) by mouth Once a day    Patient not taking:  Reported on 11/18/2021    fexofenadine (ALLEGRA) 180 mg Oral Tablet    Take 1 Tablet (180 mg total) by mouth Once a day     gabapentin (NEURONTIN) 300 mg Oral Capsule    Take 400 mg by mouth    ipratropium bromide (ATROVENT) 21 mcg (0.03 %) Nasal nasal spray    Administer 2 Sprays into affected nostril(s) Twice per day as needed    levothyroxine (SYNTHROID) 88 mcg Oral Tablet    Take 1 Tablet (88 mcg total) by mouth Every morning    lisinopriL-hydrochlorothiazide (ZESTORETIC) 20-12.5 mg Oral Tablet    Take 1 Tablet by mouth Once a day    metoprolol succinate (TOPROL-XL) 25 mg Oral Tablet Sustained Release 24 hr    Take 1 Tablet (25 mg total) by mouth Once a day    omeprazole (PRILOSEC) 40 mg Oral Capsule, Delayed Release(E.C.)  Take 1 Capsule (40 mg total) by mouth Once a day    SITagliptin phosphate (JANUVIA) 100 mg Oral Tablet    Take by mouth Once a day    traZODone (DESYREL) 150 mg Oral Tablet    Take 100 mg by mouth Every night         Allergies   Allergen Reactions   . Influenza Virus Vaccines           Patient Vitals for the past 24 hrs:   BP Temp Pulse Resp SpO2 Height Weight   11/18/21 1111 -- -- 64 -- -- -- --   11/18/21 0841 (!) 141/62 36.8 C (98.2 F) 67 18 -- -- --   11/18/21 0056 -- 36.9 C (98.4 F) 72 20 96 % -- --   11/17/21 2300 -- -- -- -- -- 1.626 m (5\' 4" ) 95.3 kg (210 lb)   11/17/21 2231 -- -- 73 -- -- -- --   11/17/21 2053 (!) 162/98 36.7 C (98.1 F) 84 20 92 % -- --   11/17/21 2000 138/68 -- 78 20 94 % -- --   11/17/21 1900 (!) 152/85 -- 78 -- 91 % -- --   11/17/21 1800 (!) 130/90 -- 82 18 94 % -- --   11/17/21 1700 (!) 142/65 -- 78 (!) 21 91 % -- --          Physical Exam  Constitutional:       General: She is awake.      Appearance: Normal appearance. She is well-developed.   Skin:     Comments: Right lateral ankle with open wound   Neurological:      Mental Status: She is alert.   Psychiatric:         Behavior: Behavior is cooperative.                  Laboratory Data:     Results for orders placed or performed during the hospital encounter of 11/17/21 (from the past 24 hour(s))   PTT (PARTIAL  THROMBOPLASTIN TIME)   Result Value Ref Range    APTT 140.2 (HH) 26.0 - 36.0 seconds   POC BLOOD GLUCOSE (RESULTS)   Result Value Ref Range    GLUCOSE, POC 117 50 - 500 mg/dl   PTT (PARTIAL THROMBOPLASTIN TIME)   Result Value Ref Range    APTT 43.2 (H) 26.0 - 36.0 seconds   BASIC METABOLIC PANEL, NON-FASTING   Result Value Ref Range    SODIUM 139 136 - 145 mmol/L    POTASSIUM 4.1 3.5 - 5.1 mmol/L    CHLORIDE 104 98 - 107 mmol/L    CO2 TOTAL 28 21 - 31 mmol/L    ANION GAP 7 (L) 10 - 20 mmol/L    CALCIUM 9.1 8.6 - 10.3 mg/dL    GLUCOSE 01/17/22 74 - 161 mg/dL    BUN 17 7 - 25 mg/dL    CREATININE 096 0.45 - 1.30 mg/dL    BUN/CREA RATIO 17 6 - 22    ESTIMATED GFR 57 (L) >59 mL/min/1.31m^2    OSMOLALITY, CALCULATED 280 270 - 290 mOsm/kg   CBC WITH DIFF   Result Value Ref Range    WBCS UNCORRECTED 9.6 x10^3/uL    WBC 9.6 4.0 - 10.5 x10^3/uL    RBC 4.20 4.20 - 5.40 x10^6/uL    HGB 12.6 12.5 - 16.0 g/dL    HCT 75m (L) 81.1 - 47.0 %    MCV 87.0 78.0 -  99.0 fL    MCH 30.1 27.0 - 32.0 pg    MCHC 34.6 32.0 - 36.0 g/dL    RDW 16.113.8 09.611.6 - 04.514.8 %    PLATELETS 274 140 - 440 x10^3/uL    MPV 8.2 7.4 - 10.4 fL    NEUTROPHIL % 55 40 - 76 %    LYMPHOCYTE % 31 25 - 45 %    MONOCYTE % 9 0 - 12 %    EOSINOPHIL % 3 0 - 7 %    BASOPHIL % 1 0 - 3 %    NEUTROPHIL # 5.30 1.80 - 8.40 x10^3/uL    LYMPHOCYTE # 3.00 1.10 - 5.00 x10^3/uL    MONOCYTE # 0.90 0.00 - 1.30 x10^3/uL    EOSINOPHIL # 0.30 0.00 - 0.80 x10^3/uL    BASOPHIL # 0.10 0.00 - 0.30 x10^3/uL   POC BLOOD GLUCOSE (RESULTS)   Result Value Ref Range    GLUCOSE, POC 97 50 - 500 mg/dl       Imaging Studies:    CT ANGIO CHEST W IV CONTRAST   Final Result by Edi, Radresults In (05/07 1332)   Segmental pulmonary emboli in all 5 lung lobes. No evidence of right ventricular strain.      Clear lungs.      Findings were discussed by telephone with Juanda CrumbleJennifer Lambert, APRN, on 11/17/2021 at 1330 hours.      One or more dose reduction techniques were used (e.g., Automated exposure control, adjustment of  the mA and/or kV according to patient size, use of iterative reconstruction technique).         Radiologist location ID: [id number]         XR AP MOBILE CHEST   Final Result by Edi, Radresults In (05/07 1145)   NO ACUTE FINDINGS.         Radiologist location ID: [id number]              Assessment/Plan:  Active Hospital Problems    Diagnosis   . Primary Problem: Pulmonary embolism (CMS HCC)   . Cellulitis and abscess of right lower extremity   . Sleep apnea   . Diabetes mellitus, type 2 (CMS HCC)     Does not require surgical intervention.  Local wound care only.  I would be happy to follow as an outpatient as well.    The advanced practice clinician's documentation was reviewed/amended in its entirety with the assessment and plan portion completely performed independently by me during this separate encounter.  Maura Crandallavid A Remmi Armenteros MD MBA CPE FACS       This note was partially created using voice recognition software and is inherently subject to errors including those of syntax and "sound alike " substitutions which may escape proof reading. In such instances, original meaning may be extrapolated by contextual derivation.    Gita Kudoevonda Wills, FNP

## 2021-11-18 NOTE — Nurses Notes (Signed)
Referral for wounds,  Pt admitted with PE on 11/17/21.  Braden 21.  Has hx of T2DM.  Ankle XR negative.  Pt stated that she burn the ankle on heating pad.     11/18/21 1100   Wound (Non-Surgical) Anterior;Right Foot   Initial Date of Wound Assessment/Initial Time of Wound Assessment: 11/17/21 1933   Present on Hospital Admission: Yes  Wound Approximate Age at First Assessment (Weeks): (c) 3 weeks  Primary Wound Type: Burn  Orientation: Anterior;Right  Location: Foot   Wound Thickness Partial Thickness   Periwound Pink   Wound Bed purple;pink  (blister)   Wound Length (cm) 3 cm   Wound Width (cm) 2.5 cm   Wound Surface Area (cm^2) 7.5 cm^2   Wound Drainage Amount None   DRESSING TYPE Border Foam   DRESSING STATUS Changed/ New     Recommendation:  Surgical consult.                                   Silvadene ointment daily until further orders from surgery.

## 2021-11-18 NOTE — Progress Notes (Signed)
[facility]    HOSPITALIST PROGRESS NOTE    Kathleen Munoz  Date of service: 11/18/2021  Date of Admission:  11/17/2021  Hospital Day:  LOS: 1 day     Subjective:   HPI:   Pt bing seen with complaints of SOB x 3 days. She does have history of PE last year and was on Eliquis x 9 months. She states she had been on a trip last year and it was thought that she developed a DVT. She denies CP, NVD. CTA shows multiple PE bilaterally without evidence of heart strain. She was mildly hypoxic on arrival and is currently on 2 liters NC with sat of 95% and does not usually use home O2. She does have OSA and uses CPAP at night.     11/18/21-Pt seen and examined. She is on oxygen at 2L and satting 94%. She is not on home O2. She said she was down for a test and up to the BR and feeling more SOB for about 30 minutes. She does not appear to be in any distress. Usually when she first gets up her nose runs and she coughs up clear sputum which she is doing today. She does not know her CPAP settings and is not bringing hers from home. Called Dr. Clayton Bibles. Patel's office and she is on a pressure of 10. Wound care specialist looked at heating pad burn to right lateral ankle. Recommending surgical consultation. She said her PCP prescribed Keflex for the burn for 10 days, but she had only taken it 3-4 days. He asked for her home meds to be restarted. She had a bowel movement today.    Vital Signs:  Filed Vitals:    11/17/21 2231 11/18/21 0056 11/18/21 0841 11/18/21 1111   BP:   (!) 141/62    Pulse: 73 72 67 64   Resp:  20 18    Temp:  36.9 C (98.4 F) 36.8 C (98.2 F)    SpO2:  96%          Physical Exam:  General:  Patient in NAD, resting in bed, no visitors present  Head:  Normocephalic, atraumatic  Eyes:  PERRL, anicteric sclera  ENT:  Oral mucosa moist, no nasal discharge   Neck:  Soft, supple, trachea midline  Heart:  RRR, S1 and S2 normal  Lungs:  Unlabored respirations.  Lungs are clear to auscultation  bilaterally, with no wheezes, no rales, no conversational dyspnea  Abdomen:  Soft, active bowel sounds, non-tender to palpation, non-distended  Extremities:  Pulses equal bilaterally.  Capillary refill less than 3 seconds. 1+ edema right lower leg  Skin:  Warm and dry, not diaphoretic.  No ecchymosis noted. Burn to right lateral ankle. Skin closed, no drainage. Circular in shape.  Neuro:  A&O x 3.  No focal deficits.  Speech intact  Psych:  Cooperative, not agitated    Intake & Output:    Intake/Output Summary (Last 24 hours) at 11/18/2021 1158  Last data filed at 11/18/2021 0500  Gross per 24 hour   Intake 140 ml   Output --   Net 140 ml     I/O current shift:  No intake/output data recorded.  Emesis:    BM:    Date of Last Bowel Movement: 11/17/21  Heme:      acetaminophen (TYLENOL) tablet, 650 mg, Oral, Q4H PRN  albuterol (PROVENTIL) 2.5 mg / 3 mL (0.083%) neb solution, 2.5 mg, Nebulization, Q4H PRN  aluminum-magnesium  hydroxide-simethicone (MAG-AL PLUS) 200-200-20 mg per 5 mL oral liquid, 30 mL, Oral, Q4H PRN  dextrose 50% (0.5 g/mL) injection - syringe, 25 g, Intravenous, Q15 Min PRN   Or  dextrose 50% (0.5 g/mL) injection - syringe, 12.5 g, Intravenous, Q15 Min PRN   Or  glucagon (GLUCAGEN DIAGNOSTIC KIT) injection 1 mg, 1 mg, Subcutaneous, Q15 Min PRN   Or  glucagon (GLUCAGEN DIAGNOSTIC KIT) injection 1 mg, 1 mg, IntraMUSCULAR, Q15 Min PRN  heparin 25,000 units in 0.45% NS 250 mL infusion, 18 Units/kg/hr (Adjusted), Intravenous, Continuous  hydroCHLOROthiazide (HYDRODIURIL) tablet, 12.5 mg, Oral, Daily  [START ON 11/19/2021] levothyroxine (SYNTHROID) tablet, 88 mcg, Oral, QAM  [START ON 11/19/2021] lisinopril (PRINIVIL) tablet, 20 mg, Oral, Daily  [START ON 11/19/2021] metoprolol tartrate (LOPRESSOR) tablet, 25 mg, Oral, Daily  perflutren lipid microspheres (DEFINITY) 1.3 mL in NS 10 mL (tot vol) injection, 2 mL, Intravenous, Cardiology Once PRN  SSIP insulin R human (HUMULIN R) 100 units/mL injection, 0-12 Units,  Subcutaneous, 4x/day PRN  traZODone (DESYREL) tablet 150 mg, 150 mg, Oral, NIGHTLY          Labs:  Recent Results (from the past 48 hour(s))   CBC WITH DIFF    Collection Time: 11/18/21  3:14 AM   Result Value    WBC 9.6    HGB 12.6    HCT 36.5 (L)    PLATELETS 274      Results for orders placed or performed during the hospital encounter of 11/17/21 (from the past 48 hour(s))   BASIC METABOLIC PANEL, NON-FASTING    Collection Time: 11/18/21  3:14 AM   Result Value    SODIUM 139    POTASSIUM 4.1    CHLORIDE 104    CO2 TOTAL 28    GLUCOSE 108    BUN 17    CREATININE 1.02      Recent Results (from the past 48 hour(s))   COMPREHENSIVE METABOLIC PANEL, NON-FASTING    Collection Time: 11/17/21 11:56 AM   Result Value    ALKALINE PHOSPHATASE 57    ALT (SGPT) 22    AST (SGOT) 18      Results for orders placed or performed during the hospital encounter of 11/17/21 (from the past 48 hour(s))   TROPONIN-I    Collection Time: 11/17/21 11:56 AM   Result Value    TROPONIN I 9   B-TYPE NATRIURETIC PEPTIDE    Collection Time: 11/17/21 11:56 AM   Result Value    BNP 109 (H)      Recent Results (from the past 48 hour(s))   PTT (PARTIAL THROMBOPLASTIN TIME)    Collection Time: 11/17/21  9:14 PM   Result Value    APTT 43.2 (H)   PT/INR    Collection Time: 11/17/21 11:56 AM   Result Value    PROTHROMBIN TIME 12.4    INR 1.07      No results found for this or any previous visit (from the past 1344 hour(s)).   No results found for this or any previous visit (from the past 48 hour(s)).     Microbiology:  Hospital Encounter on 11/17/21 (from the past 96 hour(s))   SPUTUM-RESPIRATORY CULTURE AND GRAM STAIN, AEROBIC    Collection Time: 11/17/21 11:17 AM    Specimen: Sputum; Other    Narrative    The following orders were created for panel order [place], AEROBIC.  Procedure  Abnormality         Status                     ---------                               -----------          ------                     SPUTUM SCREEN[[id number]]                                                                 Please view results for these tests on the individual orders.       Imaging:   CT ANGIO CHEST W IV CONTRAST  Narrative: Kathleen Munoz    RADIOLOGIST: Ileene Hutchinson    CT ANGIO CHEST W IV CONTRAST performed on 11/17/2021 1:17 PM    CLINICAL HISTORY: sob, hypoxemia.  sob hx pe 1 yr ago    TECHNIQUE: CTA imaging of the chest with intravenous contrast.  3D reconstructions.  CONTRAST:  75 ml's of Omnipaque 350    COMPARISON: CT chest dated 06/17/2021.  # of known CTs in the past 12 months:  1   # of known Cardiac Nuclear Medicine Studies in the past 12 months:  0         FINDINGS:  Hardware:  None.    Lymph nodes:   No mediastinal, hilar, or axillary lymphadenopathy.    Heart:  Normal heart size. Mild coronary artery calcified atherosclerosis. Normal pericardium.        RV/LV Diameter Ratio: Less than 1    Thoracic Aorta:  No thoracic aortic aneurysm or dissection.    Pulmonary Vessels:  Segmental pulmonary emboli have developed in all 5 lung lobes.    Lungs and Airways:  The lungs are normally expanded and clear.    Pleura: No pleural effusion.  No pneumothorax.    Upper Abdomen: Visualized portions of the upper abdominal viscera are unremarkable.    Bones: Bone windows are unremarkable.  Impression: Segmental pulmonary emboli in all 5 lung lobes. No evidence of right ventricular strain.    Clear lungs.    Findings were discussed by telephone with Kathleen Presume, APRN, on 11/17/2021 at 1330 hours.    One or more dose reduction techniques were used (e.g., Automated exposure control, adjustment of the mA and/or kV according to patient size, use of iterative reconstruction technique).    Radiologist location ID: [id number]  ECG 12 LEAD  Normal sinus rhythm  Left axis deviation  Abnormal ECG  No previous ECGs available  Confirmed by Rana, Javed ([id number]) on 11/17/2021 1:03:38 PM  XR AP MOBILE CHEST  Narrative:  Tiasia S Nuss    RADIOLOGIST: Ileene Hutchinson    XR AP MOBILE CHEST performed on 11/17/2021 11:38 AM    CLINICAL HISTORY: sob, hypoxia.  SOB, HYPOXIA    TECHNIQUE: Frontal view of the chest.    COMPARISON:  Chest radiograph dated 10/11/2021    FINDINGS:    The heart size is normal.   The lungs are clear.   Impression: NO ACUTE FINDINGS.    Radiologist location  ID: [id number]      Assessment/ Plan:   Active Hospital Problems   (*Primary Problem)    Diagnosis    *Pulmonary embolism (CMS HCC)    Cellulitis and abscess of right lower extremity    Sleep apnea     Chronic     CPAP      Diabetes mellitus, type 2 (CMS HCC)     Chronic     PE:  Heparin drip, on 24 hrs at 3p, will be DC'd and transitioned to Eliquis 10 mg BID, treatmnt dose for one week. Her insurance will cover it. Echo ordered. Will need to follow up with hematology OP for hypercoagulability studies. Continue O2 to keep sat above 90%.    Cellulitis right lower extremity:  -Consult wound care and possible surgery for heating pad burn 3 weeks ago. Wound care nurse recommends surgical consultation. Dr. Venia Minks consulted.    OSA:  Called V. Patel's office and her CPAP pressure setting is 10. Will order.    DM  -Sliding scale coverage and DM diet  -Home meds reconciled 11/18/21, nurse had pharmacy fax list    Disposition Planning: Home    The Hospitalist personally evaluated and examined the patient in conjunction with the MLP and agree with the assessments, treatment plan and disposition of the patient as recorded by the [facility].     Nadara Mustard, FNP-BC    [place] HOSPITALIST    I have discussed patient with mid-level and in agreement with plan   Dr. Vida Roller

## 2021-11-19 LAB — BASIC METABOLIC PANEL
ANION GAP: 4 mmol/L — ABNORMAL LOW (ref 10–20)
BUN/CREA RATIO: 12 (ref 6–22)
BUN: 13 mg/dL (ref 7–25)
CALCIUM: 9.5 mg/dL (ref 8.6–10.3)
CHLORIDE: 100 mmol/L (ref 98–107)
CO2 TOTAL: 32 mmol/L — ABNORMAL HIGH (ref 21–31)
CREATININE: 1.09 mg/dL (ref 0.60–1.30)
ESTIMATED GFR: 53 mL/min/{1.73_m2} — ABNORMAL LOW (ref >59)
GLUCOSE: 172 mg/dL — ABNORMAL HIGH (ref 74–109)
OSMOLALITY, CALCULATED: 276 mOsm/kg (ref 270–290)
POTASSIUM: 4.5 mmol/L (ref 3.5–5.1)
SODIUM: 136 mmol/L (ref 136–145)

## 2021-11-19 LAB — CBC WITH DIFF
BASOPHIL #: 0.1 10*3/uL (ref 0.00–0.30)
BASOPHIL %: 1 % (ref 0–3)
EOSINOPHIL #: 0.3 10*3/uL (ref 0.00–0.80)
EOSINOPHIL %: 3 % (ref 0–7)
HCT: 40.4 % (ref 37.0–47.0)
HGB: 13.8 g/dL (ref 12.5–16.0)
LYMPHOCYTE #: 1.7 10*3/uL (ref 1.10–5.00)
LYMPHOCYTE %: 17 % — ABNORMAL LOW (ref 25–45)
MCH: 29.7 pg (ref 27.0–32.0)
MCHC: 34.1 g/dL (ref 32.0–36.0)
MCV: 86.9 fL (ref 78.0–99.0)
MONOCYTE #: 0.9 10*3/uL (ref 0.00–1.30)
MONOCYTE %: 9 % (ref 0–12)
MPV: 8.2 fL (ref 7.4–10.4)
NEUTROPHIL #: 6.8 10*3/uL (ref 1.80–8.40)
NEUTROPHIL %: 70 % (ref 40–76)
PLATELETS: 312 10*3/uL (ref 140–440)
RBC: 4.65 10*6/uL (ref 4.20–5.40)
RDW: 13.3 % (ref 11.6–14.8)
WBC: 9.7 10*3/uL (ref 4.0–10.5)
WBCS UNCORRECTED: 9.7 10*3/uL

## 2021-11-19 LAB — PTT (PARTIAL THROMBOPLASTIN TIME): APTT: 41.6 seconds — ABNORMAL HIGH (ref 26.0–36.0)

## 2021-11-19 LAB — MAGNESIUM: MAGNESIUM: 1.8 mg/dL — ABNORMAL LOW (ref 1.9–2.7)

## 2021-11-19 LAB — POC BLOOD GLUCOSE (RESULTS)
GLUCOSE, POC: 118 mg/dl (ref 50–500)
GLUCOSE, POC: 106 mg/dl (ref 50–500)

## 2021-11-19 MED ORDER — APIXABAN 5 MG TABLET
10.0000 mg | ORAL_TABLET | Freq: Two times a day (BID) | ORAL | 0 refills | Status: DC
Start: 2021-11-19 — End: 2022-04-11

## 2021-11-19 MED ORDER — METOPROLOL SUCCINATE ER 25 MG TABLET,EXTENDED RELEASE 24 HR
25.0000 mg | ORAL_TABLET | Freq: Every day | ORAL | 0 refills | Status: DC
Start: 2021-11-20 — End: 2022-04-11

## 2021-11-19 MED ORDER — SILVER SULFADIAZINE 1 % TOPICAL CREAM
TOPICAL_CREAM | Freq: Every day | CUTANEOUS | Status: DC
Start: 2021-11-19 — End: 2021-11-19
  Filled 2021-11-19: qty 50

## 2021-11-19 MED ORDER — MAGNESIUM OXIDE 400 MG (241.3 MG MAGNESIUM) TABLET
400.0000 mg | ORAL_TABLET | ORAL | Status: DC
Start: 2021-11-19 — End: 2021-11-19

## 2021-11-19 MED ORDER — LISINOPRIL 20 MG TABLET
20.0000 mg | ORAL_TABLET | Freq: Every day | ORAL | 0 refills | Status: DC
Start: 2021-11-20 — End: 2022-04-11

## 2021-11-19 NOTE — Care Management Notes (Signed)
[facility] Management Initial Evaluation    Patient Name: Kathleen Munoz  Date of Birth: 11-Oct-1945  Sex: female  Date/Time of Admission: 11/17/2021 10:46 AM  Room/Bed: 432/A  Payor: [employer] MEDICARE / Plan: [employer]arthur Rossetti MEDICARE ADV [place] / Product Type: PPO /   PCP: Drucilla Schmidt, MD    Pharmacy Info:   Preferred Pharmacy       [id number] - [place], [place]. AT [address] [place] [address]    Phone: [phone] Fax: [phone]    Hours: Not open 24 hours          Emergency Contact Info:   Extended Emergency Contact Information  Primary Emergency Contact: [place]  Mobile Phone: [phone]  Relation: Daughter    History:   Kathleen Munoz is a 76 y.o., female, admitted 11-17-21    Height/Weight: 162.6 cm (5\' 4" ) / 95.3 kg (210 lb)     LOS: 2 days   Admitting Diagnosis: Pulmonary embolism (CMS [facility]) [I26.99]    Assessment:      11/19/21 0847   Assessment Details   Assessment Type Admission   Insurance Information/Type   Insurance type Medicare   Living Environment   Lives With alone   Care Management Plan   Discharge Planning Status initial meeting   Projected Discharge Date 11/21/21   Discharge plan discussed with: Patient   CM will evaluate for rehabilitation potential yes  (Pt may benefit from [place] for PT)   Discharge Needs Assessment   Outpatient/Agency/Support Group Needs homecare agency   Equipment Currently Used at Home cane, straight   Discharge Facility/Level of Care Needs Home vs Home with Home Health   Referral Information   Admission Type inpatient     CM initial discharge planning assessment. Pt is an alert 76 yo female that is admitted with Pulmonary Embolism. Pt lives at home alone. Pt is a retired Marine scientist. When asked if there are any barriers in the home that will interfere with or impede care, Pt states no. CM asked Pt if she feels safe at home? Pt replied with,"yes". Pt drives and is able to access  medical care and medications when needed. Pt denies having any services in the home prior to her admission. Pt may benefit from Home Health/PT, due to Pt stating she is weak and feels balance is off. CM discussed with Charge Nurse about getting a PT evaluation in order to plan for discharge needs. Pt's discharge goals: wishes to return home when discharged.     Discharge Plan:  Home vs home with Home Health      The patient will continue to be evaluated for developing discharge needs.     Case Manager: Lianne Moris, SOCIAL WORKER 11/19/2021, 08:49  Phone: [phone]
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
## 2021-11-19 NOTE — PT Evaluation (Signed)
[facility]  [address]  [place], [id number]  [phone](Office) [phone]  (Fax) [phone]  Rehabilitation Services  Physical Therapy Inpatient Initial Evaluation    Patient Name: Kathleen Munoz  Date of Birth: 18-Sep-1945  Height: Height: 162.6 cm (5\' 4" )  Weight: Weight: 95.3 kg (210 lb)  Room/Bed: 432/A  Payor: [employer] MEDICARE / Plan: [employer] MEDICARE ADV [place] / Product Type: PPO /       PMH:  Past Medical History:   Diagnosis Date    Anxiety     Arthritis     Depression     Diabetes mellitus, type 2 (CMS HCC)     Esophageal reflux     H/O blood clots     LUNGS    Hypertension     Personal history of fall     Shortness of breath     Sleep apnea     CPAP    Thyroid disease            Assessment:      (P) Pt will benefit from skilled PT to increase mobility and safety    Discharge Needs:    Equipment Recommendation: (P) front wheeled walker      The patient presents with mobility limitations due to impaired balance, impaired strength, and impaired functional activity tolerance that significantly impair/prevent patient's ability to participate in mobility-related activities of daily living (MRADLs) including  ambulation and transfers in order to safely complete, toileting, bathing, safely entering/exiting the home. This functional mobility deficit can be sufficiently resolved with the use of a (P) front wheeled walker  in order to decrease the risk of falls, morbidity, and mortality in performance of these MRADLs.  Patient is able to safely use this assistive device.    Discharge Disposition: (P) home with home health    JUSTIFICATION OF DISCHARGE RECOMMENDATION   Based on current diagnosis, functional performance prior to admission, and current functional performance, this patient requires continued PT services in (P) home with home health in order to achieve significant functional improvements in these deficit areas: (P) aerobic capacity/endurance, gait, locomotion, and balance,  muscle performance.        Plan:   Current Intervention: (P) balance training, bed mobility training, gait training, home exercise program, neuromuscular re-education, patient/family education, stair training, strengthening, transfer training  To provide physical therapy services (P)  (1-2 times per day at least 1 day per week)  for duration of (P) until discharge.    The risks/benefits of therapy have been discussed with the patient/caregiver and he/she is in agreement with the established plan of care.       Subjective & Objective     Past Medical History:   Diagnosis Date    Anxiety     Arthritis     Depression     Diabetes mellitus, type 2 (CMS HCC)     Esophageal reflux     H/O blood clots     LUNGS    Hypertension     Personal history of fall     Shortness of breath     Sleep apnea     CPAP    Thyroid disease             Past Surgical History:   Procedure Laterality Date    HAND SURGERY Left     HX BUNIONECTOMY      HX TUBAL LIGATION  11/19/21 1031   Rehab Session   Document Type evaluation   Total PT Minutes: 29   Patient Effort good   Symptoms Noted Comment Pt reports some pain in L ankle wound, but otherwise no symptoms   General Information   Patient Profile Reviewed yes   Pertinent History of Current Functional Problem Pt admitted from home with SOB. Found to have multiple PE and anticoagulated. PT ordered to increase mobility.   Respiratory Status nasal cannula   Existing Precautions/Restrictions fall precautions   General Observations of Patient Pt agreeable to PT eval after clearance from nursing   Mutuality/Individual Preferences   Anxieties, Fears or Concerns None reported   Individualized Care Needs Increase mobility   Patient-Specific Goals (Include Timeframe) Go home   Plan of Care Reviewed With patient   Living Environment   Living Environment Comment Pt reports that she lives alone in a home with 9 STE with BL HR. Pt states that she was I with all ambulation and ADLs PTA. Does  reports that her balance has been somewhat decreased recently, but otherwise no issues with ambulation. States that she was I with driving and has had no falls.   Functional Level Prior   Prior Functional Level Comment See above   Pre Treatment Status   Pre Treatment Patient Status Patient supine in bed   Support Present Pre Treatment  None   Communication Pre Treatment  Charge Nurse   Cognitive Assessment/Interventions   Behavior/Mood Observations behavior appropriate to situation, WNL/WFL   Orientation Status oriented x 4   Vital Signs   Vitals Comment Pt with O2 sats at 82% on PT entry without supplemental O2. Nasal cannula placed back on pt and sats increased to 93%. O2 kept on for the remainder of her eval.   Pain Assessment   Pre/Posttreatment Pain Comment Reports some pain in R ankle wound, but otherwise no pain   General Extremity Assessment   Comment BLE ROM WFL; BLE strength grossly 3+/5   Bed Mobility Assessment/Treatment   Bed Mobility, Assistive Device bed rails   Supine-Sit Independence minimum assist (75% patient effort)   Sit to Supine, Independence not tested   Comment Left seated at EOB following PT   Transfer Assessment/Treatment   Sit-Stand Independence contact guard assist   Stand-Sit Independence contact guard assist   Sit-Stand-Sit, Assist Device walker, front wheeled   Gait Assessment/Treatment   Total Distance Ambulated 85   Independence  contact guard assist   Assistive Device  walker, front wheeled   Distance in Feet 85   Therapeutic Exercise/Activity   Comment Pt seen this AM and agreeable to PT eval. Pt supine in bed on arrival and transferred to sitting with CGA. Once seated pt able to scoot to EOB and asked to use the restroom prior to amb. She stood with CGA without AD and amb to restroom with CGA and holding on to furniture on her way. She sat to commode and completed toileting She then stood to RW in hopes that this would increase her balance. She amb 42' using RW with CGA and no  reports of SOB. Pt then returned to her room and stood as sink with SBA to complete ADL. She then returned to bed and sat with cuing for eccentric control. She was left seated at EOB per her request with all needs in reach   Post Treatment Status   Post Treatment Patient Status Patient sitting on edge of bed;Call light within reach   Support Present  Post Treatment  None   Communication Post Treatement Nurse   Plan of Care Review   Plan Of Care Reviewed With patient   Functional Impairment   Overall Functional Impairments/Problem List balance impaired;postural control impaired;endurance;strength decreased   Physical Therapy Clinical Impression   Assessment Pt will benefit from skilled PT to increase mobility and safety   Patient/Family Goals Statement Go home   Criteria for Skilled Therapeutic meets criteria;skilled treatment is necessary   Pathology/Pathophysiology Noted cardiovascular   Impairments Found (describe specific impairments) aerobic capacity/endurance;gait, locomotion, and balance;muscle performance   Functional Limitations in Following  self-care;home management;community/leisure   Rehab Potential good   Therapy Frequency   (1-2 times per day at least 1 day per week)   Predicted Duration of Therapy Intervention (days/wks) until discharge   Anticipated Equipment Needs at Discharge (PT) front wheeled walker   Anticipated Discharge Disposition home with home health   Evaluation Complexity Justification   Patient History: Co-morbidity/factors that impact Plan of Care 3 or more that impact Plan of Care   Examination Components Range of motion;Vital signs (BP, HR, RR, or O2 saturation);Strength;Balance;Bed mobility;Ambulation;Transfers   Presentation Stable: Uncomplicated, straight-forward, problem focused   Clinical Decision Making Low complexity   Evaluation Complexity Low complexity   Care Plan Goals   PT Rehab Goals Gait Training Goal;Transfer Training Goal   Gait Training  Goal, Distance to Achieve    Gait Training  Goal, Date Established 11/19/21   Gait Training  Goal, Time to Achieve by discharge   Gait Training  Goal, Independence Level modified independence   Gait Training  Goal, Assist Device least restricted assistive device   Gait Training  Goal, Distance to Achieve 150   Transfer Training Goal   Transfer Training Goal, Date Established 11/19/21   Transfer Training Goal, Time to Achieve by discharge   Transfer Training Goal, Activity Type bed-to-chair/chair-to-bed   Transfer Training Goal, Independence Level modified independence   Transfer Training Goal, Assist Device least restrictrictive assistive device   Planned Therapy Interventions, PT Eval   Planned Therapy Interventions (PT) balance training;bed mobility training;gait training;home exercise program;neuromuscular re-education;patient/family education;stair training;strengthening;transfer training               INTERVENTION MINUTES: EVALUATION 29 minutes    EVALUATION COMPLEXITY : CLINICAL DECISION MAKING OF LOW COMPLEXITY AS INDICATED BY PMH, PHYSICAL THERAPY ASSESSMENT OF MUSCULOSKELETAL AND NEUROLOGICAL SYSTEMS AND ACTIVITY LIMITATIONS. CLINICAL PRESENTATION IS STABLE AND UNCOMPLICATED    Therapist:     Annie Main, PT  11/19/2021, 13:21

## 2021-11-19 NOTE — Care Plan (Signed)
Problem: Adult Inpatient Plan of Care  Goal: Plan of Care Review  Outcome: Ongoing (see interventions/notes)  Goal: Patient-Specific Goal (Individualized)  Outcome: Ongoing (see interventions/notes)  Flowsheets (Taken 11/18/2021 2118)  Individualized Care Needs: start PO eliquis  Anxieties, Fears or Concerns: lack of sleep concerns  Patient-Specific Goals (Include Timeframe): d/c soon  Goal: Absence of Hospital-Acquired Illness or Injury  Outcome: Ongoing (see interventions/notes)  Goal: Optimal Comfort and Wellbeing  Outcome: Ongoing (see interventions/notes)  Goal: Rounds/Family Conference  Outcome: Ongoing (see interventions/notes)     Problem: Fall Injury Risk  Goal: Absence of Fall and Fall-Related Injury  Outcome: Ongoing (see interventions/notes)

## 2021-11-19 NOTE — Nurses Notes (Signed)
Patient O2 saturation 87%, on room air while at rest.

## 2021-11-19 NOTE — Care Management Notes (Signed)
Pt to be discharged home with Home Health/PT. Pt has selected [facility] to provide services. Pt will also require home oxygen 2L via NC continuous. [facility] to deliver oxygen prior to Pt's discharge.

## 2021-11-19 NOTE — Nurses Notes (Signed)
IV and telemetry unit removed.  Discharge instructions reviewed with patient, all questions answered.      Patient collected all personal belongings.  Currently waiting for family transportation to home.

## 2021-11-20 NOTE — Care Management Notes (Signed)
Referral Information  ++++++ Placed Provider #1 ++++++  Case Manager: Ronald Hicks  Provider Type: Home Health  Provider Name: [facility] [place]  Address:  [address], [address]  [place], [place] [address]  Contact: Tracy McClure    Phone: [phone] x  Fax:   Fax: [phone]

## 2021-11-20 NOTE — Care Management Notes (Signed)
Referral Information  ++++++ Placed Provider #1 ++++++  Case Manager: Ronald Hicks  Provider Type: DME  Provider Name: [facility] [place]  Address:  [address]  [place], [place] [address]  Contact:    Fax:   Fax:

## 2021-11-26 ENCOUNTER — Other Ambulatory Visit (INDEPENDENT_AMBULATORY_CARE_PROVIDER_SITE_OTHER): Payer: Self-pay | Admitting: NURSE PRACTITIONER

## 2021-12-19 ENCOUNTER — Other Ambulatory Visit (INDEPENDENT_AMBULATORY_CARE_PROVIDER_SITE_OTHER): Payer: Self-pay | Admitting: NURSE PRACTITIONER

## 2022-01-08 ENCOUNTER — Encounter (INDEPENDENT_AMBULATORY_CARE_PROVIDER_SITE_OTHER): Payer: Self-pay | Admitting: Surgery

## 2022-02-26 IMAGING — DX XRAY SHOULDER MINIMUM 2 VIEW LT
1 series · 2 of 2 positions shown · non-contrast
Comparison: None available.

﻿EXAM:  XRAY SHOULDER MINIMUM 2 VIEW LT
INDICATION: Persistent left shoulder pain for 6 months.  Diminished range of motion..

[Series 1: apobl · 0.14mm/px · 2 of 2 slices shown]
[im 1/2]
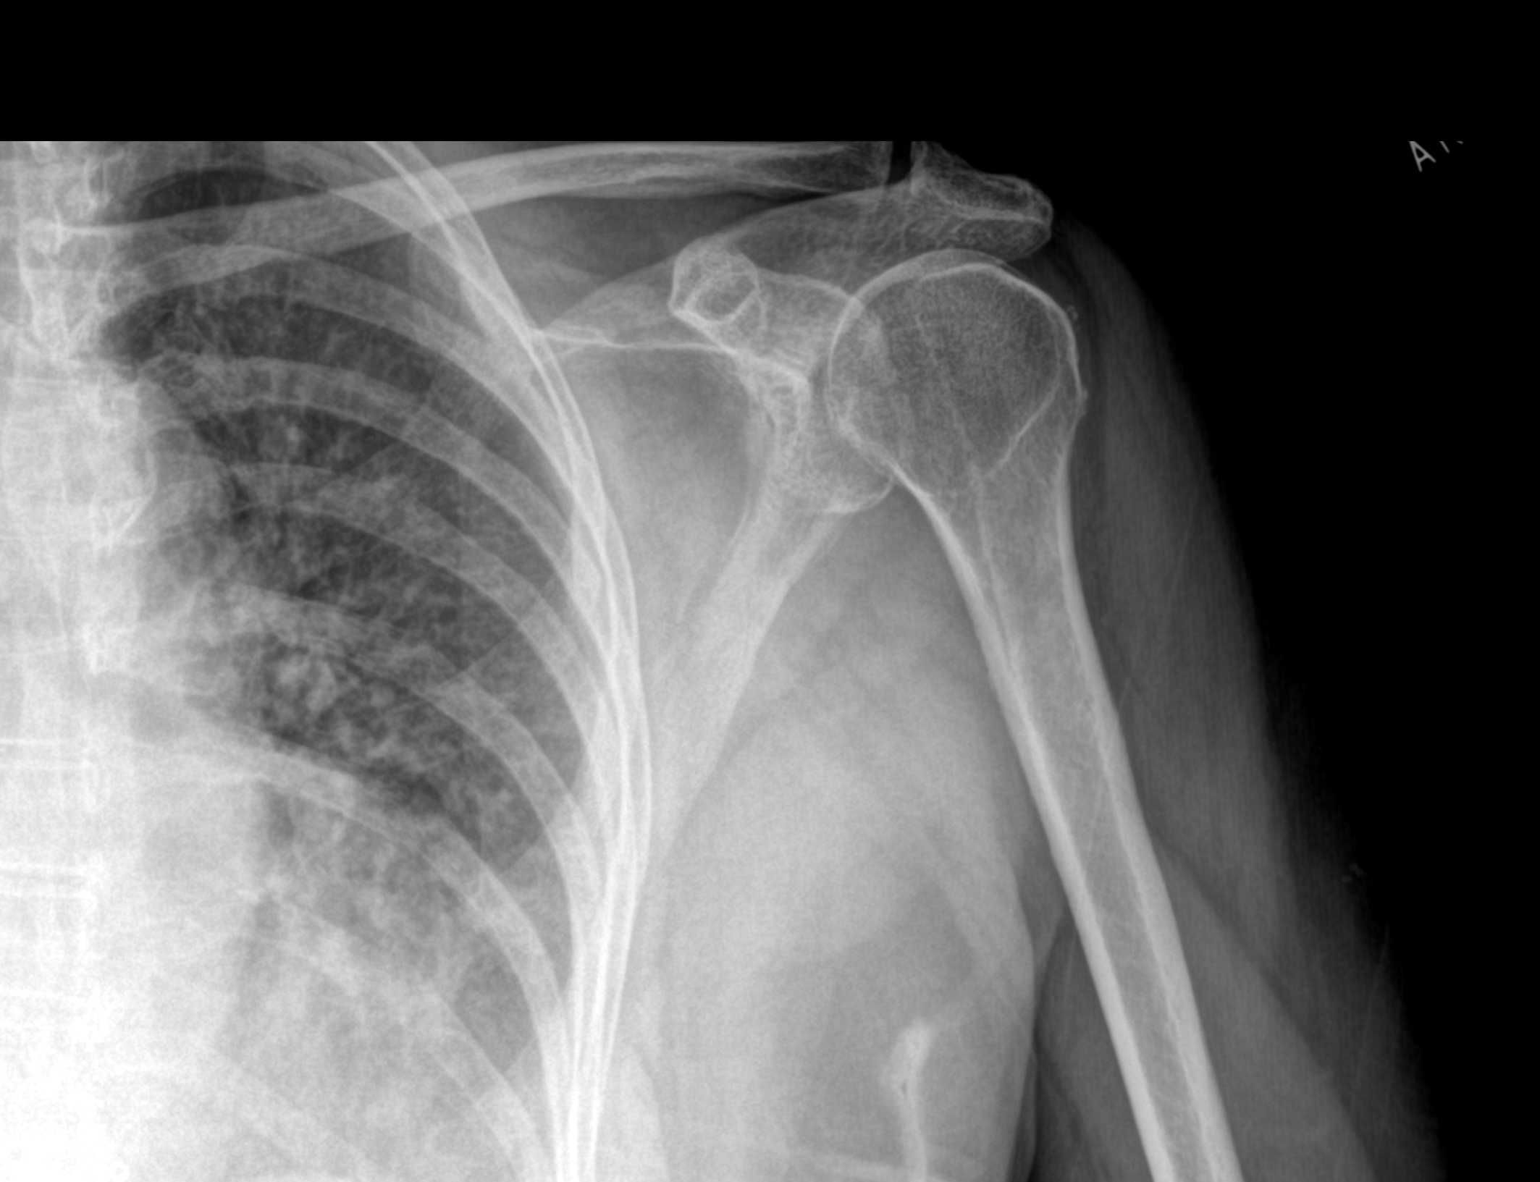
[im 2/2]
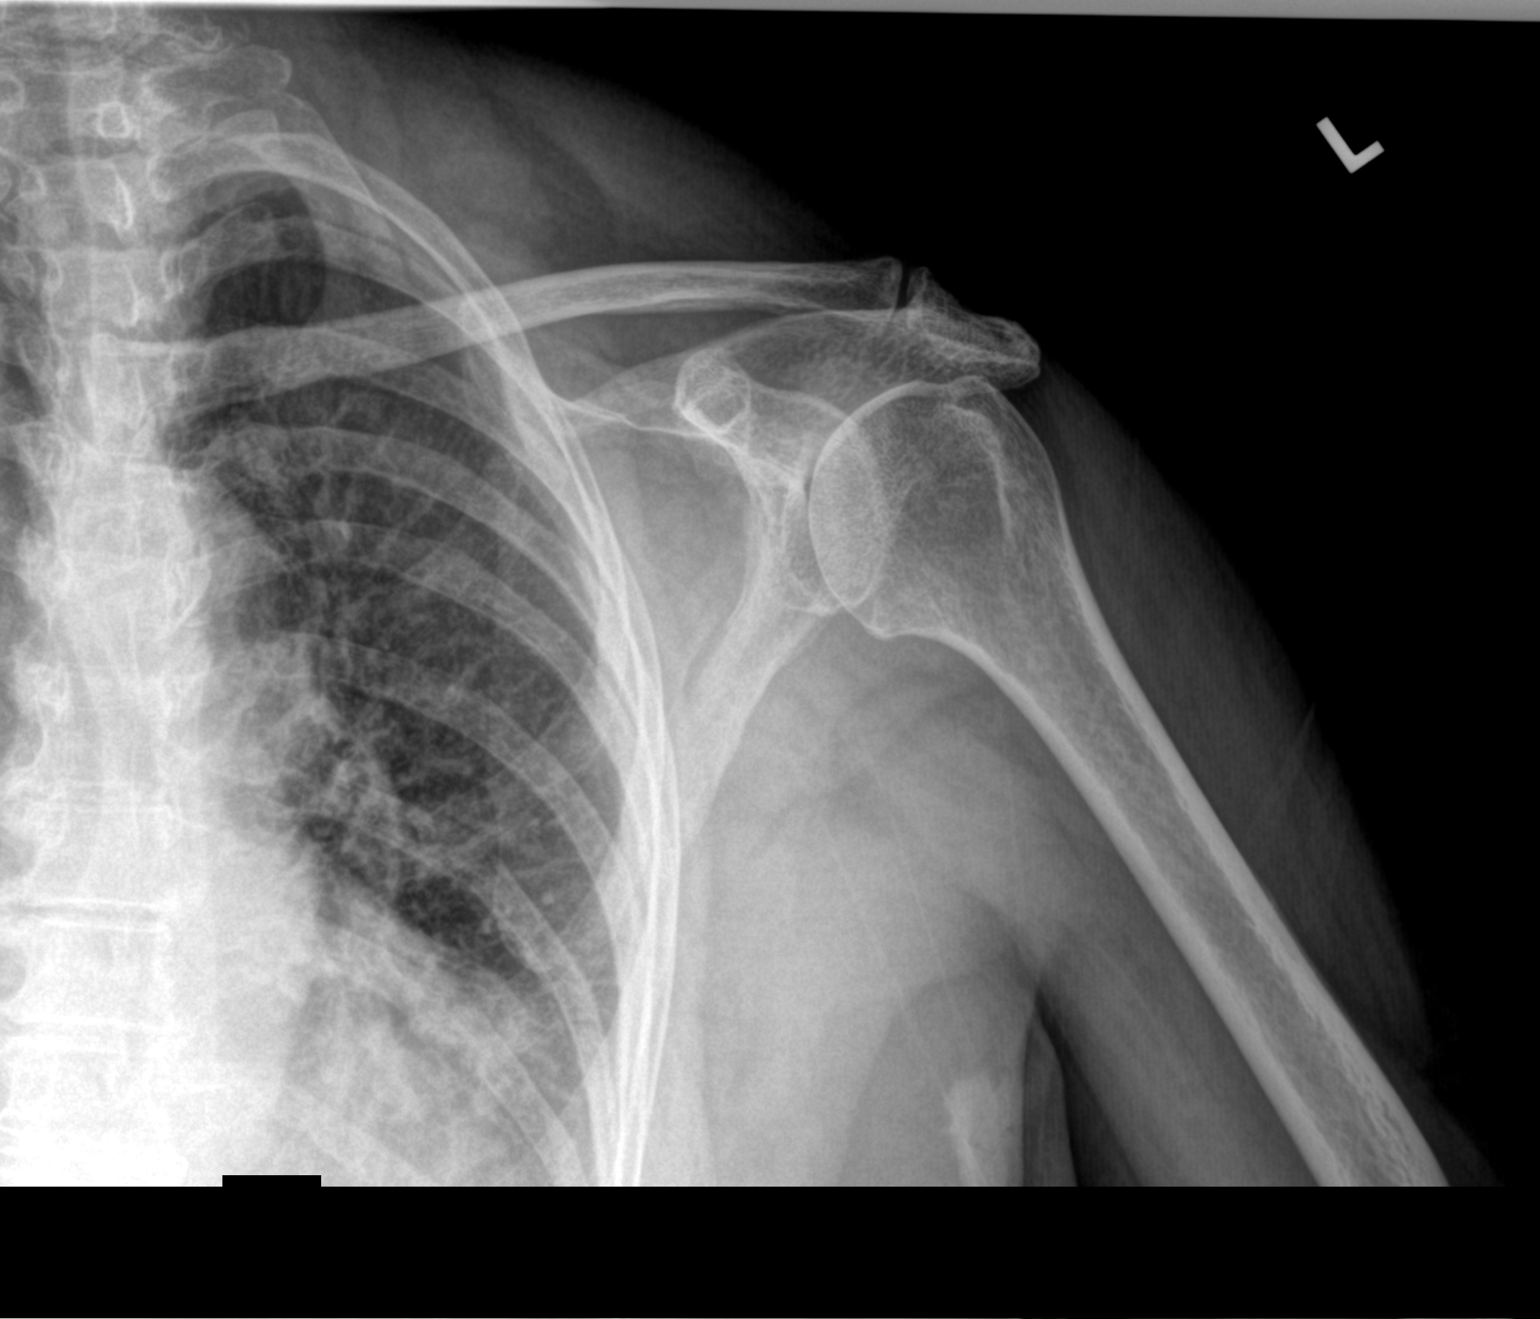

[2 of 2 positions shown; findings below may reference images not displayed]

FINDINGS: There is no acute fracture or subluxation. Mild calcific tendinitis of distal supraspinatus tendon.  Moderate degenerative arthritic changes of AC joint.
IMPRESSION: Moderate degenerative arthritis of AC joint. Mild calcific tendinitis of distal supraspinatus.  If clinical symptoms are localized and persistent additional imaging may be considered with MRI.

## 2022-03-14 ENCOUNTER — Other Ambulatory Visit (HOSPITAL_COMMUNITY): Payer: Self-pay | Admitting: Podiatrist

## 2022-03-14 DIAGNOSIS — L97511 Non-pressure chronic ulcer of other part of right foot limited to breakdown of skin: Secondary | ICD-10-CM

## 2022-03-25 ENCOUNTER — Inpatient Hospital Stay
Admission: RE | Admit: 2022-03-25 | Discharge: 2022-03-25 | Disposition: A | Discharge status: Home / Self Care | Payer: Medicare Other | Source: Ambulatory Visit | Attending: Podiatrist | Admitting: Podiatrist

## 2022-03-25 ENCOUNTER — Other Ambulatory Visit: Payer: Self-pay

## 2022-03-25 DIAGNOSIS — L97511 Non-pressure chronic ulcer of other part of right foot limited to breakdown of skin: Secondary | ICD-10-CM | POA: Insufficient documentation

## 2022-03-25 DIAGNOSIS — L03115 Cellulitis of right lower limb: Secondary | ICD-10-CM

## 2022-03-25 MED ORDER — GADOBUTROL 10 MMOL/10 ML (1 MMOL/ML) INTRAVENOUS SOLUTION
10.0000 mL | INTRAVENOUS | Status: AC
Start: 2022-03-25 — End: 2022-03-25
  Administered 2022-03-25: 9 mL via INTRAVENOUS

## 2022-04-07 ENCOUNTER — Emergency Department
Admission: EM | Admit: 2022-04-07 | Discharge: 2022-04-07 | Disposition: A | Discharge status: Other Acute Inpatient Hospital | Payer: Medicare Other | Attending: Emergency Medicine | Admitting: Emergency Medicine

## 2022-04-07 ENCOUNTER — Other Ambulatory Visit: Payer: Self-pay

## 2022-04-07 ENCOUNTER — Inpatient Hospital Stay
Admission: RE | Admit: 2022-04-07 | Discharge: 2022-04-11 | DRG: 885 | Disposition: A | Discharge status: Home / Self Care | Payer: Medicare Other | Source: Other Acute Inpatient Hospital | Attending: Psychiatry | Admitting: Psychiatry

## 2022-04-07 ENCOUNTER — Encounter (HOSPITAL_BASED_OUTPATIENT_CLINIC_OR_DEPARTMENT_OTHER): Payer: Self-pay

## 2022-04-07 ENCOUNTER — Encounter (HOSPITAL_COMMUNITY): Payer: Self-pay

## 2022-04-07 DIAGNOSIS — R45851 Suicidal ideations: Secondary | ICD-10-CM | POA: Diagnosis present

## 2022-04-07 DIAGNOSIS — Z86711 Personal history of pulmonary embolism: Secondary | ICD-10-CM | POA: Insufficient documentation

## 2022-04-07 DIAGNOSIS — Z9981 Dependence on supplemental oxygen: Secondary | ICD-10-CM

## 2022-04-07 DIAGNOSIS — G8929 Other chronic pain: Secondary | ICD-10-CM | POA: Diagnosis present

## 2022-04-07 DIAGNOSIS — F321 Major depressive disorder, single episode, moderate: Secondary | ICD-10-CM | POA: Insufficient documentation

## 2022-04-07 DIAGNOSIS — E079 Disorder of thyroid, unspecified: Secondary | ICD-10-CM | POA: Diagnosis present

## 2022-04-07 DIAGNOSIS — I10 Essential (primary) hypertension: Secondary | ICD-10-CM | POA: Diagnosis present

## 2022-04-07 DIAGNOSIS — E119 Type 2 diabetes mellitus without complications: Secondary | ICD-10-CM | POA: Diagnosis present

## 2022-04-07 DIAGNOSIS — F329 Major depressive disorder, single episode, unspecified: Secondary | ICD-10-CM

## 2022-04-07 DIAGNOSIS — Z634 Disappearance and death of family member: Secondary | ICD-10-CM

## 2022-04-07 DIAGNOSIS — I2699 Other pulmonary embolism without acute cor pulmonale: Secondary | ICD-10-CM | POA: Diagnosis present

## 2022-04-07 DIAGNOSIS — Z7982 Long term (current) use of aspirin: Secondary | ICD-10-CM

## 2022-04-07 DIAGNOSIS — Z79890 Hormone replacement therapy (postmenopausal): Secondary | ICD-10-CM

## 2022-04-07 DIAGNOSIS — Z7901 Long term (current) use of anticoagulants: Secondary | ICD-10-CM

## 2022-04-07 DIAGNOSIS — E669 Obesity, unspecified: Secondary | ICD-10-CM | POA: Insufficient documentation

## 2022-04-07 DIAGNOSIS — Z7984 Long term (current) use of oral hypoglycemic drugs: Secondary | ICD-10-CM

## 2022-04-07 DIAGNOSIS — F332 Major depressive disorder, recurrent severe without psychotic features: Principal | ICD-10-CM | POA: Diagnosis present

## 2022-04-07 DIAGNOSIS — Z87891 Personal history of nicotine dependence: Secondary | ICD-10-CM

## 2022-04-07 DIAGNOSIS — F411 Generalized anxiety disorder: Secondary | ICD-10-CM | POA: Diagnosis present

## 2022-04-07 HISTORY — DX: Major depressive disorder, single episode, unspecified: F32.9

## 2022-04-07 LAB — CBC WITH DIFF
BASOPHIL #: 0.05 10*3/uL (ref 0.00–0.30)
BASOPHIL %: 1 % (ref 0–3)
EOSINOPHIL #: 0.29 10*3/uL (ref 0.00–0.80)
EOSINOPHIL %: 3 % (ref 0–7)
HCT: 36.6 % — ABNORMAL LOW (ref 37.0–47.0)
HGB: 12.5 g/dL (ref 12.5–16.0)
LYMPHOCYTE #: 2.58 10*3/uL (ref 1.10–5.00)
LYMPHOCYTE %: 25 % (ref 25–45)
MCH: 30 pg (ref 27.0–32.0)
MCHC: 34.3 g/dL (ref 32.0–36.0)
MCV: 87.6 fL (ref 78.0–99.0)
MONOCYTE #: 0.87 10*3/uL (ref 0.00–1.30)
MONOCYTE %: 8 % (ref 0–12)
MPV: 8.3 fL (ref 7.4–10.4)
NEUTROPHIL #: 6.51 10*3/uL (ref 1.80–8.40)
NEUTROPHIL %: 63 % (ref 40–76)
PLATELETS: 312 10*3/uL (ref 140–440)
RBC: 4.18 10*6/uL — ABNORMAL LOW (ref 4.20–5.40)
RDW: 15.1 % — ABNORMAL HIGH (ref 11.6–14.8)
WBC: 10.3 10*3/uL (ref 4.0–10.5)

## 2022-04-07 LAB — POC BLOOD GLUCOSE (RESULTS): GLUCOSE, POC: 110 mg/dl (ref 50–500)

## 2022-04-07 LAB — URINALYSIS, MACRO/MICRO
BILIRUBIN: NEGATIVE mg/dL
BLOOD: NEGATIVE mg/dL
GLUCOSE: NEGATIVE mg/dL
KETONES: NEGATIVE mg/dL
NITRITE: NEGATIVE
PH: 6 (ref 4.6–8.0)
PROTEIN: NEGATIVE mg/dL
SPECIFIC GRAVITY: <=1.005 (ref 1.003–1.035)
UROBILINOGEN: 0.2 mg/dL (ref 0.2–1.0)

## 2022-04-07 LAB — COMPREHENSIVE METABOLIC PANEL, NON-FASTING
ALBUMIN/GLOBULIN RATIO: 0.9 (ref 0.8–1.4)
ALBUMIN: 3.8 g/dL (ref 3.4–5.0)
ALKALINE PHOSPHATASE: 74 U/L (ref 46–116)
ALT (SGPT): 26 U/L (ref <=78)
ANION GAP: 8 mmol/L (ref 4–13)
AST (SGOT): 21 U/L (ref 15–37)
BILIRUBIN TOTAL: 0.2 mg/dL (ref 0.2–1.0)
BUN/CREA RATIO: 18
BUN: 21 mg/dL — ABNORMAL HIGH (ref 7–18)
CALCIUM, CORRECTED: 9.6 mg/dL
CALCIUM: 9.4 mg/dL (ref 8.5–10.1)
CHLORIDE: 99 mmol/L (ref 98–107)
CO2 TOTAL: 29 mmol/L (ref 21–32)
CREATININE: 1.2 mg/dL — ABNORMAL HIGH (ref 0.55–1.02)
ESTIMATED GFR: 47 mL/min/{1.73_m2} — ABNORMAL LOW (ref >59)
GLOBULIN: 4.2
GLUCOSE: 147 mg/dL — ABNORMAL HIGH (ref 74–106)
OSMOLALITY, CALCULATED: 278 mosm/kg (ref 270–290)
POTASSIUM: 4.3 mmol/L (ref 3.5–5.1)
PROTEIN TOTAL: 8 g/dL (ref 6.4–8.2)
SODIUM: 136 mmol/L (ref 136–145)

## 2022-04-07 LAB — SALICYLATE ACID LEVEL: SALICYLATE LEVEL: 1 mg/dL — ABNORMAL LOW (ref 3–20)

## 2022-04-07 LAB — URINE DRUG SCREEN
AMPHETAMINES URINE: NEGATIVE
BARBITURATES URINE: NEGATIVE
BENZODIAZEPINES URINE: NEGATIVE
CANNABINOIDS URINE: NEGATIVE
COCAINE METABOLITES URINE: NEGATIVE
METHADONE URINE: NEGATIVE
OPIATES URINE: NEGATIVE
PCP URINE: NEGATIVE

## 2022-04-07 LAB — THYROID STIMULATING HORMONE (SENSITIVE TSH): TSH: 2.171 u[IU]/mL (ref 0.358–3.740)

## 2022-04-07 LAB — ACETAMINOPHEN LEVEL: ACETAMINOPHEN LEVEL: 0

## 2022-04-07 LAB — URINALYSIS, MICROSCOPIC

## 2022-04-07 LAB — ETHANOL, SERUM/PLASMA: ETHANOL: <3 mg/dL (ref <=3)

## 2022-04-07 MED ORDER — ACETAMINOPHEN 325 MG TABLET
650.0000 mg | ORAL_TABLET | ORAL | Status: DC | PRN
Start: 2022-04-07 — End: 2022-04-11
  Administered 2022-04-08 – 2022-04-09 (×2): 650 mg via ORAL
  Filled 2022-04-07 (×2): qty 2

## 2022-04-07 MED ORDER — MAGNESIUM HYDROXIDE 400 MG/5 ML ORAL SUSPENSION
30.0000 mL | Freq: Every evening | ORAL | Status: DC | PRN
Start: 2022-04-07 — End: 2022-04-11

## 2022-04-07 MED ORDER — INSULIN REGULAR HUMAN 100 UNIT/ML INJECTION SSIP
0.0000 [IU] | INJECTION | Freq: Four times a day (QID) | SUBCUTANEOUS | Status: DC | PRN
Start: 2022-04-07 — End: 2022-04-11

## 2022-04-07 MED ORDER — ALUMINUM-MAG HYDROXIDE-SIMETHICONE 200 MG-200 MG-20 MG/5 ML ORAL SUSP
30.0000 mL | ORAL | Status: DC | PRN
Start: 2022-04-07 — End: 2022-04-11

## 2022-04-07 MED ORDER — GLUCAGON 1 MG/ML SOLUTION FOR INJECTION
1.0000 mg | INTRAMUSCULAR | Status: DC | PRN
Start: 2022-04-07 — End: 2022-04-11

## 2022-04-07 MED ORDER — DEXTROSE 50 % IN WATER (D50W) INTRAVENOUS SYRINGE
12.5000 g | INJECTION | INTRAVENOUS | Status: DC | PRN
Start: 2022-04-07 — End: 2022-04-11

## 2022-04-07 MED ORDER — DEXTROSE 50 % IN WATER (D50W) INTRAVENOUS SYRINGE
25.0000 g | INJECTION | INTRAVENOUS | Status: DC | PRN
Start: 2022-04-07 — End: 2022-04-11

## 2022-04-07 MED ORDER — TRAZODONE 50 MG TABLET
50.0000 mg | ORAL_TABLET | Freq: Every evening | ORAL | Status: DC | PRN
Start: 2022-04-07 — End: 2022-04-11
  Administered 2022-04-08: 50 mg via ORAL
  Filled 2022-04-07: qty 1

## 2022-04-07 NOTE — ED Nurses Note (Signed)
Report called to [facility] at this time. Patient currently being transported by [facility].

## 2022-04-07 NOTE — ED Nurses Note (Signed)
Patient reports depression starting approximately two years ago when her husband passed away, worsening over time. Patient states she has had multiple medical issues recently that has made her depression worse. Patient states she lives alone currently. When asked if she is suicidal patient states "I've thought about it before but I'm a christian and I believe in God and I can't do it because that's a sin. I also can not stand the thought of leaving my children here without me." When asked about a possible plan patient states "I don't have a plan. But I do have access to a gun." Patient very tearful at this time.

## 2022-04-07 NOTE — ED Provider Notes (Signed)
[facility], [facility] Emergency Department  ED Primary Provider Note  History of Present Illness   Chief Complaint   Patient presents with    Psychiatric Evaluation     Kathleen Munoz is a 76 y.o. female who had concerns including Psychiatric Evaluation.  Arrival: The patient arrived by Car complaining of feeling extremely depressed over the last several weeks.  Patient states her husband died almost 2 years ago.  She states it is not the anniversary that is bothering her it is being a lung by herself.  Patient states she is thought of suicide and has a gun at home.  But she stated that she does not want to leave her kids and can not bear the thought of killing herself.  Patient has never attempted any suicidal plans in the past.  Patient denies any hallucinations auditory or visual.  Patient denies any fever chills.  No chest pain or shortness of breath.  No nausea vomiting or diarrhea.  Patient does have a history of a PE and is presently on Eliquis.  She states that occurred about 2 months ago.  Patient denies having any history of depression or suicide before her husband died.     HPI  Review of Systems   Review of Systems   Constitutional:  Positive for activity change and appetite change. Negative for chills and fever.   HENT:  Negative for ear pain and sore throat.    Eyes:  Negative for pain and visual disturbance.   Respiratory:  Negative for cough and shortness of breath.    Cardiovascular:  Negative for chest pain and palpitations.   Gastrointestinal:  Negative for abdominal pain and vomiting.   Genitourinary:  Negative for dysuria and hematuria.   Musculoskeletal:  Negative for arthralgias and back pain.   Skin:  Negative for color change and rash.   Neurological:  Negative for seizures and syncope.   Psychiatric/Behavioral:  Positive for behavioral problems, decreased concentration, sleep disturbance and suicidal ideas.    All other systems reviewed and are  negative.     Historical Data   History Reviewed This Encounter:     Physical Exam   ED Triage Vitals [04/07/22 1743]   BP (Non-Invasive) (!) 132/58   Heart Rate 57   Respiratory Rate 20   Temperature 36.6 C (97.9 F)   SpO2 97 %   Weight 94.3 kg (208 lb)   Height 1.626 m (5' 4")     Physical Exam  Vitals and nursing note reviewed.   Constitutional:       General: She is not in acute distress.     Appearance: She is well-developed. She is obese.   HENT:      Head: Normocephalic and atraumatic.      Right Ear: External ear normal.      Left Ear: External ear normal.      Nose: Nose normal.      Mouth/Throat:      Mouth: Mucous membranes are moist.   Eyes:      Extraocular Movements: Extraocular movements intact.      Conjunctiva/sclera: Conjunctivae normal.      Pupils: Pupils are equal, round, and reactive to light.   Cardiovascular:      Rate and Rhythm: Normal rate and regular rhythm.      Pulses: Normal pulses.      Heart sounds: Normal heart sounds. No murmur heard.  Pulmonary:      Effort: Pulmonary effort  is normal. No respiratory distress.      Breath sounds: Normal breath sounds.   Abdominal:      General: Bowel sounds are normal.      Palpations: Abdomen is soft.      Tenderness: There is no abdominal tenderness.   Musculoskeletal:         General: No swelling. Normal range of motion.      Cervical back: Normal range of motion and neck supple.   Skin:     General: Skin is warm and dry.      Capillary Refill: Capillary refill takes less than 2 seconds.   Neurological:      General: No focal deficit present.      Mental Status: She is alert and oriented to person, place, and time.   Psychiatric:      Comments: Patient is very depressed and teary eyed while sitting in the room.  Patient has her daughter with her.  Patient has no plan on how took commit suicide.       Patient Data      Labs Ordered/Reviewed   COMPREHENSIVE METABOLIC PANEL, NON-FASTING - Abnormal; Notable for the following components:        Result Value    BUN 21 (*)     CREATININE 1.20 (*)     ESTIMATED GFR 47 (*)     GLUCOSE 147 (*)     All other components within normal limits    Narrative:     Estimated Glomerular Filtration Rate (eGFR) is calculated using the CKD-EPI (2021) equation, intended for patients 80 years of age and older. If gender is not documented or "unknown", there will be no eGFR calculation.   SALICYLATE ACID LEVEL - Abnormal; Notable for the following components:    SALICYLATE LEVEL 1 (*)     All other components within normal limits   CBC WITH DIFF - Abnormal; Notable for the following components:    RBC 4.18 (*)     HCT 36.6 (*)     RDW 15.1 (*)     All other components within normal limits   URINALYSIS, MACRO/MICRO - Abnormal; Notable for the following components:    COLOR Light Yellow (*)     LEUKOCYTES Small (*)     All other components within normal limits   URINALYSIS, MICROSCOPIC - Abnormal; Notable for the following components:    BACTERIA Few (*)     WBCS 6-10 (*)     All other components within normal limits   ETHANOL, SERUM/PLASMA - Normal   URINE DRUG SCREEN - Normal   ACETAMINOPHEN LEVEL - Normal   THYROID STIMULATING HORMONE (SENSITIVE TSH) - Normal   URINE CULTURE,ROUTINE   CBC/DIFF    Narrative:     The following orders were created for panel order CBC/DIFF.  Procedure                               Abnormality         Status                     ---------                               -----------         ------  CBC WITH EUMP[[id number]]                Abnormal            Final result                 Please view results for these tests on the individual orders.   URINALYSIS WITH REFLEX MICROSCOPIC AND CULTURE IF POSITIVE    Narrative:     The following orders were created for panel order URINALYSIS WITH REFLEX MICROSCOPIC AND CULTURE IF POSITIVE.  Procedure                               Abnormality         Status                     ---------                               -----------          ------                     URINALYSIS, MACRO/MICRO[[id number]]      Abnormal            Final result               URINALYSIS, MICROSCOPIC[[id number]]      Abnormal            Final result                 Please view results for these tests on the individual orders.     No orders to display     Medical Decision Making         Medical Decision Making  Patient is 76 year old white female complaining feeling very depressed over the past several weeks.  Patient started with depression 2 years ago when her husband died.  She is gotten progressively worse while living at home alone.  She does think about suicide but denies having any type plan.  Patient does have access to a gun at home.  Patient stated she can not bear the thought of leaving her kids.  Patient called the [facility] today and had a conversation with an intake provider.  She was told to come to the ER and get medically cleared.  Patient is on Eliquis for a PE which occurred 2 months ago.  She denies any hematuria or black or bloody stools.  No bleeding or bruising.  Patient will have labs and a urinalysis done.  Once she is medically cleared she will then have a conversation with the [facility] provider.  Depending on whether the patient is accepted will depend on where the patient goes after the ER.    Amount and/or Complexity of Data Reviewed  Labs: ordered.                Clinical Impression   Moderate major depression (CMS HCC) (Primary)   Suicidal ideations       Disposition: Discharged                Clinical Impression   Moderate major depression (CMS HCC) (Primary)   Suicidal ideations       Current Discharge Medication List

## 2022-04-07 NOTE — ED Triage Notes (Signed)
Patient states she need medical clearance [facility] states she has already talk to them.  Depression

## 2022-04-07 NOTE — ED Nurses Note (Signed)
Patient speaking to [facility] at this time

## 2022-04-07 NOTE — ED Nurses Note (Signed)
Patient informed of acceptance to [facility]. All questions answered. Denies any needs at this time.

## 2022-04-08 ENCOUNTER — Encounter (HOSPITAL_PSYCHIATRIC): Payer: Self-pay | Admitting: Psychiatry

## 2022-04-08 ENCOUNTER — Encounter (HOSPITAL_PSYCHIATRIC): Payer: Self-pay

## 2022-04-08 DIAGNOSIS — Z9981 Dependence on supplemental oxygen: Secondary | ICD-10-CM

## 2022-04-08 DIAGNOSIS — E119 Type 2 diabetes mellitus without complications: Secondary | ICD-10-CM

## 2022-04-08 DIAGNOSIS — Z658 Other specified problems related to psychosocial circumstances: Secondary | ICD-10-CM

## 2022-04-08 DIAGNOSIS — Z87891 Personal history of nicotine dependence: Secondary | ICD-10-CM

## 2022-04-08 DIAGNOSIS — Z7901 Long term (current) use of anticoagulants: Secondary | ICD-10-CM

## 2022-04-08 DIAGNOSIS — I10 Essential (primary) hypertension: Secondary | ICD-10-CM

## 2022-04-08 DIAGNOSIS — R45851 Suicidal ideations: Secondary | ICD-10-CM

## 2022-04-08 DIAGNOSIS — Z86711 Personal history of pulmonary embolism: Secondary | ICD-10-CM

## 2022-04-08 DIAGNOSIS — G4730 Sleep apnea, unspecified: Secondary | ICD-10-CM

## 2022-04-08 LAB — TRIGLYCERIDE: TRIGLYCERIDES: 143 mg/dL (ref <=150)

## 2022-04-08 LAB — LIPID PANEL
CHOL/HDL RATIO: 4.3
CHOLESTEROL: 167 mg/dL (ref <200)
HDL CHOL: 39 mg/dL (ref 23–92)
LDL CALC: 99 mg/dL (ref 0–100)
TRIGLYCERIDES: 143 mg/dL (ref <=150)
VLDL CALC: 29 mg/dL (ref 0–50)

## 2022-04-08 LAB — GLUCOSE, NON FASTING: GLUCOSE: 116 mg/dL — ABNORMAL HIGH (ref 74–106)

## 2022-04-08 LAB — POC BLOOD GLUCOSE (RESULTS)
GLUCOSE, POC: 130 mg/dl (ref 50–500)
GLUCOSE, POC: 133 mg/dl (ref 50–500)
GLUCOSE, POC: 112 mg/dl (ref 50–500)
GLUCOSE, POC: 139 mg/dl (ref 50–500)

## 2022-04-08 LAB — HGA1C (HEMOGLOBIN A1C WITH EST AVG GLUCOSE): HEMOGLOBIN A1C: 6.2 % — ABNORMAL HIGH (ref 4.0–6.0)

## 2022-04-08 MED ORDER — LORATADINE 10 MG TABLET
10.0000 mg | ORAL_TABLET | Freq: Every day | ORAL | Status: DC
Start: 2022-04-08 — End: 2022-04-11
  Administered 2022-04-08 – 2022-04-11 (×4): 10 mg via ORAL
  Filled 2022-04-08 (×4): qty 1

## 2022-04-08 MED ORDER — BUSPIRONE 10 MG TABLET
10.0000 mg | ORAL_TABLET | Freq: Two times a day (BID) | ORAL | Status: DC
Start: 2022-04-08 — End: 2022-04-08
  Filled 2022-04-08: qty 1

## 2022-04-08 MED ORDER — LISINOPRIL 20 MG-HYDROCHLOROTHIAZIDE 12.5 MG TABLET
1.0000 | ORAL_TABLET | Freq: Every day | ORAL | Status: DC
Start: 2022-04-08 — End: 2022-04-08

## 2022-04-08 MED ORDER — APIXABAN 5 MG TABLET
5.0000 mg | ORAL_TABLET | Freq: Two times a day (BID) | ORAL | Status: DC
Start: 2022-04-08 — End: 2022-04-11
  Administered 2022-04-08 – 2022-04-11 (×7): 5 mg via ORAL
  Filled 2022-04-08 (×7): qty 1

## 2022-04-08 MED ORDER — MULTIVITAMIN-FERROUS FUMARATE-FOLIC ACID 18 MG-400 MCG TABLET
1.0000 | ORAL_TABLET | Freq: Every day | ORAL | Status: DC
Start: 2022-04-08 — End: 2022-04-11
  Administered 2022-04-08 – 2022-04-11 (×4): 1 via ORAL
  Filled 2022-04-08 (×4): qty 1

## 2022-04-08 MED ORDER — CLONAZEPAM 1 MG TABLET
1.0000 mg | ORAL_TABLET | Freq: Once | ORAL | Status: AC
Start: 2022-04-08 — End: 2022-04-08
  Administered 2022-04-08: 1 mg via ORAL
  Filled 2022-04-08: qty 1

## 2022-04-08 MED ORDER — BUDESONIDE 160 MCG-GLYCOPYR 9 MCG-FORMOT 4.8 MCG/ACTUATION HFA INHALER
2.0000 | INHALATION_SPRAY | Freq: Two times a day (BID) | RESPIRATORY_TRACT | Status: DC
Start: 2022-04-08 — End: 2022-04-11
  Administered 2022-04-08 – 2022-04-11 (×7): 0 via RESPIRATORY_TRACT

## 2022-04-08 MED ORDER — TRAZODONE 100 MG TABLET
200.0000 mg | ORAL_TABLET | Freq: Every evening | ORAL | Status: DC
Start: 2022-04-08 — End: 2022-04-11
  Administered 2022-04-08 – 2022-04-10 (×3): 200 mg via ORAL
  Filled 2022-04-08 (×3): qty 2

## 2022-04-08 MED ORDER — LISINOPRIL 20 MG TABLET
20.0000 mg | ORAL_TABLET | Freq: Every day | ORAL | Status: DC
Start: 2022-04-08 — End: 2022-04-08

## 2022-04-08 MED ORDER — BUSPIRONE 10 MG TABLET
10.0000 mg | ORAL_TABLET | Freq: Once | ORAL | Status: AC
Start: 2022-04-08 — End: 2022-04-08
  Administered 2022-04-08: 10 mg via ORAL

## 2022-04-08 MED ORDER — BUPROPION HCL SR 150 MG TABLET,12 HR SUSTAINED-RELEASE
150.0000 mg | ORAL_TABLET | Freq: Two times a day (BID) | ORAL | Status: DC
Start: 2022-04-08 — End: 2022-04-11
  Administered 2022-04-08 – 2022-04-11 (×7): 150 mg via ORAL
  Filled 2022-04-08 (×7): qty 1

## 2022-04-08 MED ORDER — IPRATROPIUM BROMIDE 21 MCG (0.03 %) NASAL SPRAY
2.0000 | Freq: Two times a day (BID) | NASAL | Status: DC | PRN
Start: 2022-04-08 — End: 2022-04-11

## 2022-04-08 MED ORDER — PANTOPRAZOLE 40 MG TABLET,DELAYED RELEASE
40.0000 mg | DELAYED_RELEASE_TABLET | Freq: Every day | ORAL | Status: DC
Start: 2022-04-08 — End: 2022-04-11
  Administered 2022-04-08 – 2022-04-11 (×4): 40 mg via ORAL
  Filled 2022-04-08 (×4): qty 1

## 2022-04-08 MED ORDER — HYDROCHLOROTHIAZIDE 25 MG TABLET
12.5000 mg | ORAL_TABLET | Freq: Every day | ORAL | Status: DC
Start: 2022-04-08 — End: 2022-04-11
  Administered 2022-04-08 – 2022-04-11 (×4): 12.5 mg via ORAL
  Filled 2022-04-08 (×4): qty 1

## 2022-04-08 MED ORDER — ALBUTEROL SULFATE HFA 90 MCG/ACTUATION AEROSOL INHALER - RN
2.0000 | Freq: Four times a day (QID) | RESPIRATORY_TRACT | Status: DC | PRN
Start: 2022-04-08 — End: 2022-04-11

## 2022-04-08 MED ORDER — METOPROLOL SUCCINATE ER 25 MG TABLET,EXTENDED RELEASE 24 HR
25.0000 mg | ORAL_TABLET | Freq: Every day | ORAL | Status: DC
Start: 2022-04-08 — End: 2022-04-11
  Administered 2022-04-08 – 2022-04-09 (×2): 25 mg via ORAL
  Administered 2022-04-09: 0 mg via ORAL
  Administered 2022-04-10 – 2022-04-11 (×2): 25 mg via ORAL
  Filled 2022-04-08 (×5): qty 1

## 2022-04-08 MED ORDER — COLLAGENASE CLOSTRIDIUM HISTOLYTICUM 250 UNIT/GRAM TOPICAL OINTMENT
TOPICAL_OINTMENT | Freq: Every day | CUTANEOUS | Status: DC
Start: 2022-04-08 — End: 2022-04-11
  Administered 2022-04-08: 0 g via TOPICAL
  Filled 2022-04-08: qty 30

## 2022-04-08 MED ORDER — LISINOPRIL 20 MG TABLET
20.0000 mg | ORAL_TABLET | Freq: Every day | ORAL | Status: DC
Start: 2022-04-08 — End: 2022-04-11
  Administered 2022-04-08 – 2022-04-09 (×2): 20 mg via ORAL
  Administered 2022-04-09: 0 mg via ORAL
  Administered 2022-04-10 – 2022-04-11 (×2): 20 mg via ORAL
  Filled 2022-04-08 (×5): qty 1

## 2022-04-08 MED ORDER — LEVOTHYROXINE 88 MCG TABLET
88.0000 ug | ORAL_TABLET | Freq: Every morning | ORAL | Status: DC
Start: 2022-04-08 — End: 2022-04-11
  Administered 2022-04-08 – 2022-04-11 (×4): 88 ug via ORAL
  Filled 2022-04-08 (×4): qty 1

## 2022-04-08 MED ORDER — ASPIRIN 81 MG CHEWABLE TABLET
81.0000 mg | CHEWABLE_TABLET | Freq: Every day | ORAL | Status: DC
Start: 2022-04-08 — End: 2022-04-11
  Administered 2022-04-08 – 2022-04-11 (×4): 81 mg via ORAL
  Filled 2022-04-08 (×4): qty 1

## 2022-04-08 MED ORDER — CITALOPRAM 40 MG TABLET
40.0000 mg | ORAL_TABLET | Freq: Every day | ORAL | Status: DC
Start: 2022-04-08 — End: 2022-04-11
  Administered 2022-04-08 – 2022-04-11 (×4): 40 mg via ORAL
  Filled 2022-04-08 (×4): qty 1

## 2022-04-08 MED ORDER — CLONAZEPAM 1 MG TABLET
1.0000 mg | ORAL_TABLET | Freq: Three times a day (TID) | ORAL | Status: DC
Start: 2022-04-08 — End: 2022-04-11
  Administered 2022-04-08 – 2022-04-11 (×9): 1 mg via ORAL
  Filled 2022-04-08 (×9): qty 1

## 2022-04-08 MED ORDER — BUSPIRONE 10 MG TABLET
10.0000 mg | ORAL_TABLET | Freq: Three times a day (TID) | ORAL | Status: DC
Start: 2022-04-08 — End: 2022-04-11
  Administered 2022-04-08 – 2022-04-11 (×9): 10 mg via ORAL
  Filled 2022-04-08 (×9): qty 1

## 2022-04-08 MED ORDER — GABAPENTIN 400 MG CAPSULE
400.0000 mg | ORAL_CAPSULE | Freq: Once | ORAL | Status: AC
Start: 2022-04-08 — End: 2022-04-08
  Administered 2022-04-08: 400 mg via ORAL
  Filled 2022-04-08: qty 1

## 2022-04-08 MED ORDER — APIXABAN 5 MG TABLET
10.0000 mg | ORAL_TABLET | Freq: Two times a day (BID) | ORAL | Status: DC
Start: 2022-04-08 — End: 2022-04-08

## 2022-04-08 MED ORDER — LINAGLIPTIN 5 MG TABLET
5.0000 mg | ORAL_TABLET | Freq: Every day | ORAL | Status: DC
Start: 2022-04-08 — End: 2022-04-11
  Administered 2022-04-08 – 2022-04-11 (×4): 5 mg via ORAL
  Filled 2022-04-08 (×4): qty 1

## 2022-04-08 MED ORDER — BUPROPION HCL SR 100 MG TABLET,12 HR SUSTAINED-RELEASE
100.0000 mg | ORAL_TABLET | Freq: Two times a day (BID) | ORAL | Status: DC
Start: 2022-04-08 — End: 2022-04-08
  Filled 2022-04-08: qty 1

## 2022-04-08 MED ORDER — GABAPENTIN 400 MG CAPSULE
400.0000 mg | ORAL_CAPSULE | Freq: Three times a day (TID) | ORAL | Status: DC
Start: 2022-04-08 — End: 2022-04-11
  Administered 2022-04-08 – 2022-04-11 (×9): 400 mg via ORAL
  Filled 2022-04-08 (×9): qty 1

## 2022-04-08 NOTE — Consults (Signed)
[facility]  Hospitalist Consultation    Date of Service:  04/08/2022  Kathleen Munoz   75 y.o. female  Date of Admission:  04/07/2022  Date of Birth:  02-Jun-1946  1         History of Present Illness:  Patient is a 76 year old Caucasian female who is here for severe depression and bereavement.  Patient has a history of diabetes, pulmonary embolism and is on Eliquis, hypertension.  She is O2 dependent at this time due to her pulmonary embolism and takes CPAP.  She also has a wound on her right outer ankle that has been followed by Podiatry.  She fell asleep with a heating pad on and burned herself significantly.  This wound is old and is well scab and needs no acute treatments although she is supposed to be taking Santyl once daily which I will try to continue while she is here.  She follows up with Dr. Dortha Kern next week and states she is due for another echocardiogram.  She is having no cardiac issues at this time.                  History:    Past Medical:    Past Medical History:   Diagnosis Date    Anxiety     Arthritis     Depression     Diabetes mellitus, type 2 (CMS HCC)     Esophageal reflux     H/O blood clots     LUNGS    Hypertension     Personal history of fall     Shortness of breath     Sleep apnea     CPAP    Thyroid disease       Past Surgical:    Past Surgical History:   Procedure Laterality Date    HAND SURGERY Left     HX BUNIONECTOMY      HX TUBAL LIGATION        Family:    Family Medical History:    None        Social:   reports that she has quit smoking. Her smoking use included cigarettes. She has a 15.00 pack-year smoking history. She has been exposed to tobacco smoke. She has never used smokeless tobacco. She reports that she does not currently use alcohol. She reports that she does not use drugs.     REVIEW OF SYSTEMS:  Full ROS performed. ROS negative if not mentioned in HPI.    Allergies   Allergen Reactions    Influenza Virus Vaccines         Medications:  Medications Prior to Admission       Prescriptions    albuterol sulfate (PROVENTIL OR VENTOLIN OR PROAIR) 90 mcg/actuation Inhalation oral inhaler    Take 1-2 Puffs by inhalation Every 6 hours as needed for Other (SOB)    apixaban (ELIQUIS) 5 mg Oral Tablet    Take 2 Tablets (10 mg total) by mouth Twice daily For 6 more days with last dose at 9pm, then take 61m (1) tab 2x daily as maintenance dose. This will be for the first thirty days    Patient taking differently:  Take 1 Tablet (5 mg total) by mouth Twice daily    aspirin 81 mg Oral Tablet, Chewable    Chew 1 Tablet (81 mg total) Once a day    budesonide-glycopyr-formoterol (BREZTRI AEROSPHERE) 160-9-4.8 mcg/actuation Inhalation HFA Aerosol Inhaler    Take 2  Puffs by inhalation Twice daily    buPROPion (WELLBUTRIN SR) 100 mg Oral tablet sustained-release 12 hr    Take 1 Tablet (100 mg total) by mouth Twice daily    busPIRone (BUSPAR) 10 mg Oral Tablet    Take 1 Tablet (10 mg total) by mouth Twice daily    clonazePAM (KLONOPIN) 1 mg Oral Tablet    Take 1 Tablet (1 mg total) by mouth Three times a day    collagenase ointment (SANTYL) 250 unit/gram Ointment    Apply topically Once a day    fexofenadine (ALLEGRA) 180 mg Oral Tablet    Take 1 Tablet (180 mg total) by mouth Once a day    gabapentin (NEURONTIN) 300 mg Oral Capsule    Take 400 mg by mouth Three times a day    ipratropium bromide (ATROVENT) 21 mcg (0.03 %) Nasal nasal spray    ADMINISTER 2 SPRAYS INTO AFFECTED NOSTRIL(S) TWICE PER DAY AS NEEDED    levothyroxine (SYNTHROID) 88 mcg Oral Tablet    Take 1 Tablet (88 mcg total) by mouth Every morning    lisinopriL (PRINIVIL) 20 mg Oral Tablet    Take 1 Tablet (20 mg total) by mouth Once a day    Patient not taking:  Reported on 04/08/2022    lisinopriL-hydrochlorothiazide (ZESTORETIC) 20-12.5 mg Oral Tablet    Take 1 Tablet by mouth Once a day    metoprolol succinate (TOPROL-XL) 25 mg Oral Tablet Sustained Release 24 hr    Take 1 Tablet (25  mg total) by mouth Once a day    multivitamin Oral Capsule    Take 1 Capsule by mouth Once a day    omeprazole (PRILOSEC) 40 mg Oral Capsule, Delayed Release(E.C.)    Take 1 Capsule (40 mg total) by mouth Twice daily    SITagliptin phosphate (JANUVIA) 100 mg Oral Tablet    Take by mouth Once a day    traZODone (DESYREL) 150 mg Oral Tablet    Take 200 mg by mouth Every night          acetaminophen (TYLENOL) tablet, 650 mg, Oral, Q4H PRN  albuterol 90 mcg per inhalation oral inhaler - "Nursing to administer", 2 Puff, Inhalation, Q6H PRN  aluminum-magnesium hydroxide-simethicone (MAG-AL PLUS) 200-200-20 mg per 5 mL oral liquid, 30 mL, Oral, Q4H PRN  apixaban (ELIQUIS) tablet, 5 mg, Oral, 2x/day  aspirin chewable tablet 81 mg, 81 mg, Oral, Daily  budesonide-glycopyr-formot (BREZTRI AEROSPHERE) 160 mcg-9 mcg-4.8 mcg per inhalation oral inhaler - "Respiratory to administer", 2 Puff, Inhalation, 2x/day  buPROPion (WELLBUTRIN SR) sustained release tablet, 150 mg, Oral, 2x/day  busPIRone (BUSPAR) tablet, 10 mg, Oral, 3x/day  citalopram (CeleXA) tablet, 40 mg, Oral, Daily  clonazePAM (klonoPIN) tablet, 1 mg, Oral, 3x/day  collagenase (SANTYL) 250 unit/gm ointment, , Apply Topically, Daily  dextrose 50% (0.5 g/mL) injection - syringe, 25 g, Intravenous, Q15 Min PRN   Or  dextrose 50% (0.5 g/mL) injection - syringe, 12.5 g, Intravenous, Q15 Min PRN   Or  glucagon (GLUCAGEN DIAGNOSTIC KIT) injection 1 mg, 1 mg, Subcutaneous, Q15 Min PRN   Or  glucagon (GLUCAGEN DIAGNOSTIC KIT) injection 1 mg, 1 mg, IntraMUSCULAR, Q15 Min PRN  gabapentin (NEURONTIN) capsule, 400 mg, Oral, 3x/day  lisinopril (PRINIVIL) tablet, 20 mg, Oral, Daily   And  hydroCHLOROthiazide (HYDRODIURIL) tablet, 12.5 mg, Oral, Daily  ipratropium (ATROVENT) 0.03% nasal spray, 2 Spray, Nasal, 2x/day PRN  levothyroxine (SYNTHROID) tablet, 88 mcg, Oral, QAM  linagliptin (TRADJENTA) tablet, 5 mg, Oral,  Daily  loratadine (CLARITIN) tablet, 10 mg, Oral, Daily  magnesium  hydroxide (MILK OF MAGNESIA) 490m per 549moral liquid, 30 mL, Oral, HS PRN  metoprolol succinate (TOPROL-XL) 24 hr extended release tablet, 25 mg, Oral, Daily  multivitamin-iron-folic acid (CEROVITE ADVANCED FORMULA) tablet, 1 Tablet, Oral, Daily  pantoprazole (PROTONIX) delayed release tablet, 40 mg, Oral, Daily  SSIP insulin R human (HumuLIN R) 100 units/mL injection, 0-12 Units, Subcutaneous, 4x/day PRN  traZODone (DESYREL) tablet, 50 mg, Oral, HS PRN - MR x 1  traZODone (DESYREL) tablet, 200 mg, Oral, NIGHTLY        Physical Exam:  Vitals:  Temperature: 36.3 C (97.3 F)  Heart Rate: 85  Respiratory Rate: (!) 21  BP (Non-Invasive): (!) 145/75  SpO2: 98 %    Exam:  Nursing note and vitals reviewed.   General: No acute distress, alert and oriented x3  HEENT: Pharynx without no erythema, exudate. PERRLA, conjunctivae are without injection or scleral icterus  Neck: Neck supple without lymphadenopathy. Thyroid non-tender and without nodules.  Cardio: Regular rate and rhythm. Normal S1 and S2. No murmurs, gallops, or rubs. PMI located in the midclavicular line.   Resp: Clear to auscultation bilaterally. No wheezes, rales, rhonchi or crackles. Normal resp effort.  Abd: Bowel sounds present in all 4 quadrants. Negative murphy's sign. No rebound tenderness or involuntary guarding. Liver and spleen not palpable.  GU: Deferred exam  Extremities: No edema or swelling noted. No gait disturbances or weakness. Muscle strength 5/5 in bilateral upper and lower extremities.  Skin:  Old wound on right outer ankle.  We will continue Santyl.  Neuro: CN II - XII grossly intact  I: smell Not tested   II: visual acuity  Visual acuity appears normal bilaterally   II: visual fields Full to confrontation   II: pupils Equal, round, reactive to light   III,VII: ptosis none   III,IV,VI: extraocular muscles  Full ROM   V: mastication normal   V: facial light touch sensation  normal   V,VII: corneal reflex  present   VII: facial muscle  function - upper  normal   VII: facial muscle function - lower normal   VIII: hearing Not tested   IX: soft palate elevation  normal   IX,X: gag reflex present   XI: trapezius strength  5/5   XI: sternocleidomastoid strength 5/5   XI: neck flexion strength  5/5   XII: tongue strength  normal      [id number]                      Phone [phone]  [phone]    Fax: [phone]      Labs:     Results for orders placed or performed during the hospital encounter of 04/07/22 (from the past 24 hour(s))   POC BLOOD GLUCOSE (RESULTS)   Result Value Ref Range    GLUCOSE, POC 110 50 - 500 mg/dl   LIPID PANEL   Result Value Ref Range    CHOLESTEROL 167 <200 mg/dL    TRIGLYCERIDES 143 <=150 mg/dL    HDL CHOL 39 23 - 92 mg/dL    LDL CALC 99 0 - 100 mg/dL    VLDL CALC 29 0 - 50 mg/dL    CHOL/HDL RATIO 4.3    HGA1C (HEMOGLOBIN A1C WITH EST  AVG GLUCOSE)   Result Value Ref Range    HEMOGLOBIN A1C 6.2 (H) 4.0 - 6.0 %   GLUCOSE, NON FASTING   Result Value Ref Range    GLUCOSE 116 (H) 74 - 106 mg/dL   TRIGLYCERIDE   Result Value Ref Range    TRIGLYCERIDES 143 <=150 mg/dL   POC BLOOD GLUCOSE (RESULTS)   Result Value Ref Range    GLUCOSE, POC 130 50 - 500 mg/dl   POC BLOOD GLUCOSE (RESULTS)   Result Value Ref Range    GLUCOSE, POC 133 50 - 500 mg/dl       Imaging Studies:        MRI ANKLE RIGHT W/WO CONTRAST  Narrative: Sharmila S Delmar    RADIOLOGIST: Karna Dupes    MRI ANKLE RIGHT W/WO CONTRAST performed on 03/25/2022 10:44 AM    CLINICAL HISTORY: L97.511: Right foot ulcer, limited to breakdown of skin (CMS HCC).  Right foot ulcer, limited to breakdown of skin (CMS HCC)    TECHNIQUE: Multiplanar and multisequence imaging was performed of the right ankle without and with intravenous gadolinium contrast.  INTRAVENOUS CONTRAST: 9 ml's of Gadavist    COMPARISON : 07/30/2019 MRI of the right  foot    FINDINGS:  An approximately 0.8 x 0.7 cm soft tissue ulceration superficial to the lateral malleolus along the lateral aspect of the ankle is present. Small approximately 1.2 x 0.7 cm focal fluid collection along the lateral aspect of the talus is present which was present on 07/30/2019 MRI and is felt to represent a synovial cyst. Small amount of joint fluid just above the intact anterior talofibular ligament is also again seen. No definitive organized drainable soft tissue abscess is seen. There is regional enhancing low T1 skin thickening and underlying enhancing low T1 strandy changes compatible with cellulitis about the lateral aspect of the ankle. Small amount of fluid within the flexor hallux longus tendon sheath is again seen with very small tibiotalar joint effusion again noted when compared to 07/30/2019. Mild strandy subcutaneous edematous changes about the lower leg also partially visualized.    No underlying suspicious marrow signal abnormality or abnormal postcontrast enhancement involving the lateral malleolus is seen to suggest osteomyelitis. No enlarging joint effusion is seen when compared to 07/30/2019 to suggest a septic joint. No discrete tibiotalar osteochondral lesion is identified.   Impression: 1. Skin ulceration superficial to the lateral malleolus with findings of regional cellulitis and lower leg no cutaneous edema.  2. No convincing organized drainable soft tissue abscess.  3. No evidence of underlying osteomyelitis.    Radiologist location ID: [id number]      Assessment/Plan:     Problem List:  Active Hospital Problems   (*Primary Problem)    Diagnosis    *Major depressive disorder       DVT/PE Prophylaxis:  Eliquis      Arline Asp, PA-C  [place] HOSPITALIST

## 2022-04-08 NOTE — Behavioral Health (Signed)
PRN [facility]      Biopsychosocial Assessment    Patient: Kathleen Munoz, Kathleen Munoz, 76 y.o., White, female  Date of Birth:  08-31-1945  MRN: [id number]  Room/Bed: 216/A  Medical Record #: [id number]  Date of Admission: 04/07/2022  Admission Type: Voluntary  Status:  Voluntary    Medical Diagnosis and History:  Patient Active Problem List   Diagnosis    Pulmonary embolism (CMS HCC)    Cellulitis and abscess of right lower extremity    Sleep apnea    Diabetes mellitus, type 2 (CMS HCC)    Major depressive disorder     Past Medical History:   Diagnosis Date    Anxiety     Arthritis     Depression     Diabetes mellitus, type 2 (CMS HCC)     Esophageal reflux     H/O blood clots     LUNGS    Hypertension     Personal history of fall     Shortness of breath     Sleep apnea     CPAP    Thyroid disease          Past Surgical History:   Procedure Laterality Date    HAND SURGERY Left     HX BUNIONECTOMY      HX TUBAL LIGATION               Date Performed: 04/08/2022    Attending Physician:  Dr. Farris Has, MD     Informant: Information was gathered from the patient and clinical record    Chief Complaint / Problems:   Patient is a 76 y.o. White female who is widowed.  Kathleen Munoz is being admitted voluntarily.   The patient is [place] speaking and no communication devices were needed during this assessment.  This is the patient's 1st psychiatric hospitalization.   The patient was hospitalized for suicidal ideations, increased depression and anxiety with an inability to cope.  The patient reports she is lonely and sad all the time, has not been able to stop crying and has poor energy.  The patient reports she is been feeling hopeless and can not find a reason to go on.  The patient reports she just feels like giving up because she has no fight left in her to live.  The patient states all of this has worsened over the past several weeks due to some recent medical issues.  The patient had to be hospitalized because  she had a pulmonary embolism.  The patient also has a burn on her right ankle from a heating pad that is slow to heal.  The patient admitted that her son-in-law's grandmother recently passed away which also brought up all the sadness she is been struggling with the loss of her husband where he passed away in 06-15-20, and her good friend who lives in [place] has been very sick for the past 3 weeks with COVID.  The patient states they have not been able to get together to visit and go out and eat which has added to her loneliness and isolation.    Severity of Stressors and Maladaptive Behavior identified:    Javana rates her depression and anxiety each a 9 on a scale of 1-10 with 10 being the worst.  The patient denies panic attacks, paranoia and racing thoughts.  The patient reports she is been sleeping okay and her appetite is good.  The patient is still having  suicidal thoughts stating she struggling to find a reason to want to live.  The patient reports having guilt over feeling this way because she loves her children and her grandchildren, but she feels consumed by the depression and loneliness to the point they are not a motivation for her to want to keep fighting to live.  The patient denies homicidal ideations, auditory, visual and tactile hallucinations.  The patient is currently established with Dr. Alfonse Ras at [facility] in [place], [place].  The patient was made aware of her right to participate in treatment.       CURRENT FAMILY SITUATION AND PEER GROUP  The patient currently lives in  [place], [place] which is in [place].  The patient states her residence is actually closer to the [employer].  The patient lives alone.  The peer group the patient identifies with is her family and friends.  The patient has been married once in her life for a period of 56 years.  The patient states the relationship with her husband was very good but he died in 06/21/20.  The  patient denies any adult abuse, neglect or exploitation and denies abusing anyone else.  The patient has 2 adult daughters she has a good relationship with.  The patient states her oldest daughter is disabled and lives near the patient.  The patient states her youngest daughter works all the time and is taking care of her family so she does not see her as much as she would like.  The patient's mother and father are both deceased.  The patient states she had a good relationship with her parents prior to them passing away.  The patient reports she is the youngest and has 3 older brothers and 1 older sister.  The patient states she had a good relationship with them prior to their passing.  The patient states all of her brothers and sisters are deceased.  The patient states there is 1 sister-in-law that she is still close to.  The social, ethnic, cultural, emotional and/or health factors currently stressing the patient and the family are:  Her worsening mental health issues and her inability to cope exacerbated by some recent health issues.        Sociocultural Environment and Severity of Stress.   Cristie reports her religious preference is [place], she attends church regularly, she prays and finds prayer helpful.  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  There is no history of Financial planner.  The patient is currently retired as she worked as an Public house manager.  The patient reports financially she is very secure as she receives her own Tree surgeon, a pension and her [employer] pension.  The patient does not have access to community resources as she is not eligible for Medicaid or food stamps.  The patient does receive [employer] Medicare.  The patient denies any history with gambling, current legal charges or any legal history.      Tobacco/Chemical Substance History  The patient denies using alcohol substances or tobacco products.  Nothing in the  patient's chart indicates a substance use issue so no further intervention is needed at this time.      Substance Abuse Treatment History:  Patient denies receiving substance abuse treatment.     Developmental History:  The patient reports that she was born in [place], [place].  The patient's developmental memories of childhood are  positive.  The patient denies any history of physical, psychological or sexual abuse as a child.  The patient reports her father was an alcoholic which made her childhood challenging at times but overall it was still positive.  The patient denies any unusual childhood illnesses, injuries or diseases.    Please see the dictated H&P for full listing of the patient's medical issues.    Activity Assessment and Discharge Recommendations:  The patient reports her current hobbies and interests are watching TV, scrolling through social media and reading.  The patient states her level of activity has significantly decreased due to loss of interest and enjoyment in these activities as well as loss of the ability to focus or to complete tasks.  The patient struggles with social isolation and needs activities to improve her socialization and relaxation skills.  The patient also needs activities to improve her mood and affect and she struggles with depression and anxiety.  The patient would also benefit from activities that would provide her with positive outlets for her feelings and emotions due to her suicidal thoughts.    Primary Therapist Assessment:  The patient was rational and coherent during the assessment.  There are no overt signs of psychosis.  The patient denies suicidal ideations but then endorses that she is still having thoughts of having no motivation or finding any reason to go on living so she does not know if she wants to.  The patient is very depressed and tearful.  The patient's behavior is cooperative.  The patient is dressed in street clothes and appears comfortable and  well kept.  The patient's tone of voice is normal.  The patient makes good eye contact during the assessment.  Patient's estimated intelligence level is above average.  The patient's insight and judgment are fair.  There is no issues with recent or remote memories.  There is no evidence of historical psychosis or delusional thinking.  The patient self-esteem and ego strength are limited.  The patient's strengths are her stable housing and financial security.  The patient's weaknesses are motivation, support systems and coping skills.    Plan:  While on the unit, the patient will be involved in individual, group, activity, psychoactive and milieu therapies in order to reduce her symptoms to a more manageable and functional level.  The patient declined to have a family conference as she reports she communicates well with her family.  The patient is established with Dr. Aldona Lento at [facility] in [place], [place], so follow-up will be scheduled with this established provider.  A new referral will be made to the [facility] program as the patient discussed interest in this program and feels she may receive some benefit from it.  The patient mentioned that she feels the only time she gets to leave her house anymore is just to go to doctor's appointments, and this has been distressing to her.  Counselor again reiterated the benefit of the SOP program, so the patient is agreeable to the referral and possibly enrolling in his program.    Discharge Plan:    [place] will return her residence.  Outpatient services will be scheduled and patient will be encouraged to participate and follow through with outpatient treatment.        Safety Plan:  Safety plan given to patient to be discussed and completed with counselor prior to discharge from facility.       Transportation:  Upon discharge, Drina will be transported by 1 of her daughters.

## 2022-04-08 NOTE — Nurses Notes (Signed)
Pharmacy needed clarification on Eliquis dosage and patient being listed as being on both zestoretic and lisinopril, called [facility] and spoke to [place] and she confirmed patient is currently on Eliquis 5mg  po bid and is on the zestoretic 20/12.5 not lisinopril. Notified hospitalist and pharmacy of this and orders corrected

## 2022-04-08 NOTE — Nurses Notes (Signed)
7P-7A SHIFT NOTE: PT UP AD LIB ON UNIT WITH WALKER AND STEADY GAIT. PT IS COOPERATIVE BUT IRRITATED. RATES ANXIETY 10/10 AND DEPRESSION 10/10. PT DENIES SI/HI/HALLUCINATIONS. PT TOOK ALL MEDICATIONS THIS SHIFT WITHOUT DIFFICULTY. SNACKS AND DRINKS PROVIDED. NO ACUTE DISTRESS NOTED. STAFF WILL CONTINUE TO VISUALLY MONITOR Q15 MIN AND PRN THROUGHOUT SHIFT

## 2022-04-08 NOTE — Care Plan (Signed)
New admit this shift.     Problem: Adult Behavioral Health Plan of Care  Goal: Plan of Care Review  Outcome: Ongoing (see interventions/notes)  Goal: Patient-Specific Goal (Individualization)  Outcome: Ongoing (see interventions/notes)  Flowsheets (Taken 04/08/2022 0005)  Individualized Care Needs: coping strategies  Anxieties, Fears or Concerns: reports she gets anxious and depressed when she is by herself  Patient-Specific Goals (Include Timeframe): get to feeling better and improve depression  Goal: Adheres to Safety Considerations for Self and Others  Outcome: Ongoing (see interventions/notes)  Goal: Absence of New-Onset Illness or Injury  Outcome: Ongoing (see interventions/notes)  Intervention: Prevent VTE (Venous Thromboembolism)  Recent Flowsheet Documentation  Taken 04/08/2022 0005 by Renae Gloss, RN  VTE Prevention/Management:   ambulation promoted   anticoagulant therapy maintained  Intervention: Prevent Infection  Recent Flowsheet Documentation  Taken 04/08/2022 0005 by Renae Gloss, RN  Infection Prevention:   promote handwashing   rest/sleep promoted  Goal: Optimized Coping Skills in Response to Life Stressors  Outcome: Ongoing (see interventions/notes)  Goal: Develops/Participates in Therapeutic Alliance to Support Successful Transition  Outcome: Ongoing (see interventions/notes)  Intervention: [place] Documentation  Taken 04/08/2022 0005 by Renae Gloss, RN  Trust Relationship/Rapport:   care explained   emotional support provided   choices provided   empathic listening provided   questions answered   questions encouraged   reassurance provided   thoughts/feelings acknowledged  Goal: Rounds/Family Conference  Outcome: Ongoing (see interventions/notes)     Problem: Depression  Goal: Improved Mood  Outcome: Ongoing (see interventions/notes)

## 2022-04-08 NOTE — H&P (Signed)
Psych eval  Date of Admission:  04/07/2022  Reason for hospitalization:  Depression, anxiety, suicidal ideation, inability to cope  Type of admission:  Voluntary  Chief Complaint:  "I just do not know why "  HPI:  [name] is a 76 year old white female with a 20+ year history of depression admitted on a voluntary basis we [facility] where she presented with depression, suicidal ideation, tearfulness, sadness, and bereavement.  Patient's husband died about 2 years ago since that time she has had difficult time with escalating symptoms in the past few weeks.  She has had some suicidal thoughts with no intent.  She does have a gun at home.  Family has been very supportive and been worried about the patient.  They have encouraged her to seek inpatient treatment.  The patient is not use drugs or alcohol.  She has seen Dr. Lenice Llamas her house for many years.  She is taking medications but has had continuing depressive symptomatology.  Complicating factors include recent medical issues including pulmonary embolism as well as a burn on her right ankle from a heating pad.  That has been slow to heal.  Patient was evaluated in deemed medically stable.  Drug screen was negative.  Labs were unremarkable.  Patient's biggest symptoms include feeling tearful, lonely, sad, hopeless, helpless, and having no energy.  She has very few hobbies and is lonely at home as she lives in the home where her husband resided.  She denies any recent illnesses, injuries, head traumas.  Past psych history:  Patient has no previous inpatient psychiatric treatment history.  She has seen Dr. Lolita Cram at [facility] for the past 20 years.  She has diagnosis of depression, anxiety.  She has no history of mania, PTSD, OCD, panic disorder, eating disorder.  No history of self-harm behavior.  No history of substance use issues.  No history of detox treatment.  She cannot name past medications that she has been prescribed.  Past medical  history:  Patient is allergic to flu vaccine.  She has a history of pulmonary embolism, cellulitis, increased LFTs, diabetes mellitus type 2, sleep apnea, arthritis, incontinence, tubal ligation, left hand surgery, she has a burn on her right foot that has been slow to heal for eating pad injury.  She has no history of seizures, loss of conscious, or head trauma.  Physical Exam/ ROS: Please see dictated hospitalist note dated 04/08/2022  Family Psychiatric History:  Negative for suicides, positive for alcoholism in father, no other mental illness  Family Medical History:  Cancer, coronary artery disease, myocardial infarction  Social History:  Patient born raised in [place].  She had a good childhood.  She has no history of physical or sexual abuse.  Some stress with her father being an alcoholic.  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  She is retired.  She was married for 73 years her husband passed away 2 years ago.  They have 2 daughters.  She lives alone.  She has very few hobbies.  She has no military service history, no legal problems.  MSE:  The patient is alert and oriented x4, casually dressed, fair eye contact, disheveled a bit, appearing stated age.  Speech is normal rate and tone.  Patient is talkative and personable. There is no flight of ideas, loosening of associations, or tangential speech.  Not manic.  Mood is sad.  Affect congruent.  Patient does not appear to be in any acute physical distress.  +  suicidal ideations, no homicidal ideation.  No auditory or visual hallucinations, no delusions, no paranoia.  No signs of psychosis.  No current plans to harm self, no plans to harm others.  Patient is not aggressive or threatening.  No psychomotor agitation.  No psychomotor retardation.  No abnormal involuntary movements. Thoughts are linear, logical, and goal directed.  Intellectual functioning is good.  Memory is intact to recent, remote, and past events.  Patient can recall 3 of 3  objects at 0 and 5 minutes, and what was eaten for last meal.  Patient is able to provide details of current situation.  Patient can name the president, vice president, and governor.  Language is good.  Vocabulary is unimpaired, no word finding difficulty or word misuse.  Intelligence is good, patient can interpret a proverb, and reports apple and orange similarity. Calculation is unimpaired.  Concentration is good, able to recite days of week forward and backward.  Insight is good; patient is aware of their illness, how it affects their functioning, and what needs to happen for future improvement.  Judgment is good; patient is compliant with treatment and can relate appropriately to what they would do if smelling smoke in a theater, or finding stamped addressed envelope.  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  She has a history of pulmonary embolism, cellulitis, increased LFTs, diabetes mellitus type 2, sleep apnea, arthritis, incontinence, tubal ligation, left hand surgery, she has a burn on her right foot that has been slow to heal for eating pad injury.  She has no history of seizures, loss of conscious, or head trauma.  Axis 4: Severe psychosocial stressors  Axis 5: Admission GAF:  20   , best in past year:  Unknown  Patient strengths and weaknesses: Please see counselor's psychosocial evaluation  Provisional Discharge Plan:  Home with outpatient follow-up  Plan: The patient will be admitted for stabilization.  Medicines will be instituted and adjusted.  I am going to adjust the patient's antidepressant medications upwards.  I will continue BuSpar with an increased dose to t.i.d..  Continue Klonopin.  We have discussed medicine side-effects, treatment options.  I have asked the patient to consider structure outpatient program referral at discharge.  The  patient agrees with the plan and will be encouraged to be active in groups and milieu treatment.  Patient voices understanding and agrees with the plan.  Patient aware of unit rules and regulations and will be seen by the hospitalist for medical issues.  Further changes will be dictated by clinical course.   I estimate patient length of stay will be 7-10 days.  Time taken for dictation, treatment team staffing, review of documents, medication reconciliation, interview the patient, required documentation, and clinical decision-making exceeded 75 minutes on the day of admission.  I certify that the inpatient psychiatric admission is medically necessary for (1) treatment which is reasonably expected to improve the patient's condition, and (2) for diagnostic studies.    Vickki Hearing, MD

## 2022-04-08 NOTE — Nurses Notes (Signed)
7a-7p Nurse Shift Note    Patient up and about on unit this shift but isolates frequently to room. Patient alert and oriented, well-groomed in appearance. Patient denies any SI or HI, or A/V hallucinations, delusions. Patient is really tearful this AM over medications. Staff reassured patient that medications were being verified by pharmacy. Medications given and patient noted to calm down. Patient reports anxiety 10/10 and depression 10/10. Patient reports good sleep and good appetite. Patient reports last BM - 04/08/22. Snacks and drinks provided. Patient offered linens, toiletries for personal hygiene. Will continue to monitor with Q15 minute and PRN visual safety checks.

## 2022-04-09 DIAGNOSIS — F329 Major depressive disorder, single episode, unspecified: Secondary | ICD-10-CM

## 2022-04-09 LAB — POC BLOOD GLUCOSE (RESULTS)
GLUCOSE, POC: 120 mg/dl (ref 50–500)
GLUCOSE, POC: 125 mg/dl (ref 50–500)
GLUCOSE, POC: 144 mg/dl (ref 50–500)
GLUCOSE, POC: 126 mg/dl (ref 50–500)

## 2022-04-09 NOTE — Group Note (Addendum)
Group topic:  STRESS MANAGEMENT    Date of group:  04/09/2022  Start time of group:  1000  End time of group:  1100                 Summary of group discussion:Zentangle drawings  The counselor gave each patient blank paper to draw on and provided crowns for each table.  The patient were instructed that this is just a relaxation exercise to help focused them or to relieve excess energy.  She explained that the process is what counted not the finish product.    The counselor then asked each patient to draw a daughter in each corners of the paper.  She then asked them to draw a border around the edges of the paper by connecting the dots.    The counselor then asked them to draw lines or strings to divide up the square into sections.    The counselor then asked each patient to draw a different pattern, or shapes inside of each section inside the square.  The counselor explained that they can do this anyway they wanted to by dividing the paper into any sections that they wanted and they can use as many different shapes as they would like.    The counselor then asked them now that they have a shape inside of each section to go in and define each shape with different strokes, dashes, lungs or thoughts.  She did asked them to keep the border around the edge of the paper clear.  She explained that they should go around in feeling each's sections designs until they were completed.    The counselor then asked him to go in and provide differentiating or colors that they would like to add to their design.    At the end of the group she asked that he would like to share there is entangled drawings and discussed how this was useful for 6 different patients for different reasoning's.  Some people in the group stated it would help with her anxiety while others stated that it would help him with excess energy.  Someone else stated that it would actually help them focus.      Kathleen Munoz  is a 76 y.o. female participating in a  activity group.    Patient observations:  patient did attend group      Patient goals:  for the patient to attend groups      [name], MEd  04/09/2022, 11:38

## 2022-04-09 NOTE — Progress Notes (Signed)
[facility] [facility] PROGRESS NOTE      Kathleen Munoz, Kathleen Munoz  Date of Admission:  04/07/2022  Date of Birth:  Sep 14, 1945  Date of Service:  04/09/2022    Hospital Day:  LOS: 2 days     HPI:  I was asked see the patient because she states she did not feel well.  Yesterday she was wanting a COVID test.  When I spoke with her she does admit that she is feeling better today.  Her main physical complaints is back pain and headache.  Her back pain is chronic minor headache which has subsided is fairly new.  Her vital signs are stable.  She states she took a home COVID test and it was negative chest prior to coming here.  She does not have any other symptoms as far as Pulmonary, sore throat or fever.  She states she does not like to take Tylenol due to her "liver" and she does not like to take Motrin due to her being on Eliquis.  She is coloring with group therapy and again states she is doing better today.    Vital Signs:  Temp (24hrs) Max:36.7 C (98 F)      Temperature: 36.2 C (97.1 F)  BP (Non-Invasive): (!) 105/45  Heart Rate: 60  Respiratory Rate: 16  SpO2: 96 %    Current Medications:  acetaminophen (TYLENOL) tablet, 650 mg, Oral, Q4H PRN  albuterol 90 mcg per inhalation oral inhaler - "Nursing to administer", 2 Puff, Inhalation, Q6H PRN  aluminum-magnesium hydroxide-simethicone (MAG-AL PLUS) 200-200-20 mg per 5 mL oral liquid, 30 mL, Oral, Q4H PRN  apixaban (ELIQUIS) tablet, 5 mg, Oral, 2x/day  aspirin chewable tablet 81 mg, 81 mg, Oral, Daily  budesonide-glycopyr-formot (BREZTRI AEROSPHERE) 160 mcg-9 mcg-4.8 mcg per inhalation oral inhaler - "Respiratory to administer", 2 Puff, Inhalation, 2x/day  buPROPion [facility] SR) sustained release tablet, 150 mg, Oral, 2x/day  busPIRone (BUSPAR) tablet, 10 mg, Oral, 3x/day  citalopram (CeleXA) tablet, 40 mg, Oral, Daily  clonazePAM (klonoPIN) tablet, 1 mg, Oral, 3x/day  collagenase (SANTYL) 250 unit/gm ointment, , Apply Topically,  Daily  dextrose 50% (0.5 g/mL) injection - syringe, 25 g, Intravenous, Q15 Min PRN   Or  dextrose 50% (0.5 g/mL) injection - syringe, 12.5 g, Intravenous, Q15 Min PRN   Or  glucagon (GLUCAGEN DIAGNOSTIC KIT) injection 1 mg, 1 mg, Subcutaneous, Q15 Min PRN   Or  glucagon (GLUCAGEN DIAGNOSTIC KIT) injection 1 mg, 1 mg, IntraMUSCULAR, Q15 Min PRN  gabapentin (NEURONTIN) capsule, 400 mg, Oral, 3x/day  lisinopril (PRINIVIL) tablet, 20 mg, Oral, Daily   And  hydroCHLOROthiazide (HYDRODIURIL) tablet, 12.5 mg, Oral, Daily  ipratropium (ATROVENT) 0.03% nasal spray, 2 Spray, Nasal, 2x/day PRN  levothyroxine (SYNTHROID) tablet, 88 mcg, Oral, QAM  linagliptin (TRADJENTA) tablet, 5 mg, Oral, Daily  loratadine (CLARITIN) tablet, 10 mg, Oral, Daily  magnesium hydroxide (MILK OF MAGNESIA) 422m per 521moral liquid, 30 mL, Oral, HS PRN  metoprolol succinate (TOPROL-XL) 24 hr extended release tablet, 25 mg, Oral, Daily  multivitamin-iron-folic acid (CEROVITE ADVANCED FORMULA) tablet, 1 Tablet, Oral, Daily  pantoprazole (PROTONIX) delayed release tablet, 40 mg, Oral, Daily  SSIP insulin R human (HumuLIN R) 100 units/mL injection, 0-12 Units, Subcutaneous, 4x/day PRN  traZODone (DESYREL) tablet, 50 mg, Oral, HS PRN - MR x 1  traZODone (DESYREL) tablet, 200 mg, Oral, NIGHTLY        Current Orders:  Active Orders   Diet    DIET DIABETIC Calorie  amount: CC 2000; Do you want to initiate MNT Protocol? Yes     Frequency: All Meals     Number of Occurrences: 1 Occurrences   Nursing    APIXABAN NURSING ORDER     Frequency: UNTIL DISCONTINUED     Number of Occurrences: Until Specified     Order Comments: Nursing Instructions:    1.  Print apixaban (Eliquis) guide using the link in this order.   2.  Nurse to provide apixaban patient guide to all patients and be sure that all new starts have received education by the trained anticoagulation educator.  If additional questions, contact pharmacy.           BEHAVIORAL MED SAFETY CHECKS     Frequency:  Q15MIN     Number of Occurrences: 999 Occurrences    ENCOURAGE AMBULATION AND ROM EXERCISES     Frequency: UNTIL DISCONTINUED     Number of Occurrences: Until Specified    MISCELLANEOUS MD/DO TO NURSE     Frequency: UNTIL DISCONTINUED     Number of Occurrences: Until Specified     Order Comments: Please consult hospitalist to check Eliquis dose to make sure it is adequate      NOTIFY MD     Frequency: PRN     Number of Occurrences: Until Specified    PT IS LOW RISK FOR VENOUS THROMBOEMBOLISM     Frequency: CONTINUOUS     Number of Occurrences: Until Specified    VITAL SIGNS  EVERY 12 HRS (0800-2000)     Frequency: EVERY 12 HRS (0800-2000)     Number of Occurrences: Until Specified    WAS PATIENT ON APIXABAN PRIOR TO ADMISSION?     Frequency: UNTIL DISCONTINUED     Number of Occurrences: Until Specified   Code Status    FULL CODE     Frequency: CONTINUOUS     Number of Occurrences: Until Specified   Consult    IP CONSULT TO HOSPITALIST Requested Provider; Dionne Milo     Frequency: ONE TIME     Number of Occurrences: 1 Occurrences   Respiratory Care    OXYGEN - NASAL CANNULA     Frequency: CONTINUOUS     Number of Occurrences: Until Specified     Order Comments: Flowrate should not exeed 6L/min except [facility] facility.          Point of Care Testing    PERFORM POC WHOLE BLOOD GLUCOSE     Frequency: TID AC & HS     Number of Occurrences: Until Specified   Precaution    BMED SPECIAL PRECAUTIONS OBSERVE EVERY FIFTEEN MINUTES     Frequency: UNTIL DISCONTINUED     Number of Occurrences: Until Specified    LOW RISK SUICIDE PRECAUTIONS (PAVILION)     Frequency: UNTIL DISCONTINUED     Number of Occurrences: Until Specified   Medications    acetaminophen (TYLENOL) tablet     Frequency: Q4H PRN     Dose: 650 mg     Route: Oral    albuterol 90 mcg per inhalation oral inhaler - "Nursing to administer"     Frequency: Q6H PRN     Dose: 2 Puff     Route: Inhalation    aluminum-magnesium hydroxide-simethicone (MAG-AL PLUS)  200-200-20 mg per 5 mL oral liquid     Frequency: Q4H PRN     Dose: 30 mL     Route: Oral    apixaban (ELIQUIS) tablet  Frequency: 2x/day     Dose: 5 mg     Route: Oral    aspirin chewable tablet 81 mg     Frequency: Daily     Dose: 81 mg     Route: Oral    budesonide-glycopyr-formot (BREZTRI AEROSPHERE) 160 mcg-9 mcg-4.8 mcg per inhalation oral inhaler - "Respiratory to administer"     Frequency: 2x/day     Dose: 2 Puff     Route: Inhalation    buPROPion (WELLBUTRIN SR) sustained release tablet     Frequency: 2x/day     Dose: 150 mg     Route: Oral    busPIRone (BUSPAR) tablet     Frequency: 3x/day     Dose: 10 mg     Route: Oral    citalopram (CeleXA) tablet     Frequency: Daily     Dose: 40 mg     Route: Oral    clonazePAM (klonoPIN) tablet     Frequency: 3x/day     Dose: 1 mg     Route: Oral    collagenase (SANTYL) 250 unit/gm ointment     Frequency: Daily     Route: Apply Topically    dextrose 50% (0.5 g/mL) injection - syringe     Linked Order: Or     Frequency: Q15 Min PRN     Dose: 25 g     Route: Intravenous    dextrose 50% (0.5 g/mL) injection - syringe     Linked Order: Or     Frequency: Q15 Min PRN     Dose: 12.5 g     Route: Intravenous    gabapentin (NEURONTIN) capsule     Frequency: 3x/day     Dose: 400 mg     Route: Oral    glucagon (GLUCAGEN DIAGNOSTIC KIT) injection 1 mg     Linked Order: Or     Frequency: Q15 Min PRN     Dose: 1 mg     Route: Subcutaneous    glucagon (GLUCAGEN DIAGNOSTIC KIT) injection 1 mg     Linked Order: Or     Frequency: Q15 Min PRN     Dose: 1 mg     Route: IntraMUSCULAR    hydroCHLOROthiazide (HYDRODIURIL) tablet     Linked Order: And     Frequency: Daily     Dose: 12.5 mg     Route: Oral    ipratropium (ATROVENT) 0.03% nasal spray     Frequency: 2x/day PRN     Dose: 2 Spray     Route: Nasal    levothyroxine (SYNTHROID) tablet     Frequency: QAM     Dose: 88 mcg     Route: Oral    linagliptin (TRADJENTA) tablet     Frequency: Daily     Dose: 5 mg     Route: Oral     lisinopril (PRINIVIL) tablet     Linked Order: And     Frequency: Daily     Dose: 20 mg     Route: Oral    loratadine (CLARITIN) tablet     Frequency: Daily     Dose: 10 mg     Route: Oral    magnesium hydroxide (MILK OF MAGNESIA) 465m per 522moral liquid     Frequency: HS PRN     Dose: 30 mL     Route: Oral    metoprolol succinate (TOPROL-XL) 24 hr extended release tablet     Frequency: Daily  Dose: 25 mg     Route: Oral    multivitamin-iron-folic acid (CEROVITE ADVANCED FORMULA) tablet     Frequency: Daily     Dose: 1 Tablet     Route: Oral    pantoprazole (PROTONIX) delayed release tablet     Frequency: Daily     Dose: 40 mg     Route: Oral    SSIP insulin R human (HumuLIN R) 100 units/mL injection     Frequency: 4x/day PRN     Dose: 0-12 Units     Route: Subcutaneous    traZODone (DESYREL) tablet     Frequency: HS PRN - MR x 1     Dose: 50 mg     Route: Oral    traZODone (DESYREL) tablet     Frequency: NIGHTLY     Dose: 200 mg     Route: Oral        Review of Systems:  Focused review of system was completed. Refer to the HPI for ROS details.     Today's Physical Exam:    General:  Patient is pleasant cooperative.  HEENT: NC/AT   eyes  normal  Lungs:  Lungs clear, normal breathing, non-labored  CV: Regular rate and rhythm.   Abd:  non-tender, soft.  Extremities: no edema, normal appearance, pulses intact  Skin: no rashes, lesions,abscesses. Normal color.  Neuro: No focal deficits. Normal tone.         I/O:  I/O last 24 hours:  No intake or output data in the 24 hours ending 04/09/22 1108  I/O current shift:  No intake/output data recorded.    Labs  Please indicate ordered or reviewed)  Reviewed: Lab Results Today:    Results for orders placed or performed during the hospital encounter of 04/07/22 (from the past 24 hour(s))   POC BLOOD GLUCOSE (RESULTS)   Result Value Ref Range    GLUCOSE, POC 112 50 - 500 mg/dl   POC BLOOD GLUCOSE (RESULTS)   Result Value Ref Range    GLUCOSE, POC 139 50 - 500 mg/dl   POC  BLOOD GLUCOSE (RESULTS)   Result Value Ref Range    GLUCOSE, POC 120 50 - 500 mg/dl          Problem List:  Active Hospital Problems   (*Primary Problem)    Diagnosis    *Major depressive disorder       Assessment/ Plan:  We will continue to follow.  Blood sugars and vital signs stable.    Arline Asp, PA-C

## 2022-04-09 NOTE — Care Plan (Signed)
DENIES SI, HI. NO INJURIES NOTED. NO NEW ILLNESSES. AMBULATORY WITH A WALKER. ATTENDS GROUPS. TAKES MEDS. ENCOURAGE TO CONTINUE WITH GROUPS AND MEDS. ENCOURAGE COPING SKILLS.  GOAL IS TO GO HOME.  Problem: Adult Behavioral Health Plan of Care  Goal: Patient-Specific Goal (Individualization)  04/09/2022 1121 by Melanie Crazier, RN  Outcome: Ongoing (see interventions/notes)  04/09/2022 1120 by Melanie Crazier, RN  Outcome: Ongoing (see interventions/notes)  04/09/2022 1105 by Melanie Crazier, RN  Outcome: Ongoing (see interventions/notes)     Problem: Adult [place] of Care  Goal: Absence of New-Onset Illness or Injury  04/09/2022 1121 by Melanie Crazier, RN  Outcome: Ongoing (see interventions/notes)  04/09/2022 1120 by Melanie Crazier, RN  Outcome: Ongoing (see interventions/notes)  04/09/2022 1105 by Melanie Crazier, RN  Outcome: Ongoing (see interventions/notes)     Problem: Adult [place] of Care  Goal: Adheres to Safety Considerations for Self and Others  04/09/2022 1121 by Melanie Crazier, RN  Outcome: Ongoing (see interventions/notes)  04/09/2022 1120 by Melanie Crazier, RN  Outcome: Ongoing (see interventions/notes)  04/09/2022 1105 by Melanie Crazier, RN  Outcome: Ongoing (see interventions/notes)     Problem: Adult Treatment Plan  Goal: Patient will meet daily with attending physician.  Description: Physicians will evaluate patient, discuss diagnosis, prescribe medications and order diagnostic testing as clinically indicated. Physicians will monitor safety risks and assess when appropriate for discharge.  04/09/2022 1121 by Melanie Crazier, RN  Outcome: Ongoing (see interventions/notes)  04/09/2022 1120 by Melanie Crazier, RN  Outcome: Ongoing (see interventions/notes)  04/09/2022 1105 by Melanie Crazier, RN  Outcome: Ongoing (see interventions/notes)     Problem: Adult Treatment Plan  Goal: Patient will participate in 75% of morning  affirmation and psychoeducational groups provided daily by Mental Health Specialist.  04/09/2022 1121 by Melanie Crazier, RN  Outcome: Ongoing (see interventions/notes)  04/09/2022 1120 by Melanie Crazier, RN  Outcome: Ongoing (see interventions/notes)     Problem: Adult [place] of Care  Goal: Plan of Care Review  04/09/2022 1121 by Melanie Crazier, RN  Outcome: Ongoing (see interventions/notes)  04/09/2022 1120 by Melanie Crazier, RN  Outcome: Ongoing (see interventions/notes)  04/09/2022 1105 by Melanie Crazier, RN  Outcome: Ongoing (see interventions/notes)

## 2022-04-09 NOTE — Progress Notes (Signed)
[facility]  Inpatient Progress Note  Kathleen Munoz, Kathleen Munoz  Date of Admission:  04/07/2022  Date of Birth:  30-Mar-1946  Date of Service:  04/09/2022    Ms. Casserly seen today in follow up of continuing psychiatric symptoms.  Notes and labs reviewed.  Case discussed with treatment team and hospitalist.  Today the patient states that she feels a little better.  She is focused on back pain from the bed, she was also inpatient with nursing staff and medication delivery last evening, she has low tolerance for things not going her way.  She is talking to [place] staff today to consider SOP program on outpatient basis.  She still contends that she is not a morning person.  She did eat breakfast this morning.  No violence or agitated behavior.  She has tolerated recent medication changes.  She would like to keep her stay brief as possible so that she can get back home.  She denies feeling suicidal or homicidal.  She is occasionally irritable and sad.  Takes meds.  No reported side effects.   No reports of problem behaviors or violence.  No prn meds required.  No physical complaints on review of systems.    Alert and oriented x4.  Casual dress, calm, well groomed.  No SI/HI/AVH, delusions, or paranoia.  Thoughts are logical, coherent, goal directed.  Good eye contact. Speech is normal rate and tone.  Mood is ``ok affect congruent.  No psychomotor agitation or psychomotor retardation, no cogwheel rigidity or abnormal movements.  Gait is normal.  Attention is good.  Concentration and memory good.  No cognitive deficits noted.  Judgment fair, insight fair.  Calculation and abstraction are within normal limits.  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  I encouraged groups and working on coping skills.  To work with counselors as indicated.    I will continue medications adjusted yesterday.  I have discussed her case with the SOP program as well as revisited that topic with her today.  I think that would be a good step-down program for her.  We will look at discharge around Friday or Saturday if she continues to improve.  Continue support, safety, groups, observation and allow time for further improvement.  I certify that this psychiatric inpatient admission is medically necessary for treatment which can reasonably be expected to improve the patients condition.  Buford Dresser, MD

## 2022-04-09 NOTE — Nurses Notes (Signed)
Resting quietly in chair in room. Alert & oriented. Cooperative w/assessment & meds. Rates anxiety and depression 7 on 1-10 scale. Denies any SI/HI, A/V hallucinations, sleep or appetite suppression. Santyl & BA applied to rt outer ankle wound. No acute distress noted. Visual safety checks q64min & prn.

## 2022-04-09 NOTE — Group Note (Signed)
Group topic:  PROCESS THERAPY    Date of group:  04/09/2022  Start time of group:  1430  End time of group:  1530               Summary of group discussion: ways to handle strong negative emotions  Discussed how to identify the strong negative emotions we experience so we can understand them, the impact they have on [place] and how to better process them and communicate with others about them.   Coping--safe and healthy things to try   1. slow, deep breathing    2. ask for help    3. talk to someone you trust    4. play a game    5. stretch/yoga    6. counting    7. watch something funny    8. take a shower or a bath    9. lie down and relax; take a nap    10. try a relaxation technique--tighten each muscle then relax it--start at your face and move down the body slowly    11. play with a pet    12. draw, color, paint    13. scream into a pillow    14. listen to music that makes you feel good, feel happy    15. go for a walk    16. write in a journal    17. work outside (in your garden, on a project, pull weeds, etc).    18. drink some cold water    19. rip up old newspapers or magazines    20. read    21. think happy thoughts, think of happy memories    22. help someone else do something    23. squeeze a stress ball    24. dance    25. cook    26. look at pictures of things you love    93. splash cold water on your face    28. watch a movie    Discussed ways/things that can trigger [place], feed those strong negative emotions and how to avoid that.       Kathleen Munoz  is a 76 y.o. female participating in a process group.    Affect/Mood:  Appropriate    Thought Process:  Logical    Thought Content:  Within normal limits    Interpersonal:  Discussed issues    Level of participation:  Full        Lendon Colonel, MS  04/09/2022, 15:39

## 2022-04-09 NOTE — Behavioral Health (Signed)
Counselor met with patient. Patient's mood and affect much improved. Patient is agreeable to SOP. Counselor advised patient she will receive the outpatient director's card and is to call on Monday to schedule her intake. Counselor advised after her intake she will begin in the program. Patient is agreeable to this plan and asked when she gets to go home. Counselor advised the attending has not confirmed anything yet with the counselor. Patient states "well I think he will let me go on Friday as this is what we discussed."

## 2022-04-10 DIAGNOSIS — Z634 Disappearance and death of family member: Secondary | ICD-10-CM

## 2022-04-10 DIAGNOSIS — F411 Generalized anxiety disorder: Secondary | ICD-10-CM

## 2022-04-10 DIAGNOSIS — F332 Major depressive disorder, recurrent severe without psychotic features: Principal | ICD-10-CM

## 2022-04-10 LAB — URINE CULTURE,ROUTINE: URINE CULTURE: 10000 — AB

## 2022-04-10 LAB — POC BLOOD GLUCOSE (RESULTS): GLUCOSE, POC: 115 mg/dl (ref 50–500)

## 2022-04-10 NOTE — Behavioral Health (Signed)
Patient reports her youngest daughter will come pick her up tomorrow after she gets off work--so it will be after 5pm most likely. Patient will call [facility] outpatient director on Monday, 04/14/22, to schedule her intake for SOP and begin participating in the program. Upon completion of the SOP program, her follow-up will be set with her established provider at [facility]. The patient denies all complaints today and is interacting with her peers in the dayroom.

## 2022-04-10 NOTE — Group Note (Signed)
Psychoeducational Group Note  Date of group:  04/10/2022  Start time of group:  1000  End time of group:  1100               Summary of group discussion:  The counselor had the patient's take an anxiety quiz to rate their anxiety. They then talked about anxiety and went over this hand out.    Here are 11 tips for coping with an anxiety disorder:  Keep physically active.  Develop a routine so that you're physically active most days of the week. Exercise is a powerful stress reducer. It can improve your mood and help you stay healthy. Start out slowly, and gradually increase the amount and intensity of your activities.  Avoid alcohol and recreational drugs.  These substances can cause or worsen anxiety. If you can't quit on your own, see your health care provider or find a support group to help you.  Quit smoking, and cut back or quit drinking caffeinated beverages.  Nicotine and caffeine can worsen anxiety.  Use stress management and relaxation techniques.  Visualization techniques, meditation and yoga are examples of relaxation techniques that can ease anxiety.  Make sleep a priority.  Do what you can to make sure you're getting enough sleep to feel rested. If you aren't sleeping well, talk with your health care provider.  Eat healthy foods.  A healthy diet that incorporates vegetables, fruits, whole grains and fish may be linked to reduced anxiety, but more research is needed.  Learn about your disorder.  Talk to your health care provider to find out what might be causing your specific condition and what treatments might be best for you. Involve your family and friends, and ask for their support.  Stick to your treatment plan.  Take medications as directed. Keep therapy appointments and complete any assignments your therapist gives. Consistency can make a big difference, especially when it comes to taking your medication.  Identify triggers.  Learn what situations or actions cause you stress or increase your  anxiety. Practice the strategies you developed with your mental health provider so you're ready to deal with anxious feelings in these situations.  Keep a journal.  Keeping track of your personal life can help you and your mental health provider identify what's causing you stress and what seems to help you feel better.  Socialize.  Don't let worries isolate you from loved ones or activities.  Your worries may not go away on their own, and they may worsen over time if you don't seek help. See your health care provider or a mental health provider before your anxiety worsens. It's easier to treat if you get help early.      Kathleen Munoz  s a 76 y.o. female participating in a psychoeducational group.    Affect/Mood:  Appropriate    Thought Process:  Logical    Thought Content:  Within normal limits    Interpersonal:  Discussed issues    Level of participation:  Full    Comments:     Kathleen Munoz, Kathleen Munoz 04/10/2022 12:18

## 2022-04-10 NOTE — Progress Notes (Signed)
[facility]  Inpatient Progress Note  Adean, Milosevic  Date of Admission:  04/07/2022  Date of Birth:  26-Dec-1945  Date of Service:  04/10/2022    Ms. Macrae seen today in follow up of continuing psychiatric symptoms.  Notes and labs reviewed.  Case discussed with treatment team and hospitalist.  Today the patient states that she feels "better".  She has agreed to attend the SOP program and we will plan for discharge tomorrow.  She is in agreement with this.  She has receive support from her daughters which makes her feel better as well.  She is active on the unit and up before groups.  She states that she felt a little tired this morning but slept well last evening.  She is tolerating medications.  She still contends that she is not a morning person.  She did eat breakfast this morning.  No violence or agitated behavior.  She has tolerated recent medication changes.  She would like to keep her stay brief as possible so that she can get back home.  She denies feeling suicidal or homicidal.  She is occasionally irritable and sad.  Takes meds.  No reported side effects.   No reports of problem behaviors or violence.  No prn meds required.  No physical complaints on review of systems.    Alert and oriented x4.  Casual dress, calm, well groomed.  No SI/HI/AVH, delusions, or paranoia.  Thoughts are logical, coherent, goal directed.  Good eye contact. Speech is normal rate and tone.  Mood is ``ok affect congruent.  No psychomotor agitation or psychomotor retardation, no cogwheel rigidity or abnormal movements.  Gait is normal.  Attention is good.  Concentration and memory good.  No cognitive deficits noted.  Judgment fair, insight fair.  Calculation and abstraction are within normal limits.  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  I encouraged groups and working on coping skills.  To work with counselors as indicated.    I will continue medications recently adjusted.  Plan for discharge tomorrow with SOP follow-up on Monday.  I think that would be a good step-down program for her.  We will look at discharge around Friday or Saturday if she continues to improve.  Continue support, safety, groups, observation and allow time for further improvement.  I certify that this psychiatric inpatient admission is medically necessary for treatment which can reasonably be expected to improve the patients condition.  Buford Dresser, MD

## 2022-04-10 NOTE — Nurses Notes (Signed)
7P-7A  ALERT AND ORIENTED TO PERSON/PLACE/TIME.  FLAT AFFECT.  COOPERATIVE.  UP AD LIB. GAIT STEADY WITH WALKER IN USE. DENIES ANXIETY/DEPRESSION, A/V/T HALLUCINATIONS.  DENIES SI/HI.  STATES APPETITE IS GOOD.  STATES She HAS BEEN SLEEPING GOOD.  VISUAL SAFETY CHECKS Q15MIN AND PRN BY STAFF.

## 2022-04-10 NOTE — Care Plan (Signed)
PATIENT IS PLEASANT AND COOPERATIVE. OBSERVED IN THE DAY ROOM INTERACTING WITH OTHER PATIENTS. DISCHARGE PLANNED FOR TOMORROW  Problem: Adult Behavioral Health Plan of Care  Goal: Plan of Care Review  Outcome: Ongoing (see interventions/notes)  Goal: Patient-Specific Goal (Individualization)  Outcome: Ongoing (see interventions/notes)  Goal: Strengths and Vulnerabilities  Outcome: Ongoing (see interventions/notes)  Goal: Adheres to Safety Considerations for Self and Others  Outcome: Ongoing (see interventions/notes)  Goal: Absence of New-Onset Illness or Injury  Outcome: Ongoing (see interventions/notes)  Goal: Optimized Coping Skills in Response to Life Stressors  Outcome: Ongoing (see interventions/notes)  Goal: Develops/Participates in Therapeutic Alliance to Support Successful Transition  Outcome: Ongoing (see interventions/notes)  Goal: Rounds/Family Conference  Outcome: Ongoing (see interventions/notes)

## 2022-04-10 NOTE — Nurses Notes (Signed)
Reports sleeping well last night and good appetite. Denies SI/HI/AVH. Pleasant and cooperative. Medication compliant. Wound care done to outer ankle area and new dressing applied. Tolerated well. Rates depression at 7/10. Denies anxiety. Will continue to monitor with q 15 minute rounds per protocol

## 2022-04-11 LAB — POC BLOOD GLUCOSE (RESULTS): GLUCOSE, POC: 159 mg/dl (ref 50–500)

## 2022-04-11 MED ORDER — LISINOPRIL 20 MG TABLET
20.0000 mg | ORAL_TABLET | Freq: Every day | ORAL | 0 refills | Status: DC
Start: 2022-04-12 — End: 2022-07-02

## 2022-04-11 MED ORDER — HYDROCHLOROTHIAZIDE 25 MG TABLET
12.5000 mg | ORAL_TABLET | Freq: Every day | ORAL | 0 refills | Status: DC
Start: 2022-04-12 — End: 2022-07-02

## 2022-04-11 MED ORDER — CITALOPRAM 40 MG TABLET
40.0000 mg | ORAL_TABLET | Freq: Every day | ORAL | 0 refills | Status: DC
Start: 2022-04-12 — End: 2023-06-18

## 2022-04-11 MED ORDER — APIXABAN 5 MG TABLET
5.0000 mg | ORAL_TABLET | Freq: Two times a day (BID) | ORAL | 0 refills | Status: DC
Start: 2022-04-11 — End: 2022-07-04

## 2022-04-11 MED ORDER — METOPROLOL SUCCINATE ER 25 MG TABLET,EXTENDED RELEASE 24 HR
25.0000 mg | ORAL_TABLET | Freq: Every day | ORAL | 0 refills | Status: DC
Start: 2022-04-12 — End: 2023-06-18

## 2022-04-11 MED ORDER — BUSPIRONE 10 MG TABLET
10.0000 mg | ORAL_TABLET | Freq: Three times a day (TID) | ORAL | 0 refills | Status: AC
Start: 2022-04-11 — End: ?

## 2022-04-11 MED ORDER — BUPROPION HCL SR 150 MG TABLET,12 HR SUSTAINED-RELEASE
150.0000 mg | ORAL_TABLET | Freq: Two times a day (BID) | ORAL | 0 refills | Status: AC
Start: 2022-04-11 — End: ?

## 2022-04-11 NOTE — Group Note (Signed)
Group topic:  OPEN GROUP DISCUSSION    Date of group:  04/11/2022  Start time of group:  1000  End time of group:  1100                 Summary of group discussion:  The counselor had we discussed things such as coping skills and how to identify emotions going in and how to process them out.      Kathleen Munoz  is a 76 y.o. female participating in a activity group.    Patient observations:  patient attend group       Patient goals: for patient to continue to attend groups      [name], MEd  04/11/2022, 11:42

## 2022-04-11 NOTE — Discharge Summary (Signed)
[facility]   DISCHARGE SUMMARY        PATIENT NAME:  Kathleen Munoz, Kathleen Munoz   MRN:  [id number]  DOB:  27-Oct-1945    ENCOUNTER DATE:  04/07/2022  INPATIENT ADMISSION DATE: 04/07/2022    DISCHARGE DATE: 04/11/2022    ADMITTING PHYSICIAN: Diona Browner  PRIMARY CARE PHYSICIAN: Madolyn Frieze, MD     Reason for Admission       Diagnosis        Major depressive disorder [76615]          DISCHARGE DIAGNOSIS: Major depressive disorder         Hospital Problems    depressive disorder         Date Noted: 04/07/2022      Resolved Hospital Problems  No resolved problems to display.      Chronic  Problems    Pulmonary embolism (CMS [facility])         Date Noted: 11/17/2021      Cellulitis and abscess of right lower extremity         Date Noted: 11/17/2021      Sleep apnea      Diabetes mellitus, type 2 (CMS HCC)      Allergies   Allergen Reactions    Influenza Virus Vaccines                                                                                              DISCHARGE MEDICATIONS:     Current Discharge Medication List        START taking these medications.        Details   citalopram 40 mg Tablet  Commonly known as: CeleXA  Start taking on: April 12, 2022   40 mg, Oral, DAILY  Qty: 30 Tablet  Refills: 0     hydroCHLOROthiazide 25 mg Tablet  Commonly known as: HYDRODIURIL  Start taking on: April 12, 2022   12.5 mg, Oral, DAILY  Qty: 30 Tablet  Refills: 0            CONTINUE these medications which have CHANGED during your visit.        Details   buPROPion 150 mg tablet sustained-release 12 hr  Commonly known as: WELLBUTRIN SR  What changed:   medication strength  how much to take  when to take this   150 mg, Oral, 2 TIMES DAILY WITH BREAKFAST & LUNCH  Qty: 60 Tablet  Refills: 0     busPIRone 10 mg Tablet  Commonly known as: BUSPAR  What changed: when to take this   10 mg, Oral, 3 TIMES DAILY  Qty: 90 Tablet  Refills: 0            CONTINUE these medications - NO CHANGES were made during your visit.         Details   albuterol sulfate 90 mcg/actuation oral inhaler  Commonly known as: PROVENTIL or VENTOLIN or PROAIR   1-2 Puffs, Inhalation, EVERY 6 HOURS PRN  Refills: 0     apixaban 5 mg Tablet  Commonly known as: ELIQUIS  5 mg, Oral, 2 TIMES DAILY  Qty: 60 Tablet  Refills: 0     aspirin 81 mg Tablet, Chewable   81 mg, Oral, DAILY  Refills: 0     Breztri Aerosphere 160-9-4.8 mcg/actuation HFA Aerosol Inhaler  Generic drug: budesonide-glycopyr-formoterol   2 Puffs, Inhalation, 2 TIMES DAILY  Refills: 0     clonazePAM 1 mg Tablet  Commonly known as: klonoPIN   1 mg, Oral, 3 TIMES DAILY  Refills: 0     collagenase ointment 250 unit/gram Ointment  Commonly known as: SANTYL   Apply Topically, DAILY  Refills: 0     fexofenadine 180 mg Tablet  Commonly known as: ALLEGRA   180 mg, Oral, DAILY  Refills: 0     gabapentin 300 mg Capsule  Commonly known as: NEURONTIN   400 mg, Oral, 3 TIMES DAILY  Refills: 0     ipratropium bromide 21 mcg (0.03 %) nasal spray  Commonly known as: ATROVENT   2 Sprays, Nasal, 2 TIMES DAILY PRN  Qty: 30 mL  Refills: 12     levothyroxine 88 mcg Tablet  Commonly known as: SYNTHROID   88 mcg, Oral, EVERY MORNING  Refills: 0     lisinopriL 20 mg Tablet  Commonly known as: PRINIVIL  Start taking on: April 12, 2022   20 mg, Oral, DAILY  Qty: 30 Tablet  Refills: 0     metoprolol succinate 25 mg Tablet Sustained Release 24 hr  Commonly known as: TOPROL-XL  Start taking on: April 12, 2022   25 mg, Oral, DAILY  Qty: 30 Tablet  Refills: 0     multivitamin Capsule   1 Capsule, Oral, DAILY  Refills: 0     omeprazole 40 mg Capsule, Delayed Release(E.C.)  Commonly known as: PRILOSEC   40 mg, Oral, 2 TIMES DAILY  Refills: 0     SITagliptin phosphate 100 mg Tablet  Commonly known as: JANUVIA   Oral, DAILY  Refills: 0     traZODone 150 mg Tablet  Commonly known as: DESYREL   200 mg, Oral, NIGHTLY  Refills: 0            STOP taking these medications.      lisinopriL-hydrochlorothiazide 20-12.5 mg  Tablet  Commonly known as: ZESTORETIC              DISCHARGE INSTRUCTIONS:   No discharge procedures on file.      REASON FOR HOSPITALIZATION: SI, sadness    HOSPITAL COURSE:    Ms. Aslin is a 76 year old white female with a 20+ year history of depression admitted on a voluntary basis we [facility] where she presented with depression, suicidal ideation, tearfulness, sadness, and bereavement.  Patient's husband died about 2 years ago since that time she has had difficult time with escalating symptoms in the past few weeks.  She has had some suicidal thoughts with no intent.  She does have a gun at home.  Family has been very supportive and been worried about the patient.  They have encouraged her to seek inpatient treatment.  The patient is not use drugs or alcohol.  She has seen Dr. Reginold Agent her house for many years.  She is taking medications but has had continuing depressive symptomatology.  Complicating factors include recent medical issues including pulmonary embolism as well as a burn on her right ankle from a heating pad.  That has been slow to heal.  Patient was evaluated in deemed medically stable.  Drug screen was  negative.  Labs were unremarkable.  Patient's biggest symptoms include feeling tearful, lonely, sad, hopeless, helpless, and having no energy.  She has very few hobbies and is lonely at home as she lives in the home where her husband resided.  She denies any recent illnesses, injuries, head traumas.     Patient was admitted to the hospital for stabilization of admission symptoms.  Medicines were instituted and adjusted.  During the stay, the patient did well with medication adjustments and tolerated meds well.  The patient was enrolled in groups and milieu treatment.  We worked on Radiographer, therapeutic, stress management techniques, and the need for compliance, sobriety, and taking medications appropriately.  Medication side effects were discussed as well.  During the stay, the patient had no  violence, agitation, or threatening behavior and required no p.r.n. medications, seclusion, or restraint.  The patient has improved significantly over the course of their stay, and is appropriate for step down to a less restrictive level of care.  The patient agrees with the plan and at the time of departure is not suicidal, homicidal, or psychotic.  I feel that the patient has maximized benefit from inpatient treatment.  Patient provided crisis numbers to call should symptoms worsen.  Condition at discharge:  Patient is stable and improved.  They are not suicidal, homicidal, or psychotic.  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  She has a history of pulmonary embolism, cellulitis, increased LFTs, diabetes mellitus type 2, sleep apnea, arthritis, incontinence, tubal ligation, left hand surgery, she has a burn on her right foot that has been slow to heal for eating pad injury.  She has no history of seizures, loss of conscious, or head trauma.  Axis 4: Severe psychosocial stressors  Axis 5: Admission GAF:  20   , best in past year:  Unknown  Plan:  Patient will follow-up with outpatient provider as scheduled  Time taken on the day of discharge exceeded 30 minutes and included med reconciliation, treatment team staffing, discussion with patient, review of the record, and completion of all documentation.

## 2022-04-11 NOTE — Nurses Notes (Signed)
PATIENT IS PLEASANT AND COOPERATIVE. DENIES SI, HI, AND HALLUCINATIONS. REVIEWED DISCHARGE INSTRUCTIONS WITH PATIENT. PATIENT VOICES UNDERSTANDING OF DISCHARGE INSTRUCTIONS. PATIENT PICKED UP BY DAUGHTER. Rxs WERE SENT ELECTRONICALLY.

## 2022-04-11 NOTE — Discharge Instructions (Signed)
Reason for Admission: PROFOUND DEPRESSION    LAB TESTING      Lab Results   Component Value Date    HA1C 6.2 (H) 04/08/2022     No results found for: "GLUCOSEFAST"  Lab Results   Component Value Date    CHOLESTEROL 167 04/08/2022    LDLCHOL 99 04/08/2022    TRIG 143 04/08/2022    TRIG 143 04/08/2022     Lab Results   Component Value Date/Time    ALBUMIN 3.8 04/07/2022 06:26 PM    TOTALPROTEIN 8.0 04/07/2022 06:26 PM    ALKPHOS 74 04/07/2022 06:26 PM    PROTHROMTME 12.4 11/17/2021 11:56 AM    INR 1.07 11/17/2021 11:56 AM      Lab Results   Component Value Date/Time    AST 21 04/07/2022 06:26 PM    ALT 26 04/07/2022 06:26 PM     Lab Results   Component Value Date/Time    WBC 10.3 04/07/2022 06:26 PM    HGB 12.5 04/07/2022 06:26 PM    HCT 36.6 (L) 04/07/2022 06:26 PM    PLTCNT 312 04/07/2022 06:26 PM     Lab Results   Component Value Date    COVID19PCR Not Detected 11/17/2021     Lab Results   Component Value Date/Time    BUNCRRATIO 18 04/07/2022 06:26 PM      Pended studies: None

## 2022-04-11 NOTE — Nurses Notes (Signed)
7P-7A nursing shift note. Patient dressed in appropriate attire, Patient alert and oriented x4, patient rates anxiety 7/10 and depression 7/10. Pt denies SI, HI, and A/V/T hallucinations at this time. Pt states they are eating WELL and sleeping WELL at this time. Will continue to visually monitor Q 15 minute via staff.

## 2022-04-16 ENCOUNTER — Encounter (HOSPITAL_PSYCHIATRIC): Payer: Self-pay

## 2022-04-16 ENCOUNTER — Ambulatory Visit: Payer: Medicare Other | Attending: Psychiatry

## 2022-04-16 DIAGNOSIS — F332 Major depressive disorder, recurrent severe without psychotic features: Secondary | ICD-10-CM

## 2022-04-16 DIAGNOSIS — F329 Major depressive disorder, single episode, unspecified: Secondary | ICD-10-CM

## 2022-04-16 DIAGNOSIS — F339 Major depressive disorder, recurrent, unspecified: Secondary | ICD-10-CM | POA: Insufficient documentation

## 2022-04-16 NOTE — Group Note (Signed)
BEHAVIORAL MEDICINE, THE [facility] [place]  [address]  [place] [address]  Operated by [facility]  Group Note             Name: Kathleen Munoz   Date of Birth: Jun 16, 1946   Today's Date: 04/16/2022   Group Start Time:  9:30 AM   Group End Time: 10:25 AM   Group Topic: Intensive Outpatient Program  Number of participants: 4      Summary of group discussion:   Group discussed a recent situation (+/-)  and how they managed it. Various coping strategies were given.  Each was an appropriate way to address the specific situation presented.         All group members shared a challenging situation.  All were active participants.  All were supportive of one another.       Moranda's Participation and Response: Today is [name]'s first day of group.  She is friendly, well groomed and pleasant.  The group warmly welcomes her and she responds warmly back.  She has recently been discharged from inpatient care.  She shared a recent situation that included reaching out to someone she trusts and asking for help. Group shares the importance of reaching out to others in time of need.      Suicidal/Homicidal Risk:  Currently denies SI/HI and expresses willingness to contact crisis services if needed.

## 2022-04-17 ENCOUNTER — Other Ambulatory Visit (HOSPITAL_PSYCHIATRIC): Payer: Self-pay | Admitting: Psychiatry

## 2022-04-17 DIAGNOSIS — F332 Major depressive disorder, recurrent severe without psychotic features: Secondary | ICD-10-CM

## 2022-04-18 NOTE — Group Note (Signed)
BEHAVIORAL MEDICINE, THE [facility] OF THE [place]  [address]  [place] [address]  Operated by [facility]  Group Note             Name: Kathleen Munoz   Date of Birth: 11-Aug-1945   Today's Date: 04/16/2022   Group Start Time: 10:30 AM   Group End Time: 11:28 AM   Group Topic: Intensive Outpatient Program  Number of participants: 4    Summary of group discussion:   Group focus was on nutrition.  Discussed the importance of taking medication as prescribed and if it was recommended with food or without. Discussed how food choices, the time eaten and portions contribute to sleep.        All group members shared in the discussion and gave tips and suggestions to each other on ways to improve nutrition.      Kathleen Munoz's Participation and Response: [name] lives alone and does not cook often.  She keeps snacks in the house because her 2  grandchildren come after school to her home 2-3 evenings.  She has diabetes and is aware of the importance her nutrition contributes to it.       Suicidal/Homicidal Risk:  Currently denies SI/HI and expresses willingness to contact crisis services if needed.

## 2022-04-18 NOTE — Group Note (Signed)
BEHAVIORAL MEDICINE, THE [facility]IORAL [facility]ALTH P[facility]IL[facility] OF THE [place]  [address] [place]  [place] [address]  Operated by [facility]  Group Note             Name: Kathleen Munoz   Date of Birth: [birth year]   Today's Date: 04/16/2022   Group Start Time: 11:30 AM   Group End Time: 12:30 PM   Group Topic: Intensive Outpatient Program  Number of participants: 4    Summary of group discussion:   The different ways people grieve were discussed.   Loss of significant loved ones was shared. Several members shared how  their loved one passed.       [name]'s Participation and Response:  [name]'s spouse of 50+ years passed about 3 years ago.  She shared the numerous changes and challenges she has faced since living alone.      Suicidal/Homicidal Risk:  Currently denies SI/HI and expresses willingness to contact crisis services if needed.

## 2022-04-21 ENCOUNTER — Ambulatory Visit: Payer: Medicare Other | Attending: Psychiatry

## 2022-04-21 DIAGNOSIS — F332 Major depressive disorder, recurrent severe without psychotic features: Secondary | ICD-10-CM | POA: Insufficient documentation

## 2022-04-21 NOTE — Group Note (Signed)
BEHAVIORAL MEDICINE, THE [facility] OF THE [place]  [address]  [place] [address]  Operated by [facility]  Group Note             Name: Kathleen Munoz   Date of Birth: 11/10/1945   Today's Date: 04/21/2022   Group Start Time: 11:30 AM   Group End Time: 12:28 PM   Group Topic: Intensive Outpatient Program  Number of participants: 5      Summary of group discussion:   Group focus was on styles of "Communicating Your Needs".  Assertion, aggression and non assertion/passive was discussed and several examples were given for each.         Group shared different situations with different people that they have been a part of or witnessed   these responses.  They also shared how they have reduced contact with those with unhealthy styles of communication, what that was like for them and styles of self and others now.        Using a 4 part approach to communicating one's needs using "I statements" was discussed and real situations that this can be applied to now.       This included:  providing non judgmental descriptions of the situation, feelings about the situation, effects of the situation and what they would prefer take place.       Christal's Participation and Response: [name] is interested in addressing some concerns using the "I Message" technique.  Her pain levels have increased throughout group.  She is very slowed to make it to her vehicle and takes several breaks before getting there.       Suicidal/Homicidal Risk:  Currently denies SI/HI and expresses willingness to contact crisis services if needed.

## 2022-04-21 NOTE — Group Note (Signed)
BEHAVIORAL MEDICINE, THE BE[facility]IORAL HEALTH PAVILION OF THE [place]  [address] [place]  [place] [address]  Operated by [facility]  Group Note             Name: ELOWYN RAUPP   Date of Birth: 1946-06-29   Today's Date: 04/21/2022   Group Start Time: 10:30 AM   Group End Time: 11:29 AM   Group Topic: Intensive Outpatient Program  Number of participants: 5      Summary of group discussion:   Group focus was on identifying physical and thinking responses to anxiety, stress, and worries.  Two check list of potential responses were given to each members.  They identified those that they experience within a given situation.  Most identified a cluster of symptoms that occur at the same time.  Most experience Panic Attacks/ Anxiety Attacks.         They ranked each symptoms on a scale of 1-5 with 5 being a severe response.  Scores were totaled.  Each member was giving an opportunity to described their symptoms and also to identify which they experienced more intensity:  the physical, the emotional or  very have similar intensity levels for both.      Lettie's Participation and Response: [name] shared she has unwanted stress, and worrisome thoughts in the evenings around 7-9 pm. She does not experience anxiety or panic attacks.  She is going to contact her podiatrist when they are in the office tomorrow and asked to be seen prior to her scheduled appointment.  She has had a sore (burn) on her right ankle for about  months.  The doctor prescribed a cream.  The cream is not helping and the ankle is painful.  She is diabetic.  It is hard to walk.        Suicidal/Homicidal Risk:  Currently denies SI/HI and expresses willingness to contact crisis services if needed.

## 2022-04-21 NOTE — Group Note (Signed)
BEHAVIORAL MEDICINE, THE [facility] P[facility] OF THE [place]  [place] [place]  [place] [address]  Operated by [facility]  Group Note             Name: Kathleen Munoz   Date of Birth: 21-Aug-1945   Today's Date: 04/21/2022   Group Start Time:  9:30 AM   Group End Time: 10:25 AM   Group Topic: Intensive Outpatient Program  Number of participants: 5      Summary of group discussion:   Group shared recent events and how they impact depression/anxiety.  They all identified at least one coping skill they used in helping to manage the situation.         All were alert and active participants.  All were supportive of other group members.       [name]'s Participation and Response: [name] is using a walker and portable oxygen (2L).  She described a fall she experienced this morning leaving her house to come to group.  She fell on  her buttock and struggled to get up.  She reports being sore and she is slower than she was last week.  She has considered a home alert bracelet or necklace.      Suicidal/Homicidal Risk:  Currently denies SI/HI and expresses willingness to contact crisis services if needed.

## 2022-04-23 ENCOUNTER — Ambulatory Visit: Payer: Medicare Other | Attending: Psychiatry

## 2022-04-23 DIAGNOSIS — F332 Major depressive disorder, recurrent severe without psychotic features: Secondary | ICD-10-CM

## 2022-04-23 NOTE — Group Note (Signed)
BEHAVIORAL MEDICINE, THE BE[facility]IORAL [facility]ALTH PAVILION OF THE [place]  [place] [address]  Operated by [facility]  Group Note             Name: Kathleen Munoz   Date of Birth: [birth year]   Today's Date: 04/23/2022   Group Start Time: 11:30 AM   Group End Time: 12:28 PM   Group Topic: Intensive Outpatient Program  Number of participants: 4      Summary of group discussion:   Group focus was on activities to do when stressed, anxious, depressed in order to encourage change of thoughts and activities.         Group members identified at least 37.  This is a list they will keep available and review on days when they are feeling down, discouraged, lack energy or desire, or are stuck on unwanted/negative thoughts.       Group members shared several that have been helpful to them.  They gathered new potential activities from one another.      [name]'s Participation and Response: [name] is appreciative of her family (2 daughters, son-in-laws, and grandchildren) that have contact with her daily (phone), include her in going to church, help get groceries once a week and when she is able to go out with them.  Currently, she is struggling to get out of bed and get to group.  She is very slow to get out of her seat (pain and unsteady) and struggles to walk to the restroom.  The undersigned walked beside her to her vehicle when group discharged.  She takes frequent breaks due to breathing and exertion.  It is challenging for her to get in the seat of her CRV.... she struggles to get her leg in the vehicle. It very obvious she is in pain. She is thankful for the assistance. (Ex:  I put her walker in her trunk... "Oh, thank you so much. That is so helpful." She said.      Suicidal/Homicidal Risk:  Currently denies SI/HI and expresses willingness to contact crisis services if needed.

## 2022-04-23 NOTE — Group Note (Signed)
BEHAVIORAL MEDICINE, [facility] [place]  [place]  [place] [address]  Operated by [facility]  Group Note             Name: Kathleen Munoz   Date of Birth: Nov 18, 1945   Today's Date: 04/23/2022   Group Start Time:  9:30 AM   Group End Time: 10:28 AM   Group Topic: Intensive Outpatient Program  Number of participants: 4      Summary of group discussion:   Group focus was on reviewing signs and symptoms of anxiety.  Discussed thoughts, feelings, and physical responses.  Individuals gave examples of ones they experience in different areas.       All used a "Burns Anxiety Inventory" to help identify on a scale of 0=Not at all; 1=Somewhat; 2=Moderately; 3=A Lot.  Group used a "colored" writing instrument to identify, wrote their name and date on the inventory.  They are encouraged to reevaluate their scores in a couple of weeks to monitor areas of progress or areas of regression.       All participated.  All shared the area(s) that felt were most significant.       Donnice's Participation and Response: [name] shared multiple stressors over the recent past and those that have accumulated over the past couple of years. She is worries about her ankle (is going to wound clinic Oct. 27th), back is in significant pain today (she struggles to walk using her walker), she is short winded (oxygen on 2 liters), frequent urination, and she continues to miss her spouse. She scored at least somewhat in all the identified questions in feelings and thoughts.  While she did not have outstanding scores in a specific example in a specified category, she had almost all in the somewhat which when totaled most likely indicates there is more anxiety worry, nervousness, or fear that she recognizes.       Suicidal/Homicidal Risk:  Currently denies SI/HI and expresses willingness to contact crisis services if needed.

## 2022-04-23 NOTE — Group Note (Signed)
BEHAVIORAL MEDICINE, [facility] [place]  [place] [address]  Operated by [facility]  Group Note             Name: Kathleen Munoz   Date of Birth: August 03, 1945   Today's Date: 04/23/2022   Group Start Time: 10:30 AM   Group End Time: 11:29 AM   Group Topic: Intensive Outpatient Program  Number of participants: 4      Summary of group discussion:   Group examined symptoms of stress they have experienced to any significant degree during the last month.  A Stress Symptoms Checklist was given to use that included 25-30 different symptoms to choose from in the area of Physical Symptoms and 25-30 different symptoms to choose from in the area of Psychological Symptoms.  Members were also encourage to include symptoms not included in the lists.       All were active participants.  All scores within a "High Range" of stress.      [name]'s Participation and Response: Kathleen Munoz included fatigue, pain, forgetfulness, loneliness and crying spells.      Suicidal/Homicidal Risk:  Currently denies SI/HI and expresses willingness to contact crisis services if needed.

## 2022-04-24 ENCOUNTER — Other Ambulatory Visit: Payer: Self-pay

## 2022-04-24 ENCOUNTER — Ambulatory Visit: Payer: Medicare Other | Attending: Psychiatry

## 2022-04-24 DIAGNOSIS — F332 Major depressive disorder, recurrent severe without psychotic features: Secondary | ICD-10-CM | POA: Insufficient documentation

## 2022-04-24 NOTE — Group Note (Signed)
BEHAVIORAL MEDICINE, [facility] [place]  [address]  [place] [address]  Operated by [facility]  Group Note             Name: Kathleen Munoz   Date of Birth: [birth year]   Today's Date: 04/24/2022   Group Start Time:  9:30 AM   Group End Time: 10:28 AM   Group Topic: Intensive Outpatient Program  Number of participants: 6      Summary of group discussion:   Group recorded personal information in their Health Journals.  Examples of this:  rankings levels of depression, anxiety, pain, self-esteem.  Also blood pressure, heart rate and oxygen levels were recorded by individuals if they chose to do so.           Group members are encouraged to identify at least one recent "thankful" they have.        All wrote a response (or more) in the Attitude of Gratitude located in the back of their journals.  All shared at least one response with the group.      Kathleen Munoz's Participation and Response: Kathleen Munoz is thankful for her daughter's help at her home.  She has stayed the past two nights to help her with tasks around the house, something to eat and for her safety.  She gave thanks that she was able to get out of bed (took a long time) and able to come to  group.        Suicidal/Homicidal Risk:  Currently denies SI/HI and expresses willingness to contact crisis services if needed.

## 2022-04-24 NOTE — Group Note (Signed)
BEHAVIORAL MEDICINE[facility] [place]  [place] [address]  Operated by [facility]  Group Note             Name: Kathleen Munoz   Date of Birth: [birth year]   Today's Date: 04/24/2022   Group Start Time: 10:30 AM   Group End Time: 11:29 AM   Group Topic: Intensive Outpatient Program  Number of participants: 6      Summary of group discussion:   Group discussed progress made on October goals.  All were encouraged to identify areas they are addressing, some accomplished, and perhaps additional activities they have addressed that they did not originally put on their goal lists.       Some were unable to identify "successes".  Other group members reminded they of what they have accomplished and areas they are addressing.         All group members verbally shared.  Positive praise, support and encouragement was given one to another.      Tawny's Participation and Response: Kathleen Munoz reflected back on coming to this facility, inpatient when she did not want to.  She said she is doing a few things daily (getting dressed) and she has been drinking more water.      Suicidal/Homicidal Risk:  Currently denies SI/HI and expresses willingness to contact crisis services if needed.

## 2022-04-24 NOTE — Group Note (Signed)
BEHAVIORAL MEDICINE[facility] [place]  [place]  [place] [address]  Operated by [facility]  Group Note             Name: Kathleen Munoz   Date of Birth: 06-May-1946   Today's Date: 04/24/2022   Group Start Time: 11:30 AM   Group End Time: 12:30 PM   Group Topic: Intensive Outpatient Program  Number of participants: 6      Summary of group discussion:   Group discussed red and green flags to look for in unhealthy and healthy relationships.       All gave examples.  All were challenged to think of one outside of dishonesty and trust.  They were also challenged to give one that someone had not already given.       All participated.  They appeared to enjoy the "challenge" and validated the others responses.      Kathleen Munoz's Participation and Response: [name] identified those that do all the talking and want their way.      Suicidal/Homicidal Risk:  Currently denies SI/HI and expresses willingness to contact crisis services if needed.

## 2022-04-28 ENCOUNTER — Ambulatory Visit (HOSPITAL_PSYCHIATRIC): Payer: Medicare Other

## 2022-04-30 ENCOUNTER — Ambulatory Visit (HOSPITAL_PSYCHIATRIC): Payer: Medicare Other

## 2022-05-01 ENCOUNTER — Encounter (INDEPENDENT_AMBULATORY_CARE_PROVIDER_SITE_OTHER): Payer: Self-pay | Admitting: NURSE PRACTITIONER

## 2022-05-01 ENCOUNTER — Ambulatory Visit (HOSPITAL_PSYCHIATRIC): Payer: Medicare Other

## 2022-05-08 ENCOUNTER — Other Ambulatory Visit (HOSPITAL_PSYCHIATRIC): Payer: Self-pay | Admitting: Psychiatry

## 2022-05-08 NOTE — Telephone Encounter (Signed)
No longer under prescriber. Follows up with Dr Joslyn Devon at [facility].

## 2022-05-14 ENCOUNTER — Other Ambulatory Visit (HOSPITAL_COMMUNITY): Payer: Self-pay | Admitting: Pulmonary Disease

## 2022-05-14 DIAGNOSIS — I2699 Other pulmonary embolism without acute cor pulmonale: Secondary | ICD-10-CM

## 2022-05-27 ENCOUNTER — Encounter (INDEPENDENT_AMBULATORY_CARE_PROVIDER_SITE_OTHER): Payer: Medicare Other | Admitting: NURSE PRACTITIONER

## 2022-06-17 ENCOUNTER — Inpatient Hospital Stay
Admission: RE | Admit: 2022-06-17 | Discharge: 2022-06-17 | Disposition: A | Discharge status: Home / Self Care | Payer: Medicare Other | Source: Ambulatory Visit | Attending: Pulmonary Disease | Admitting: Pulmonary Disease

## 2022-06-17 ENCOUNTER — Other Ambulatory Visit: Payer: Self-pay

## 2022-06-17 ENCOUNTER — Other Ambulatory Visit (HOSPITAL_COMMUNITY): Payer: Medicare Other

## 2022-06-17 DIAGNOSIS — I2699 Other pulmonary embolism without acute cor pulmonale: Secondary | ICD-10-CM

## 2022-06-17 DIAGNOSIS — I517 Cardiomegaly: Secondary | ICD-10-CM

## 2022-06-17 DIAGNOSIS — R918 Other nonspecific abnormal finding of lung field: Secondary | ICD-10-CM

## 2022-06-17 LAB — CREATININE WITH EGFR
CREATININE: 1.05 mg/dL (ref 0.60–1.30)
ESTIMATED GFR: 55 mL/min/{1.73_m2} — ABNORMAL LOW (ref >59)

## 2022-06-17 MED ORDER — IOHEXOL 350 MG IODINE/ML INTRAVENOUS SOLUTION
50.0000 mL | INTRAVENOUS | Status: AC
Start: 2022-06-17 — End: 2022-06-17
  Administered 2022-06-17: 60 mL via INTRAVENOUS

## 2022-06-23 ENCOUNTER — Encounter (INDEPENDENT_AMBULATORY_CARE_PROVIDER_SITE_OTHER): Payer: Medicare Other | Admitting: NURSE PRACTITIONER

## 2022-06-23 ENCOUNTER — Other Ambulatory Visit (HOSPITAL_PSYCHIATRIC): Payer: Self-pay | Admitting: Psychiatry

## 2022-06-24 NOTE — Telephone Encounter (Signed)
No longer under prescriber. Follows up with Dr Joslyn Devon at [facility] Thanks!

## 2022-07-02 ENCOUNTER — Other Ambulatory Visit: Payer: Self-pay

## 2022-07-02 ENCOUNTER — Inpatient Hospital Stay (HOSPITAL_BASED_OUTPATIENT_CLINIC_OR_DEPARTMENT_OTHER): Payer: Medicare Other

## 2022-07-02 ENCOUNTER — Encounter (HOSPITAL_COMMUNITY): Payer: Self-pay

## 2022-07-02 ENCOUNTER — Emergency Department (HOSPITAL_COMMUNITY): Payer: Medicare Other

## 2022-07-02 ENCOUNTER — Inpatient Hospital Stay
Admission: EM | Admit: 2022-07-02 | Discharge: 2022-07-04 | DRG: 175 | Disposition: A | Discharge status: Home / Self Care | Payer: Medicare Other | Attending: INTERNAL MEDICINE | Admitting: INTERNAL MEDICINE

## 2022-07-02 DIAGNOSIS — R059 Cough, unspecified: Secondary | ICD-10-CM

## 2022-07-02 DIAGNOSIS — K219 Gastro-esophageal reflux disease without esophagitis: Secondary | ICD-10-CM | POA: Diagnosis present

## 2022-07-02 DIAGNOSIS — Z7982 Long term (current) use of aspirin: Secondary | ICD-10-CM

## 2022-07-02 DIAGNOSIS — J449 Chronic obstructive pulmonary disease, unspecified: Secondary | ICD-10-CM

## 2022-07-02 DIAGNOSIS — I82432 Acute embolism and thrombosis of left popliteal vein: Secondary | ICD-10-CM | POA: Diagnosis present

## 2022-07-02 DIAGNOSIS — I10 Essential (primary) hypertension: Secondary | ICD-10-CM | POA: Diagnosis present

## 2022-07-02 DIAGNOSIS — F329 Major depressive disorder, single episode, unspecified: Secondary | ICD-10-CM | POA: Diagnosis present

## 2022-07-02 DIAGNOSIS — I2694 Multiple subsegmental pulmonary emboli without acute cor pulmonale: Principal | ICD-10-CM | POA: Diagnosis present

## 2022-07-02 DIAGNOSIS — Z1152 Encounter for screening for COVID-19: Secondary | ICD-10-CM

## 2022-07-02 DIAGNOSIS — R0602 Shortness of breath: Secondary | ICD-10-CM

## 2022-07-02 DIAGNOSIS — Z515 Encounter for palliative care: Secondary | ICD-10-CM

## 2022-07-02 DIAGNOSIS — E079 Disorder of thyroid, unspecified: Secondary | ICD-10-CM | POA: Diagnosis present

## 2022-07-02 DIAGNOSIS — Z79899 Other long term (current) drug therapy: Secondary | ICD-10-CM

## 2022-07-02 DIAGNOSIS — I82411 Acute embolism and thrombosis of right femoral vein: Secondary | ICD-10-CM | POA: Diagnosis present

## 2022-07-02 DIAGNOSIS — Z79890 Hormone replacement therapy (postmenopausal): Secondary | ICD-10-CM

## 2022-07-02 DIAGNOSIS — E119 Type 2 diabetes mellitus without complications: Secondary | ICD-10-CM | POA: Diagnosis present

## 2022-07-02 DIAGNOSIS — G4730 Sleep apnea, unspecified: Secondary | ICD-10-CM | POA: Diagnosis present

## 2022-07-02 DIAGNOSIS — Z86711 Personal history of pulmonary embolism: Secondary | ICD-10-CM

## 2022-07-02 DIAGNOSIS — Z7901 Long term (current) use of anticoagulants: Secondary | ICD-10-CM

## 2022-07-02 DIAGNOSIS — L97319 Non-pressure chronic ulcer of right ankle with unspecified severity: Secondary | ICD-10-CM | POA: Diagnosis present

## 2022-07-02 DIAGNOSIS — Z87891 Personal history of nicotine dependence: Secondary | ICD-10-CM

## 2022-07-02 DIAGNOSIS — R001 Bradycardia, unspecified: Secondary | ICD-10-CM | POA: Diagnosis present

## 2022-07-02 DIAGNOSIS — I2699 Other pulmonary embolism without acute cor pulmonale: Principal | ICD-10-CM | POA: Diagnosis present

## 2022-07-02 DIAGNOSIS — J9620 Acute and chronic respiratory failure, unspecified whether with hypoxia or hypercapnia: Secondary | ICD-10-CM | POA: Diagnosis present

## 2022-07-02 DIAGNOSIS — J210 Acute bronchiolitis due to respiratory syncytial virus: Secondary | ICD-10-CM | POA: Diagnosis present

## 2022-07-02 DIAGNOSIS — R058 Other specified cough: Secondary | ICD-10-CM

## 2022-07-02 DIAGNOSIS — J440 Chronic obstructive pulmonary disease with acute lower respiratory infection: Secondary | ICD-10-CM | POA: Diagnosis present

## 2022-07-02 DIAGNOSIS — R609 Edema, unspecified: Secondary | ICD-10-CM

## 2022-07-02 DIAGNOSIS — J441 Chronic obstructive pulmonary disease with (acute) exacerbation: Secondary | ICD-10-CM | POA: Diagnosis present

## 2022-07-02 DIAGNOSIS — Z7984 Long term (current) use of oral hypoglycemic drugs: Secondary | ICD-10-CM

## 2022-07-02 HISTORY — DX: Cellulitis of right lower limb: L03.115

## 2022-07-02 HISTORY — DX: Cutaneous abscess of right lower limb: L02.415

## 2022-07-02 HISTORY — DX: Major depressive disorder, single episode, unspecified: F32.9

## 2022-07-02 LAB — COVID-19, FLU A/B, RSV RAPID BY PCR
INFLUENZA VIRUS TYPE A: NOT DETECTED
INFLUENZA VIRUS TYPE B: NOT DETECTED
RESPIRATORY SYNCTIAL VIRUS (RSV): DETECTED — AB
SARS-CoV-2: NOT DETECTED

## 2022-07-02 LAB — MANUAL DIFFERENTIAL
BAND %: 1 % — ABNORMAL LOW (ref 5–11)
BANDS NEUTROPHILS MANUAL: 1
LYMPHOCYTE %: 23 % — ABNORMAL LOW (ref 25–45)
LYMPHOCYTE ABSOLUTE: 3.47 10*3/uL (ref 1.10–5.00)
LYMPHOCYTES MANUAL: 23
MONOCYTE %: 9 % (ref 0–12)
MONOCYTE ABSOLUTE: 1.36 10*3/uL — ABNORMAL HIGH (ref 0.00–1.30)
MONOCYTES MANUAL: 9
NEUTROPHIL %: 67 % (ref 40–76)
NEUTROPHIL ABSOLUTE: 10.27 10*3/uL — ABNORMAL HIGH (ref 1.80–8.40)
NEUTROPHILS MANUAL: 67
PLATELET MORPHOLOGY COMMENT: NORMAL
TOTAL CELLS COUNTED [#] IN BLOOD: 100
WBC: 15.1 10*3/uL

## 2022-07-02 LAB — COMPREHENSIVE METABOLIC PANEL, NON-FASTING
ALBUMIN/GLOBULIN RATIO: 1.4 (ref 0.8–1.4)
ALBUMIN: 4.2 g/dL (ref 3.5–5.7)
ALKALINE PHOSPHATASE: 54 U/L (ref 34–104)
ALT (SGPT): 60 U/L — ABNORMAL HIGH (ref 7–52)
ANION GAP: 8 mmol/L (ref 4–13)
AST (SGOT): 37 U/L (ref 13–39)
BILIRUBIN TOTAL: 0.3 mg/dL (ref 0.3–1.2)
BUN/CREA RATIO: 19 (ref 6–22)
BUN: 24 mg/dL (ref 7–25)
CALCIUM, CORRECTED: 9.5 mg/dL (ref 8.9–10.8)
CALCIUM: 9.7 mg/dL (ref 8.6–10.3)
CHLORIDE: 104 mmol/L (ref 98–107)
CO2 TOTAL: 27 mmol/L (ref 21–31)
CREATININE: 1.24 mg/dL (ref 0.60–1.30)
ESTIMATED GFR: 45 mL/min/{1.73_m2} — ABNORMAL LOW (ref >59)
GLOBULIN: 3 (ref 2.9–5.4)
GLUCOSE: 84 mg/dL (ref 74–109)
OSMOLALITY, CALCULATED: 281 mOsm/kg (ref 270–290)
POTASSIUM: 4.5 mmol/L (ref 3.5–5.1)
PROTEIN TOTAL: 7.2 g/dL (ref 6.4–8.9)
SODIUM: 139 mmol/L (ref 136–145)

## 2022-07-02 LAB — URINALYSIS, MICROSCOPIC
RBCS: <1 /hpf (ref <4)
SQUAMOUS EPITHELIAL: 1 /hpf (ref <28)
WBCS: 2 /hpf (ref <6)

## 2022-07-02 LAB — URINALYSIS, MACROSCOPIC
BILIRUBIN: NEGATIVE mg/dL
BLOOD: NEGATIVE mg/dL
GLUCOSE: NEGATIVE mg/dL
KETONES: NEGATIVE mg/dL
LEUKOCYTES: NEGATIVE WBCs/uL
NITRITE: NEGATIVE
PH: 5.5 (ref 5.0–9.0)
PROTEIN: NEGATIVE mg/dL
SPECIFIC GRAVITY: 1.014 (ref 1.002–1.030)
UROBILINOGEN: NORMAL mg/dL

## 2022-07-02 LAB — CBC WITH DIFF
HCT: 38.8 % (ref 31.2–41.9)
HGB: 12.6 g/dL (ref 10.9–14.3)
MCH: 28.1 pg (ref 24.7–32.8)
MCHC: 32.6 g/dL (ref 32.3–35.6)
MCV: 86 fL (ref 75.5–95.3)
MPV: 8.4 fL (ref 7.9–10.8)
PLATELETS: 283 10*3/uL (ref 140–440)
RBC: 4.5 10*6/uL (ref 3.63–4.92)
RDW: 13.2 % (ref 12.3–17.7)
WBC: 15.1 10*3/uL — ABNORMAL HIGH (ref 3.8–11.8)

## 2022-07-02 LAB — PT/INR
INR: 1.03 (ref 0.84–1.10)
PROTHROMBIN TIME: 11.9 seconds (ref 9.8–12.7)

## 2022-07-02 LAB — ARTERIAL BLOOD GAS/LACTATE
%FIO2 (ARTERIAL): 28 %
BASE EXCESS (ARTERIAL): 2.1 mmol/L — ABNORMAL HIGH (ref 0.0–2.0)
BICARBONATE (ARTERIAL): 26 mmol/L (ref 20.0–26.0)
CARBOXYHEMOGLOBIN: 0.8 % (ref <=1.5)
LACTATE: 1 mmol/L (ref <=2.0)
MET-HEMOGLOBIN: 0.4 % (ref <=2.0)
O2CT: 17 %
OXYHEMOGLOBIN: 95.9 % (ref 88.0–100.0)
PCO2 (ARTERIAL): 43 mm/Hg (ref 35–45)
PH (ARTERIAL): 7.41 (ref 7.35–7.45)
PO2 (ARTERIAL): 89 mm/Hg (ref 80–100)

## 2022-07-02 LAB — PTT (PARTIAL THROMBOPLASTIN TIME)
APTT: 31 seconds (ref 25.0–38.0)
APTT: 129.5 seconds (ref 25.0–38.0)

## 2022-07-02 LAB — TROPONIN-I: TROPONIN I: 9 ng/L (ref <15)

## 2022-07-02 LAB — MAGNESIUM: MAGNESIUM: 1.7 mg/dL — ABNORMAL LOW (ref 1.9–2.7)

## 2022-07-02 LAB — D-DIMER: D-DIMER: 2550 ng/mL FEU (ref 215–500)

## 2022-07-02 LAB — LACTIC ACID LEVEL W/ REFLEX FOR LEVEL >2.0: LACTIC ACID: 2 mmol/L (ref 0.5–2.2)

## 2022-07-02 MED ORDER — LEVOTHYROXINE 88 MCG TABLET
88.0000 ug | ORAL_TABLET | Freq: Every morning | ORAL | Status: DC
Start: 2022-07-02 — End: 2022-07-04
  Administered 2022-07-02 – 2022-07-04 (×3): 88 ug via ORAL
  Filled 2022-07-02 (×2): qty 1

## 2022-07-02 MED ORDER — BUSPIRONE 5 MG TABLET
ORAL_TABLET | ORAL | Status: AC
Start: 2022-07-02 — End: 2022-07-02
  Filled 2022-07-02: qty 2

## 2022-07-02 MED ORDER — IPRATROPIUM 0.5 MG-ALBUTEROL 3 MG (2.5 MG BASE)/3 ML NEBULIZATION SOLN
INHALATION_SOLUTION | RESPIRATORY_TRACT | Status: AC
Start: 2022-07-02 — End: 2022-07-02
  Filled 2022-07-02: qty 3

## 2022-07-02 MED ORDER — METHYLPREDNISOLONE SOD SUCC 125 MG SOLUTION FOR INJECTION WRAPPER
62.5000 mg | Freq: Three times a day (TID) | INTRAVENOUS | Status: DC
Start: 2022-07-02 — End: 2022-07-04
  Administered 2022-07-02 – 2022-07-04 (×6): 62.5 mg via INTRAVENOUS
  Filled 2022-07-02 (×5): qty 2

## 2022-07-02 MED ORDER — IPRATROPIUM 0.5 MG-ALBUTEROL 3 MG (2.5 MG BASE)/3 ML NEBULIZATION SOLN
3.0000 mL | INHALATION_SOLUTION | Freq: Four times a day (QID) | RESPIRATORY_TRACT | Status: DC
Start: 2022-07-02 — End: 2022-07-04
  Administered 2022-07-02 (×3): 3 mL via RESPIRATORY_TRACT
  Administered 2022-07-03: 0 mL via RESPIRATORY_TRACT
  Administered 2022-07-03 – 2022-07-04 (×6): 3 mL via RESPIRATORY_TRACT
  Filled 2022-07-02 (×4): qty 3

## 2022-07-02 MED ORDER — LORATADINE 10 MG TABLET
10.0000 mg | ORAL_TABLET | Freq: Every day | ORAL | Status: DC
Start: 2022-07-02 — End: 2022-07-04
  Administered 2022-07-02 – 2022-07-04 (×3): 10 mg via ORAL
  Filled 2022-07-02 (×2): qty 1

## 2022-07-02 MED ORDER — BUPROPION HCL SR 150 MG TABLET,12 HR SUSTAINED-RELEASE
150.0000 mg | ORAL_TABLET | Freq: Two times a day (BID) | ORAL | Status: DC
Start: 2022-07-03 — End: 2022-07-04
  Administered 2022-07-03 – 2022-07-04 (×4): 150 mg via ORAL
  Filled 2022-07-02 (×6): qty 1

## 2022-07-02 MED ORDER — IPRATROPIUM 0.5 MG-ALBUTEROL 3 MG (2.5 MG BASE)/3 ML NEBULIZATION SOLN
3.0000 mL | INHALATION_SOLUTION | RESPIRATORY_TRACT | Status: AC
Start: 2022-07-02 — End: 2022-07-02
  Administered 2022-07-02: 3 mL via RESPIRATORY_TRACT

## 2022-07-02 MED ORDER — LISINOPRIL 20 MG TABLET
ORAL_TABLET | ORAL | Status: AC
Start: 2022-07-02 — End: 2022-07-02
  Filled 2022-07-02: qty 1

## 2022-07-02 MED ORDER — GABAPENTIN 300 MG CAPSULE
ORAL_CAPSULE | ORAL | Status: AC
Start: 2022-07-02 — End: 2022-07-02
  Filled 2022-07-02: qty 1

## 2022-07-02 MED ORDER — GABAPENTIN 300 MG CAPSULE
300.0000 mg | ORAL_CAPSULE | Freq: Three times a day (TID) | ORAL | Status: DC
Start: 2022-07-02 — End: 2022-07-04
  Administered 2022-07-02 – 2022-07-04 (×7): 300 mg via ORAL
  Filled 2022-07-02 (×6): qty 1

## 2022-07-02 MED ORDER — ASPIRIN 81 MG CHEWABLE TABLET
CHEWABLE_TABLET | ORAL | Status: AC
Start: 2022-07-02 — End: 2022-07-02
  Filled 2022-07-02: qty 1

## 2022-07-02 MED ORDER — SODIUM CHLORIDE 0.9 % INTRAVENOUS PIGGYBACK
2.0000 g | INTRAVENOUS | Status: DC
Start: 2022-07-02 — End: 2022-07-03
  Administered 2022-07-02: 2 g via INTRAVENOUS
  Administered 2022-07-02: 0 g via INTRAVENOUS

## 2022-07-02 MED ORDER — CITALOPRAM 10 MG TABLET
40.0000 mg | ORAL_TABLET | Freq: Every day | ORAL | Status: DC
Start: 2022-07-02 — End: 2022-07-04
  Administered 2022-07-02 – 2022-07-04 (×3): 40 mg via ORAL
  Filled 2022-07-02 (×2): qty 4

## 2022-07-02 MED ORDER — ASPIRIN 81 MG CHEWABLE TABLET
81.0000 mg | CHEWABLE_TABLET | Freq: Every day | ORAL | Status: DC
Start: 2022-07-02 — End: 2022-07-04
  Administered 2022-07-02 – 2022-07-04 (×3): 81 mg via ORAL
  Filled 2022-07-02 (×2): qty 1

## 2022-07-02 MED ORDER — CLONAZEPAM 1 MG TABLET
1.0000 mg | ORAL_TABLET | Freq: Three times a day (TID) | ORAL | Status: DC
Start: 2022-07-02 — End: 2022-07-04
  Administered 2022-07-02 – 2022-07-04 (×7): 1 mg via ORAL
  Filled 2022-07-02 (×6): qty 1

## 2022-07-02 MED ORDER — TRAZODONE 100 MG TABLET
200.0000 mg | ORAL_TABLET | Freq: Every evening | ORAL | Status: DC
Start: 2022-07-02 — End: 2022-07-04
  Administered 2022-07-02 – 2022-07-03 (×2): 200 mg via ORAL
  Filled 2022-07-02 (×2): qty 2

## 2022-07-02 MED ORDER — CEFTRIAXONE 2 GRAM SOLUTION FOR INJECTION
INTRAMUSCULAR | Status: AC
Start: 2022-07-02 — End: 2022-07-02
  Filled 2022-07-02: qty 20

## 2022-07-02 MED ORDER — METHYLPREDNISOLONE SOD SUCC 125 MG SOLUTION FOR INJECTION WRAPPER
INTRAVENOUS | Status: AC
Start: 2022-07-02 — End: 2022-07-02
  Filled 2022-07-02: qty 2

## 2022-07-02 MED ORDER — ACETAMINOPHEN 325 MG TABLET
650.0000 mg | ORAL_TABLET | ORAL | Status: DC | PRN
Start: 2022-07-02 — End: 2022-07-04
  Administered 2022-07-04: 650 mg via ORAL
  Filled 2022-07-02: qty 2

## 2022-07-02 MED ORDER — PANTOPRAZOLE 40 MG TABLET,DELAYED RELEASE
40.0000 mg | DELAYED_RELEASE_TABLET | Freq: Every day | ORAL | Status: DC
Start: 2022-07-02 — End: 2022-07-04
  Administered 2022-07-02 – 2022-07-04 (×3): 40 mg via ORAL
  Filled 2022-07-02 (×3): qty 1

## 2022-07-02 MED ORDER — MAGNESIUM OXIDE 400 MG (241.3 MG MAGNESIUM) TABLET
400.0000 mg | ORAL_TABLET | ORAL | Status: AC
Start: 2022-07-02 — End: 2022-07-02
  Administered 2022-07-02: 400 mg via ORAL

## 2022-07-02 MED ORDER — HEPARIN (PORCINE) 5,000 UNITS/ML BOLUS
4000.0000 [IU] | Freq: Once | INTRAMUSCULAR | Status: AC
Start: 2022-07-02 — End: 2022-07-02
  Administered 2022-07-02: 4000 [IU] via INTRAVENOUS

## 2022-07-02 MED ORDER — BUSPIRONE 5 MG TABLET
10.0000 mg | ORAL_TABLET | Freq: Three times a day (TID) | ORAL | Status: DC
Start: 2022-07-02 — End: 2022-07-04
  Administered 2022-07-02 – 2022-07-04 (×7): 10 mg via ORAL
  Filled 2022-07-02 (×6): qty 2

## 2022-07-02 MED ORDER — LISINOPRIL 20 MG-HYDROCHLOROTHIAZIDE 12.5 MG TABLET
1.0000 | ORAL_TABLET | Freq: Every day | ORAL | Status: DC
Start: 2022-07-02 — End: 2022-07-02

## 2022-07-02 MED ORDER — HEPARIN (PORCINE) 5,000 UNIT/ML INJECTION SOLUTION
INTRAMUSCULAR | Status: AC
Start: 2022-07-02 — End: 2022-07-02
  Filled 2022-07-02: qty 1

## 2022-07-02 MED ORDER — HYDROCHLOROTHIAZIDE 25 MG TABLET
12.5000 mg | ORAL_TABLET | Freq: Every day | ORAL | Status: DC
Start: 2022-07-02 — End: 2022-07-04
  Administered 2022-07-02 – 2022-07-04 (×3): 12.5 mg via ORAL
  Filled 2022-07-02 (×2): qty 1

## 2022-07-02 MED ORDER — LEVOTHYROXINE 88 MCG TABLET
ORAL_TABLET | ORAL | Status: AC
Start: 2022-07-02 — End: 2022-07-02
  Filled 2022-07-02: qty 1

## 2022-07-02 MED ORDER — SODIUM CHLORIDE 0.9 % INTRAVENOUS SOLUTION
INTRAVENOUS | Status: AC
Start: 2022-07-02 — End: 2022-07-02
  Filled 2022-07-02: qty 5

## 2022-07-02 MED ORDER — MAGNESIUM OXIDE 400 MG (241.3 MG MAGNESIUM) TABLET
ORAL_TABLET | ORAL | Status: AC
Start: 2022-07-02 — End: 2022-07-02
  Filled 2022-07-02: qty 1

## 2022-07-02 MED ORDER — HYDROCHLOROTHIAZIDE 25 MG TABLET
ORAL_TABLET | ORAL | Status: AC
Start: 2022-07-02 — End: 2022-07-02
  Filled 2022-07-02: qty 1

## 2022-07-02 MED ORDER — SODIUM CHLORIDE 0.9 % INTRAVENOUS SOLUTION
500.0000 mg | INTRAVENOUS | Status: DC
Start: 2022-07-02 — End: 2022-07-03
  Administered 2022-07-02: 500 mg via INTRAVENOUS
  Administered 2022-07-02: 0 mg via INTRAVENOUS

## 2022-07-02 MED ORDER — CLONAZEPAM 1 MG TABLET
ORAL_TABLET | ORAL | Status: AC
Start: 2022-07-02 — End: 2022-07-02
  Filled 2022-07-02: qty 1

## 2022-07-02 MED ORDER — IOHEXOL 350 MG IODINE/ML INTRAVENOUS SOLUTION
80.0000 mL | INTRAVENOUS | Status: AC
Start: 2022-07-02 — End: 2022-07-02
  Administered 2022-07-02: 80 mL via INTRAVENOUS

## 2022-07-02 MED ORDER — BUDESONIDE 0.5 MG/2 ML SUSPENSION FOR NEBULIZATION
0.5000 mg | INHALATION_SUSPENSION | Freq: Two times a day (BID) | RESPIRATORY_TRACT | Status: DC
Start: 2022-07-02 — End: 2022-07-04
  Administered 2022-07-02 – 2022-07-04 (×5): 0.5 mg via RESPIRATORY_TRACT
  Filled 2022-07-02: qty 2

## 2022-07-02 MED ORDER — CITALOPRAM 10 MG TABLET
ORAL_TABLET | ORAL | Status: AC
Start: 2022-07-02 — End: 2022-07-02
  Filled 2022-07-02: qty 4

## 2022-07-02 MED ORDER — HEPARIN (PORCINE) 25,000 UNIT/250 ML (100 UNIT/ML) IN DEXTROSE 5 % IV
INTRAVENOUS | Status: AC
Start: 2022-07-02 — End: 2022-07-02
  Filled 2022-07-02: qty 250

## 2022-07-02 MED ORDER — HEPARIN (PORCINE) 25,000 UNIT/250 ML (100 UNIT/ML) IN DEXTROSE 5 % IV
18.0000 [IU]/kg/h | INTRAVENOUS | Status: DC
Start: 2022-07-02 — End: 2022-07-03
  Administered 2022-07-02: 15 [IU]/kg/h via INTRAVENOUS
  Administered 2022-07-02: 18 [IU]/kg/h via INTRAVENOUS
  Administered 2022-07-02 – 2022-07-03 (×2): 0 [IU]/kg/h via INTRAVENOUS
  Administered 2022-07-03: 13 [IU]/kg/h via INTRAVENOUS

## 2022-07-02 MED ORDER — SODIUM CHLORIDE 0.9 % INTRAVENOUS PIGGYBACK
INJECTION | INTRAVENOUS | Status: AC
Start: 2022-07-02 — End: 2022-07-02
  Filled 2022-07-02: qty 50

## 2022-07-02 MED ORDER — BUDESONIDE 0.5 MG/2 ML SUSPENSION FOR NEBULIZATION
INHALATION_SUSPENSION | RESPIRATORY_TRACT | Status: AC
Start: 2022-07-02 — End: 2022-07-02
  Filled 2022-07-02: qty 2

## 2022-07-02 MED ORDER — LORATADINE 10 MG TABLET
ORAL_TABLET | ORAL | Status: AC
Start: 2022-07-02 — End: 2022-07-02
  Filled 2022-07-02: qty 1

## 2022-07-02 MED ORDER — LISINOPRIL 20 MG TABLET
20.0000 mg | ORAL_TABLET | Freq: Every day | ORAL | Status: DC
Start: 2022-07-02 — End: 2022-07-04
  Administered 2022-07-02 – 2022-07-04 (×3): 20 mg via ORAL
  Filled 2022-07-02 (×2): qty 1

## 2022-07-02 NOTE — ED Provider Notes (Signed)
[facility]  ED Primary Provider Note  Patient Name: Kathleen Munoz  Patient Age: 76 y.o.  Date of Birth: May 04, 1946    Chief Complaint: Shortness of Breath        History of Present Illness       Kathleen Munoz is a 76 y.o. female who had concerns including Shortness of Breath.  PATIENT PRESENTED TO THE EMERGENCY DEPARTMENT WITH COMPLAINTS OF SHORTNESS A BREATH THAT HAS BEEN ONGOING FOR APPROXIMATELY 1 WEEK.  PATIENT WAS SEEN BY HER PULMONOLOGIST APPROXIMATELY 1 WEEK AGO AND DIAGNOSED WITH BRONCHITIS.  SHE WAS PLACED ON AZITHROMYCIN AND STEROIDS.  SHE STATES THAT THE MEDICATION DID SEEM TO IMPROVE HER SYMPTOMS, BUT SHE HAS NEVER RETURNED BACK TO BASELINE.  PATIENT DOES WEAR 2 L OXYGEN SUPPLEMENTATION VIA NASAL CANNULA AT ALL TIMES, AS WELL AS CPAP AT NIGHT.  PATIENT STATES THAT SHE HAS NOTICED A COUGH PRODUCTIVE OF YELLOW SPUTUM, AS WELL AS SHORTNESS A BREATH THAT HAS NOT RESOLVED.  SHE DENIES ANY SIGNIFICANT CHEST PAIN OR SWELLING OF THE LOWER EXTREMITIES.  PATIENT DOES ADMIT TO HISTORY OF PULMONARY EMBOLI.  SHE DOES TAKE ELIQUIS B.I.D. AND HAS NOT MISSED ANY DOSES.  SHE DOES ADMIT TO WORSENING SHORTNESS A BREATH ON EXERTION, AS WELL AS HER HEART RATE BEING LOWER THAN NORMAL OVER THE PAST COUPLE OF WEEKS.  SHE STATES THAT IT IS NEVER OVER 60, BUT HAS BEEN RUNNING IN THE 40S AND 50S OVER THE PAST COUPLE OF WEEKS.  SHE DENIES ANY ASSOCIATED PAIN.  NOTHING REALLY SEEMS TO MAKE HER SYMPTOMS BETTER.  PATIENT IS IN NO APPARENT DISTRESS AND DENIES ANY FURTHER COMPLAINTS AT TIME OF EXAMINATION.        Review of Systems     No other overt Review of Systems are noted to be positive except noted in the HPI.      Historical Data   History Reviewed This Encounter: Medical History  Surgical History  Family History  Social History      Physical Exam   ED Triage Vitals [07/02/22 0943]   BP (Non-Invasive) (!) 166/73   Heart Rate 50   Respiratory Rate (!) 22   Temperature 36.3 C (97.3 F)    SpO2 95 %   Weight 94.3 kg (208 lb)   Height 1.626 m (_0 )         Nursing notes reviewed for what could be assessed. Past Medical, Surgical, and Social history reviewed for what has been completed.     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  Head: Normocephalic, atraumatic.  Mouth/Throat: no nasal discharge, posterior pharynx WNL  Eyes: EOM grossly intact, conjunctiva normal.  Neck: Supple  Cardiovascular:  BRADYCARDIC WITH A NORMAL RHYTHM  Pulmonary/Chest:  WHEEZING NOTED THROUGHOUT LUNG FIELDS.  Abdominal: Soft, non-tender, non-distended. Non peritoneal, no rebound, no guarding.  MSK: No Lower Extremity Edema.  Skin:  CHRONIC WOUND LEFT LOWER EXTREMITY  Neuro: Appropriate, CN II-XII grossly intact   Psych: Cooperative           Procedures      Patient Data     Labs Ordered/Reviewed   COMPREHENSIVE METABOLIC PANEL, NON-FASTING - Abnormal; Notable for the following components:       Result Value    ESTIMATED GFR 45 (*)     ALT (SGPT) 60 (*)     All other components within normal limits    Narrative:     Estimated Glomerular Filtration Rate (eGFR) is  calculated using the CKD-EPI (2021) equation, intended for patients 77 years of age and older. If gender is not documented or "unknown", there will be no eGFR calculation.     ARTERIAL BLOOD GAS/LACTATE - Abnormal; Notable for the following components:    BASE EXCESS (ARTERIAL) 2.1 (*)     All other components within normal limits   COVID-19, FLU A/B, RSV RAPID BY PCR - Abnormal; Notable for the following components:    RESPIRATORY SYNCTIAL VIRUS (RSV) Detected (*)     All other components within normal limits    Narrative:     Results are for the simultaneous qualitative identification of SARS-CoV-2 (formerly 2019-nCoV), Influenza A, Influenza B, and RSV RNA. These etiologic agents are generally detectable in nasopharyngeal and nasal swabs during the ACUTE PHASE of infection. Hence, this test is intended to be performed on  respiratory specimens collected from individuals with signs and symptoms of upper respiratory tract infection who meet Centers for Disease Control and Prevention (CDC) clinical and/or epidemiological criteria for Coronavirus Disease 2019 (COVID-19) testing. CDC COVID-19 criteria for testing on human specimens is available at [facility] webpage information for Healthcare Professionals: Coronavirus Disease 2019 (COVID-19) ([email or web address]).     False-negative results may occur if the virus has genomic mutations, insertions, deletions, or rearrangements or if performed very early in the course of illness. Otherwise, negative results indicate virus specific RNA targets are not detected, however negative results do not preclude SARS-CoV-2 infection/COVID-19, Influenza, or Respiratory syncytial virus infection. Results should not be used as the sole basis for patient management decisions. Negative results must be combined with clinical observations, patient history, and epidemiological information. If upper respiratory tract infection is still suspected based on exposure history together with other clinical findings, re-testing should be considered.    Disclaimer:   This assay has been authorized by FDA under an Emergency Use Authorization for use in laboratories certified under the Clinical Laboratory Improvement Amendments of 1988 (CLIA), 42 U.S.C. 501 469 9209, to perform high complexity tests. The impacts of vaccines, antiviral therapeutics, antibiotics, chemotherapeutic or immunosuppressant drugs have not been evaluated.     Test methodology:   Cepheid Xpert Xpress SARS-CoV-2/Flu/RSV Assay real-time polymerase chain reaction (RT-PCR) test on the GeneXpert Dx and Xpert Xpress systems.   D-DIMER - Abnormal; Notable for the following components:    D-DIMER 2,550 (*)     All other components within normal limits    Narrative:     D-Dimers are reported in FEU per ng/mL.    IF PATIENT IS  EXHIBITING SYMPTOMS DVT/PE, THIS D-DIMER RESULT MAY INDICATE A NEED FOR FURTHER TESTING FOR THESE CONDITIONS. IF PATIENT IS SUSPECTED OF DIC AND SYMPTOMS WORSEN OR PERSIST, A REPEAT DIC WORKUP SHOULD BE CONSIDERED.    NOTE: ALTHOUGH THE NORMAL RANGE FOR THIS TEST IS 215-500 ng/mL FEU, LITERATURE RECOMMENDS FURTHER TESTING FOR ANY RESULT >500ng/mL FEU.    A cut off value of 500ng/mL FEU or below can be used as an aid in the diagnosis of Thromboembolism when used in conjunction with the patient's medical history, clinical presentation and other findings. Results of 500ng/mL FEU or below have a negative predictive value of 100%.     MAGNESIUM - Abnormal; Notable for the following components:    MAGNESIUM 1.7 (*)     All other components within normal limits   CBC WITH DIFF - Abnormal; Notable for the following components:    WBC 15.1 (*)     All other components within normal limits   URINALYSIS,  MICROSCOPIC - Abnormal; Notable for the following components:    MUCOUS Rare (*)     All other components within normal limits   MANUAL DIFFERENTIAL - Abnormal; Notable for the following components:    LYMPHOCYTE % 23 (*)     BAND % 1 (*)     NEUTROPHIL ABSOLUTE 10.27 (*)     MONOCYTE ABSOLUTE 1.36 (*)     All other components within normal limits   LACTIC ACID LEVEL W/ REFLEX FOR LEVEL >2.0 - Normal   TROPONIN-I - Normal   PT/INR - Normal    Narrative:     INR OF 2.0-3.0  RECOMMENDED FOR: PROPHYLAXIS/TREATMENT OF VENEOUS THROMBOSIS, PULMONARY EMBOLISM, PREVENTION OF SYSTEMIC EMBOLISM FROM ATRIAL FIBRILATION, MYOCARDIAL INFARCTION.    INR OF 2.5-3.5  RECOMMENDED FOR MECHANICAL PROSTHETIC HEART VALVES, RECURRENT SYSTEMIC EMBOLISM, RECURRENT MYOCARDIAL INFARCTION.     PTT (PARTIAL THROMBOPLASTIN TIME) - Normal   URINALYSIS, MACROSCOPIC - Normal   ADULT ROUTINE BLOOD CULTURE, SET OF 2 BOTTLES (BACTERIA AND YEAST)   ADULT ROUTINE BLOOD CULTURE, SET OF 2 BOTTLES (BACTERIA AND YEAST)   CBC/DIFF    Narrative:     The following orders  were created for panel order CBC/DIFF.  Procedure                               Abnormality         Status                     ---------                               -----------         ------                     CBC WITH RSWN[[id number]]                Abnormal            Final result               MANUAL DIFFERENTIAL[[id number]]          Abnormal            Final result                 Please view results for these tests on the individual orders.   URINALYSIS, MACROSCOPIC AND MICROSCOPIC W/CULTURE REFLEX    Narrative:     The following orders were created for panel order URINALYSIS, MACROSCOPIC AND MICROSCOPIC W/CULTURE REFLEX.  Procedure                               Abnormality         Status                     ---------                               -----------         ------                     URINALYSIS, MACROSCOPIC[[id number]]      Normal  Final result               URINALYSIS, MICROSCOPIC[[id number]]      Abnormal            Final result                 Please view results for these tests on the individual orders.   PTT (PARTIAL THROMBOPLASTIN TIME)       CT ANGIO CHEST FOR PULMONARY EMBOLUS W IV CONTRAST   Final Result by Edi, Radresults In (12/20 1133)   Positive for bilateral pulmonary emboli. Clot burden is moderate. There are no changes to suggest right-sided heart failure.         One or more dose reduction techniques were used (e.g., Automated exposure control, adjustment of the mA and/or kV according to patient size, use of iterative reconstruction technique).      A Critical Document Only message has been documented for Avamarie Crossley in the [facility] Findings system on 07/02/2022 11:29 AM, Message ID [id number].         Radiologist location ID: [id number]         XR CHEST PA AND LATERAL   Final Result by Edi, Radresults In (12/20 1048)   Radiographic findings suggestive of bronchiolitis particularly about the left perihilar region.         Radiologist location ID:  [place] Decision Making          Medical Decision Making  Risk  Risk Details: CRITICAL CARE TIME DOCUMENTED FOR TREATMENT OF PULMONARY EMBOLI THAT FAILED OUTPATIENT TREATMENT, AS WELL AS BRADYCARDIA AND BRONCHIOLITIS WITH RSV.    Critical Care  Total time providing critical care: 37 minutes          Studies Assessed:  LAB WORK, IMAGING, EKG    EKG:   This EKG interpreted by me shows:    Rate:  47    Interpretation:  NORMAL AXIS, SINUS BRADYCARDIA, RATE 47, NONSPECIFIC ST-T WAVE CHANGES      MDM Narrative:  PATIENT PRESENTED TO THE EMERGENCY DEPARTMENT WITH COMPLAINTS OF SHORTNESS A BREATH THAT HAS BEEN ONGOING FOR APPROXIMATELY 1 WEEK.  PATIENT WAS SEEN BY HER PULMONOLOGIST APPROXIMATELY 1 WEEK AGO AND DIAGNOSED WITH BRONCHITIS.  SHE WAS PLACED ON AZITHROMYCIN AND STEROIDS.  SHE STATES THAT THE MEDICATION DID SEEM TO IMPROVE HER SYMPTOMS, BUT SHE HAS NEVER RETURNED BACK TO BASELINE.  PATIENT DOES WEAR 2 L OXYGEN SUPPLEMENTATION VIA NASAL CANNULA AT ALL TIMES, AS WELL AS CPAP AT NIGHT.  PATIENT STATES THAT SHE HAS NOTICED A COUGH PRODUCTIVE OF YELLOW SPUTUM, AS WELL AS SHORTNESS A BREATH THAT HAS NOT RESOLVED.  SHE DENIES ANY SIGNIFICANT CHEST PAIN OR SWELLING OF THE LOWER EXTREMITIES.  PATIENT DOES ADMIT TO HISTORY OF PULMONARY EMBOLI.  SHE DOES TAKE ELIQUIS B.I.D. AND HAS NOT MISSED ANY DOSES.  SHE DOES ADMIT TO WORSENING SHORTNESS A BREATH ON EXERTION, AS WELL AS HER HEART RATE BEING LOWER THAN NORMAL OVER THE PAST COUPLE OF WEEKS.  SHE STATES THAT IT IS NEVER OVER 60, BUT HAS BEEN RUNNING IN THE 40S AND 50S OVER THE PAST COUPLE OF WEEKS.  SHE DENIES ANY ASSOCIATED PAIN.  NOTHING REALLY SEEMS TO MAKE HER SYMPTOMS BETTER.  PATIENT IS IN NO APPARENT DISTRESS AND DENIES ANY FURTHER COMPLAINTS AT TIME OF EXAMINATION.  PHYSICAL EXAMINATION DOES REVEAL BRADYCARDIA, AS WELL AS WHEEZING THROUGHOUT THE LUNG FIELDS.  THERE  WAS NO SWELLING OF THE LOWER EXTREMITIES.  PATIENT IS RESTING COMFORTABLY AND  IN NO APPARENT DISTRESS.  PATIENT ON 2 L OXYGEN SUPPLEMENTATION VIA NASAL CANNULA AT TIME OF EXAMINATION.  DUONEB WAS ORDERED.  LAB WORK AND IMAGING WERE ORDERED.  PATIENT WAS STABLE.      ED Course as of 07/02/22 1230   Wed Jul 02, 2022   1018 ABG REVEALED:   PH 7.41, PCO2 43, BICARBONATE 26, PO2 89, LACTATE 1.0   1030 SODIUM 139, POTASSIUM 4.5, BUN 24, CREATININE 1.24, GLUCOSE 84, TOTAL BILIRUBIN 0.3, AST 37, ALT 60, TOTAL ALKALINE PHOSPHATASE 54, MAGNESIUM 1.7   1030 MAGNESIUM OXIDE ORDERED   1030 PTT 31.0, PT 11.9, INR 1.03, LACTIC ACID 2.0   1039 TROPONIN 9   1042 D-DIMER 2550   1043 WBC 15.1, HEMOGLOBIN 12.6, PLATELET COUNT 283   1050 CHEST X-RAY REVEALED:   The patient is slightly rotated to the left.     Faint left perihilar interstitial opacities with a few areas of peribronchial thickening are present which may be related to bronchiolitis-airways disease.     No consolidative airspace disease is identified. No pleural effusion or pneumothorax is seen.  Cardiac silhouette remains normal in size. Lungs remain symmetrically inflated. Trachea remains at midline.     IMPRESSION:  Radiographic findings suggestive of bronchiolitis particularly about the left perihilar region.   1102 RSV TESTING WAS POSITIVE   1102 CTA OF THE CHEST WAS ORDERED   1133 URINALYSIS DID NOT REVEAL EVIDENCE OF URINARY TRACT INFECTION   1134 CTA OF THE CHEST REVEALED:   Positive for bilateral pulmonary emboli. Clot burden is moderate. There are no changes to suggest right-sided heart failure.   [place].  HOSPITALIST CONSULTED TO EVALUATE FOR ADMISSION.         Medications Administered in the ED   heparin 25,000 units in D5W 250 mL infusion (18 Units/kg/hr  70.5 kg (Adjusted) Intravenous New Bag/New Syringe 07/02/22 1158)   magnesium oxide (MAG-OX) 487m (2425melemental magnesium) tablet (400 mg Oral Given 07/02/22 1048)   ipratropium-albuterol 0.5 mg-3 mg(2.5 mg base)/3 mL Solution for Nebulization (3 mL  Nebulization Given 07/02/22 1119)   iohexol (OMNIPAQUE 350) infusion (80 mL Intravenous Given 07/02/22 1112)   heparin 5,000 units/mL initial IV BOLUS (4,000 Units Intravenous Given 07/02/22 1157)       PATIENT ACCEPTED FOR ADMISSION UNDER HOSPITALIST SERVICE AT 11:59 A.M.  PATIENT WAS COUNSELED AND EDUCATED ON LABORATORY AND IMAGING FINDINGS.  ALL QUESTIONS WERE ANSWERED TO SATISFACTION.  SHE IS AGREEABLE TO ADMISSION.  PATIENT STABLE AT TIME OF ADMISSION.    Patient will be admitted to the  service for further workup and management.    Disposition: Admitted             Clinical Impression   Bilateral pulmonary embolism (CMS HCC) (Primary)   Chronic obstructive pulmonary disease, unspecified COPD type (CMS HCC)   Bradycardia   Hypomagnesemia         Current Discharge Medication List            WeThera FlakeDO

## 2022-07-02 NOTE — ED Nurses Note (Signed)
Sandwhich tray and drink given at this time

## 2022-07-02 NOTE — Care Plan (Signed)
Problem: Adult Inpatient Plan of Care  Goal: Patient-Specific Goal (Individualized)  Recent Flowsheet Documentation  Taken 07/02/2022 1940 by Enid Baas, RN  Individualized Care Needs: Monitor breathing, vitals  Anxieties, Fears or Concerns: anxious about dyspnea/wheezing and what she's going to do about christmas  Patient-Specific Goals (Include Timeframe): discharge home for christmas

## 2022-07-02 NOTE — ED Nurses Note (Signed)
Report given to 3W at this time.

## 2022-07-02 NOTE — ED Triage Notes (Signed)
Shortness of breath x1 week that has worsened this am. States cough and congestion. O2 cont at 2L. HX of PE 2x in the past year.

## 2022-07-02 NOTE — ED Nurses Note (Signed)
Transport at bedside to take pt. Tele pack hooked to patient. Heparin infusing but on pause. Care endorsed.

## 2022-07-02 NOTE — H&P (Signed)
07/02/2022  16:20  Kathleen Munoz  [id number]    Chief Complaint:  Shortness for breath    History:  76 year old female patient presented to the emergency department today for increasing shortness of breath.  She reports she has been sick for approximately last 7 days with shortness a breath, yellow thick productive cough.  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  She has been on Eliquis for over a year.  She is taking her Eliquis and is compliant with therapy.  She is also noticed her heart rate has been in the 40s over the last few days.  She has been very tired and dyspneic on exertion.  Patient went to her primary care provider and was treated for bronchitis with azithromycin and prednisone.  However symptoms have not improved.  ER physician cause hospitalist team today to advise patient has pulmonary embolus without evidence of right heart strain on CT scan.  Patient was admitted secondary to this reason.  She also denies chest pain, fever, chills.      Review of systems:  10 point review of systems was reviewed and negative except as pertinent negatives and positives mentioned in the interval history above.    Past Medical History  SITagliptin phosphate, albuterol sulfate, apixaban, aspirin, buPROPion, busPIRone, citalopram, clonazePAM, fexofenadine, gabapentin, ipratropium bromide, levothyroxine, lisinopriL-hydrochlorothiazide, metoprolol succinate, multivitamin, omeprazole, and traZODone   Allergies   Allergen Reactions    Influenza Virus Vaccines      Past Medical History:   Diagnosis Date    Anxiety     Arthritis     Cellulitis and abscess of right lower extremity     Depression     Diabetes mellitus, type 2 (CMS HCC)     Esophageal reflux     H/O blood clots     LUNGS    Hypertension     Major depressive disorder     Personal history of fall     Shortness of breath     Sleep apnea     CPAP    Thyroid disease          Past Surgical History:   Procedure Laterality  Date    HAND SURGERY Left     HX BUNIONECTOMY      HX TUBAL LIGATION           Family Medical History:    None         Social History     Socioeconomic History    Marital status: Widowed   Tobacco Use    Smoking status: Former     Packs/day: 1.00     Years: 15.00     Additional pack years: 0.00     Total pack years: 15.00     Types: Cigarettes     Passive exposure: Past    Smokeless tobacco: Never   Vaping Use    Vaping Use: Never used   Substance and Sexual Activity    Alcohol use: Not Currently    Drug use: Never    Sexual activity: Not Currently     Partners: Male     Social Determinants of Health     Social Connections: Medium Risk (04/08/2022)    Social Connections     SDOH Social Isolation: 3 to 5 times a week       Physical exam:  BP (!) 149/63   Pulse 50   Temp 36.3 C (97.3 F)   Resp (!)  21   Ht 1.626 m (_0 )   Wt 94.3 kg (208 lb)   SpO2 92%   BMI 35.70 kg/m       Gen: This is a 76 y.o.  Year-old female who is awake alert and oriented x3 in no acute distress  CV:  Regular rate and rhythm without murmurs rubs or gallops.  2+ pedal and radial pulses. No clubbing, cyanosis, or edema.  RESP:  Scattered wheezing noted throughout lung fields.  Respirations are nonlabored.  GI:  Abdomen is soft, nontender, nondistended.  Bowel sounds normoactive.  GU:  No Foley catheter is present  MSK:  Full range of motion of upper and lower extremities  NEURO:  Cranial nerves 2-12 grossly intact.  No focal deficits as tested.  SKIN: Warm, dry, intact, without lesions, rashes, or ulcerations      Diagnostic Labs:  Results for orders placed or performed during the hospital encounter of 07/02/22 (from the past 24 hour(s))   CBC/DIFF    Narrative    The following orders were created for panel order CBC/DIFF.  Procedure                               Abnormality         Status                     ---------                               -----------         ------                     CBC WITH PJKD[[id number]]                 Abnormal            Final result               MANUAL DIFFERENTIAL[[id number]]          Abnormal            Final result                 Please view results for these tests on the individual orders.   COMPREHENSIVE METABOLIC PANEL, NON-FASTING   Result Value Ref Range    SODIUM 139 136 - 145 mmol/L    POTASSIUM 4.5 3.5 - 5.1 mmol/L    CHLORIDE 104 98 - 107 mmol/L    CO2 TOTAL 27 21 - 31 mmol/L    ANION GAP 8 4 - 13 mmol/L    BUN 24 7 - 25 mg/dL    CREATININE 1.24 0.60 - 1.30 mg/dL    BUN/CREA RATIO 19 6 - 22    ESTIMATED GFR 45 (L) >59 mL/min/1.68m2    ALBUMIN 4.2 3.5 - 5.7 g/dL    CALCIUM 9.7 8.6 - 10.3 mg/dL    GLUCOSE 84 74 - 109 mg/dL    ALKALINE PHOSPHATASE 54 34 - 104 U/L    ALT (SGPT) 60 (H) 7 - 52 U/L    AST (SGOT) 37 13 - 39 U/L    BILIRUBIN TOTAL 0.3 0.3 - 1.2 mg/dL    PROTEIN TOTAL 7.2 6.4 - 8.9 g/dL    ALBUMIN/GLOBULIN RATIO 1.4 0.8 - 1.4    OSMOLALITY, CALCULATED 281 270 - 290 mOsm/kg  CALCIUM, CORRECTED 9.5 8.9 - 10.8 mg/dL    GLOBULIN 3.0 2.9 - 5.4    Narrative    Estimated Glomerular Filtration Rate (eGFR) is calculated using the CKD-EPI (2021) equation, intended for patients 86 years of age and older. If gender is not documented or "unknown", there will be no eGFR calculation.     LACTIC ACID LEVEL W/ REFLEX FOR LEVEL >2.0   Result Value Ref Range    LACTIC ACID 2.0 0.5 - 2.2 mmol/L   TROPONIN-I   Result Value Ref Range    TROPONIN I 9 <15 ng/L   URINALYSIS, MACROSCOPIC AND MICROSCOPIC W/CULTURE REFLEX    Specimen: Urine, Clean Catch    Narrative    The following orders were created for panel order URINALYSIS, MACROSCOPIC AND MICROSCOPIC W/CULTURE REFLEX.  Procedure                               Abnormality         Status                     ---------                               -----------         ------                     URINALYSIS, MACROSCOPIC[[id number]]      Normal              Final result               URINALYSIS, MICROSCOPIC[[id number]]      Abnormal            Final result                  Please view results for these tests on the individual orders.   ARTERIAL BLOOD GAS/LACTATE   Result Value Ref Range    PH (ARTERIAL) 7.41 7.35 - 7.45    PCO2 (ARTERIAL) 43 35 - 45 mm/Hg    BICARBONATE (ARTERIAL) 26.0 20.0 - 26.0 mmol/L    BASE EXCESS (ARTERIAL) 2.1 (H) 0.0 - 2.0 mmol/L    MET-HEMOGLOBIN 0.4 <=2.0 %    LACTATE 1.0 <=2.0 mmol/L    CARBOXYHEMOGLOBIN 0.8 <=1.5 %    O2CT 17.0 %    %FIO2 (ARTERIAL) 28 %    PO2 (ARTERIAL) 89 80 - 100 mm/Hg    OXYHEMOGLOBIN 95.9 88.0 - 100.0 %    ALLEN TEST yess     DRAW SITE rr    COVID-19, FLU A/B, RSV RAPID BY PCR   Result Value Ref Range    SARS-CoV-2 Not Detected Not Detected    INFLUENZA VIRUS TYPE A Not Detected Not Detected    INFLUENZA VIRUS TYPE B Not Detected Not Detected    RESPIRATORY SYNCTIAL VIRUS (RSV) Detected (A) Not Detected    Narrative    Results are for the simultaneous qualitative identification of SARS-CoV-2 (formerly 2019-nCoV), Influenza A, Influenza B, and RSV RNA. These etiologic agents are generally detectable in nasopharyngeal and nasal swabs during the ACUTE PHASE of infection. Hence, this test is intended to be performed on respiratory specimens collected from individuals with signs and symptoms of upper respiratory tract infection who meet Centers for Disease Control and Prevention (CDC) clinical and/or epidemiological criteria for Coronavirus  Disease 2019 (COVID-19) testing. CDC COVID-19 criteria for testing on human specimens is available at [facility] webpage information for Healthcare Professionals: Coronavirus Disease 2019 (COVID-19) ([email or web address]).     False-negative results may occur if the virus has genomic mutations, insertions, deletions, or rearrangements or if performed very early in the course of illness. Otherwise, negative results indicate virus specific RNA targets are not detected, however negative results do not preclude SARS-CoV-2 infection/COVID-19, Influenza, or Respiratory  syncytial virus infection. Results should not be used as the sole basis for patient management decisions. Negative results must be combined with clinical observations, patient history, and epidemiological information. If upper respiratory tract infection is still suspected based on exposure history together with other clinical findings, re-testing should be considered.    Disclaimer:   This assay has been authorized by FDA under an Emergency Use Authorization for use in laboratories certified under the Clinical Laboratory Improvement Amendments of 1988 (CLIA), 42 U.S.C. 862-113-1059, to perform high complexity tests. The impacts of vaccines, antiviral therapeutics, antibiotics, chemotherapeutic or immunosuppressant drugs have not been evaluated.     Test methodology:   Cepheid Xpert Xpress SARS-CoV-2/Flu/RSV Assay real-time polymerase chain reaction (RT-PCR) test on the GeneXpert Dx and Xpert Xpress systems.   PT/INR   Result Value Ref Range    PROTHROMBIN TIME 11.9 9.8 - 12.7 seconds    INR 1.03 0.84 - 1.10    Narrative    INR OF 2.0-3.0  RECOMMENDED FOR: PROPHYLAXIS/TREATMENT OF VENEOUS THROMBOSIS, PULMONARY EMBOLISM, PREVENTION OF SYSTEMIC EMBOLISM FROM ATRIAL FIBRILATION, MYOCARDIAL INFARCTION.    INR OF 2.5-3.5  RECOMMENDED FOR MECHANICAL PROSTHETIC HEART VALVES, RECURRENT SYSTEMIC EMBOLISM, RECURRENT MYOCARDIAL INFARCTION.     PTT (PARTIAL THROMBOPLASTIN TIME)   Result Value Ref Range    APTT 31.0 25.0 - 38.0 seconds   D-DIMER   Result Value Ref Range    D-DIMER 2,550 (HH) 215 - 500 ng/mL FEU    Narrative    D-Dimers are reported in FEU per ng/mL.    IF PATIENT IS EXHIBITING SYMPTOMS DVT/PE, THIS D-DIMER RESULT MAY INDICATE A NEED FOR FURTHER TESTING FOR THESE CONDITIONS. IF PATIENT IS SUSPECTED OF DIC AND SYMPTOMS WORSEN OR PERSIST, A REPEAT DIC WORKUP SHOULD BE CONSIDERED.    NOTE: ALTHOUGH THE NORMAL RANGE FOR THIS TEST IS 215-500 ng/mL FEU, LITERATURE RECOMMENDS FURTHER TESTING FOR ANY RESULT >500ng/mL FEU.    A  cut off value of 500ng/mL FEU or below can be used as an aid in the diagnosis of Thromboembolism when used in conjunction with the patient's medical history, clinical presentation and other findings. Results of 500ng/mL FEU or below have a negative predictive value of 100%.     MAGNESIUM   Result Value Ref Range    MAGNESIUM 1.7 (L) 1.9 - 2.7 mg/dL   CBC WITH DIFF   Result Value Ref Range    WBC 15.1 (H) 3.8 - 11.8 x10^3/uL    RBC 4.50 3.63 - 4.92 x10^6/uL    HGB 12.6 10.9 - 14.3 g/dL    HCT 38.8 31.2 - 41.9 %    MCV 86.0 75.5 - 95.3 fL    MCH 28.1 24.7 - 32.8 pg    MCHC 32.6 32.3 - 35.6 g/dL    RDW 13.2 12.3 - 17.7 %    PLATELETS 283 140 - 440 x10^3/uL    MPV 8.4 7.9 - 10.8 fL   URINALYSIS, MACROSCOPIC   Result Value Ref Range    COLOR Light Yellow Colorless, Light Yellow, Yellow  APPEARANCE Clear Clear    SPECIFIC GRAVITY 1.014 1.002 - 1.030    PH 5.5 5.0 - 9.0    LEUKOCYTES Negative Negative, 100  WBCs/uL    NITRITE Negative Negative    PROTEIN Negative Negative, 10 , 20  mg/dL    GLUCOSE Negative Negative, 30  mg/dL    KETONES Negative Negative, Trace mg/dL    BILIRUBIN Negative Negative, 0.5 mg/dL    BLOOD Negative Negative, 0.03 mg/dL    UROBILINOGEN Normal Normal mg/dL   URINALYSIS, MICROSCOPIC   Result Value Ref Range    MUCOUS Rare (A) (none) /hpf    RBCS <1 <4 /hpf    WBCS 2 <6 /hpf    SQUAMOUS EPITHELIAL 1 <28 /hpf   MANUAL DIFFERENTIAL   Result Value Ref Range    WBC 15.1 x10^3/uL    NEUTROPHIL % 67 40 - 76 %    LYMPHOCYTE % 23 (L) 25 - 45 %    MONOCYTE % 9 0 - 12 %    EOSINOPHIL %      BASOPHIL %      METAMYELOCYTE %      MYELOCYTE %      PROMYELOCYTE %      BAND % 1 (L) 5 - 11 %    BLAST %      OTHER %      NEUTROPHIL ABSOLUTE 10.27 (H) 1.80 - 8.40 x10^3/uL    LYMPHOCYTE ABSOLUTE 3.47 1.10 - 5.00 x10^3/uL    MONOCYTE ABSOLUTE 1.36 (H) 0.00 - 1.30 x10^3/uL    EOSINOPHIL ABSOLUTE      BASOPHIL ABSOLUTE      METAMYELOCYTE ABSOLUTE      MYELOCYTE ABSOLUTE      PROMYELOCYTE ABSOLUTE      BLAST ABSOLUTE       OTHER CELL ABSOLUTE      ANISOCYTOSIS 1+ (10-25%)     POLYCHROMASIA      POIKILOCYTOSIS      BASOPHILIC STIPPLING      MICROCYTOSIS      MACROCYTOSIS      ROULEAUX      SCHISTOCYTES      SPHEROCYTES      TARGET CELLS      TEARDROP CELLS      OVALOCYTE (ELLIPTOCYTE)      CRENATED RED CELLS      STOMATOCYTES      ACANTHOCYTES (SPUR CELL)      ECHINOCYTE (BURR CELL)      BLISTER CELLS      RBC AGGLUTINATES      HOWELL JOLLY BODIES      ATYPICAL LYMPHOCYTES      TOXIC GRANULATION      DOHLE BODIES      TOXIC VACUOLIZATION      AUER RODS      BASKET CELLS      HYPERSEGMENTATION      LARGE PLATELETS      PLATELET CLUMPS      WBC MORPHOLOGY COMMENT      RBC MORPHOLOGY COMMENT      PLATELET MORPHOLOGY COMMENT Normal     BANDS NEUTROPHILS MANUAL 1     BAND ABSOLUTE      NEUTROPHILS MANUAL 67     LYMPHOCYTES MANUAL 23     MONOCYTES MANUAL 9     EOSINOPHILS MANUAL      BASOPHILS MANUAL      PROMYELOCYTES MANUAL      MYELOCYTES MANUAL      METAMYELOCYTES MANUAL  BLASTS MANUAL      TOTAL CELLS COUNTED [#] IN BLOOD 100     OTHER CELLS MANUAL      NUCLEATED RBC MANUAL      PLASMA CELL %      PLASMA CELL ABSOLUE      PLASMA CELLS MANUAL      HYPOCHROMASIA         Diagnostic Imaging:  CT ANGIO CHEST FOR PULMONARY EMBOLUS W IV CONTRAST   Final Result   Positive for bilateral pulmonary emboli. Clot burden is moderate. There are no changes to suggest right-sided heart failure.         One or more dose reduction techniques were used (e.g., Automated exposure control, adjustment of the mA and/or kV according to patient size, use of iterative reconstruction technique).      A Critical Document Only message has been documented for [facility] in the [facility] system on 07/02/2022 11:29 AM, Message ID [id number].         Radiologist location ID: [id number]         XR CHEST PA AND LATERAL   Final Result   Radiographic findings suggestive of bronchiolitis particularly about the left perihilar region.          Radiologist location ID: [id number]              Diagnostic Cardiology: EKG      Assesment/Plan:  Patient Active Problem List   Diagnosis    Pulmonary embolism (CMS HCC)    Sleep apnea    Diabetes mellitus, type 2 (CMS HCC)    RSV (acute bronchiolitis due to respiratory syncytial virus)    COPD exacerbation (CMS HCC)      Pulmonary embolus   Heparin drip   May need to consider changing anticoagulation as treatment failure   Patient may need hematologist consult in the future     COPD exacerbation/RSV bronchiolitis.  Will treat the patient with Solu-Medrol, azithromycin, ceftriaxone for empiric therapy, DuoNebs,    Bradycardia   Will hold home metoprolol.  This medication may need adjusted based on telemetry.    Diabetes  Sliding scale coverage for diabetes   Diabetic diet.    Restart home medications as appropriate.  Further orders and recommendations will be based on clinical course and progress.  Dr. Earline Mayotte present, examined the patient agrees above-mentioned documentation and treatment plan.  DVT Prophylaxis: heparin drip  Diet: Diabetic    CODE: Full Code    On the day of the encounter, a total of  35 minutes was spent on this patient encounter including review of historical information, examination, documentation and post-visit activities. The time documented excludes procedural time.      Satcha Storlie, FNP-BC

## 2022-07-03 ENCOUNTER — Inpatient Hospital Stay (HOSPITAL_COMMUNITY): Payer: Medicare Other

## 2022-07-03 DIAGNOSIS — I2699 Other pulmonary embolism without acute cor pulmonale: Secondary | ICD-10-CM

## 2022-07-03 DIAGNOSIS — J440 Chronic obstructive pulmonary disease with acute lower respiratory infection: Secondary | ICD-10-CM

## 2022-07-03 DIAGNOSIS — I272 Pulmonary hypertension, unspecified: Secondary | ICD-10-CM

## 2022-07-03 DIAGNOSIS — Z86711 Personal history of pulmonary embolism: Secondary | ICD-10-CM

## 2022-07-03 DIAGNOSIS — G4730 Sleep apnea, unspecified: Secondary | ICD-10-CM

## 2022-07-03 DIAGNOSIS — J210 Acute bronchiolitis due to respiratory syncytial virus: Secondary | ICD-10-CM

## 2022-07-03 DIAGNOSIS — F329 Major depressive disorder, single episode, unspecified: Secondary | ICD-10-CM

## 2022-07-03 DIAGNOSIS — K219 Gastro-esophageal reflux disease without esophagitis: Secondary | ICD-10-CM

## 2022-07-03 DIAGNOSIS — J9610 Chronic respiratory failure, unspecified whether with hypoxia or hypercapnia: Secondary | ICD-10-CM

## 2022-07-03 DIAGNOSIS — E119 Type 2 diabetes mellitus without complications: Secondary | ICD-10-CM

## 2022-07-03 DIAGNOSIS — L97319 Non-pressure chronic ulcer of right ankle with unspecified severity: Secondary | ICD-10-CM

## 2022-07-03 DIAGNOSIS — I10 Essential (primary) hypertension: Secondary | ICD-10-CM

## 2022-07-03 LAB — PTT (PARTIAL THROMBOPLASTIN TIME)
APTT: 66.8 seconds — ABNORMAL HIGH (ref 25.0–38.0)
APTT: 73.2 seconds — ABNORMAL HIGH (ref 25.0–38.0)
APTT: 87.7 seconds — ABNORMAL HIGH (ref 25.0–38.0)

## 2022-07-03 LAB — CBC WITH DIFF
BASOPHIL #: 0.1 10*3/uL (ref 0.00–0.10)
BASOPHIL %: 1 % (ref 0–1)
EOSINOPHIL #: 0 10*3/uL (ref 0.00–0.50)
EOSINOPHIL %: 0 % — ABNORMAL LOW
HCT: 35.5 % (ref 31.2–41.9)
HGB: 11.9 g/dL (ref 10.9–14.3)
LYMPHOCYTE #: 1 10*3/uL (ref 1.00–3.00)
LYMPHOCYTE %: 11 % — ABNORMAL LOW (ref 16–44)
MCH: 28.3 pg (ref 24.7–32.8)
MCHC: 33.6 g/dL (ref 32.3–35.6)
MCV: 84.2 fL (ref 75.5–95.3)
MONOCYTE #: 0.2 10*3/uL — ABNORMAL LOW (ref 0.30–1.00)
MONOCYTE %: 2 % — ABNORMAL LOW (ref 5–13)
MPV: 8.1 fL (ref 7.9–10.8)
NEUTROPHIL #: 8.2 10*3/uL — ABNORMAL HIGH (ref 1.85–7.80)
NEUTROPHIL %: 86 % — ABNORMAL HIGH (ref 43–77)
PLATELETS: 255 10*3/uL (ref 140–440)
RBC: 4.22 10*6/uL (ref 3.63–4.92)
RDW: 12.9 % (ref 12.3–17.7)
WBC: 9.5 10*3/uL (ref 3.8–11.8)

## 2022-07-03 LAB — MAGNESIUM: MAGNESIUM: 1.8 mg/dL — ABNORMAL LOW (ref 1.9–2.7)

## 2022-07-03 LAB — BASIC METABOLIC PANEL
ANION GAP: 9 mmol/L (ref 4–13)
BUN/CREA RATIO: 18 (ref 6–22)
BUN: 21 mg/dL (ref 7–25)
CALCIUM: 9.4 mg/dL (ref 8.6–10.3)
CHLORIDE: 106 mmol/L (ref 98–107)
CO2 TOTAL: 26 mmol/L (ref 21–31)
CREATININE: 1.14 mg/dL (ref 0.60–1.30)
ESTIMATED GFR: 50 mL/min/{1.73_m2} — ABNORMAL LOW (ref >59)
GLUCOSE: 161 mg/dL — ABNORMAL HIGH (ref 74–109)
OSMOLALITY, CALCULATED: 288 mOsm/kg (ref 270–290)
POTASSIUM: 4.8 mmol/L (ref 3.5–5.1)
SODIUM: 141 mmol/L (ref 136–145)

## 2022-07-03 LAB — B-TYPE NATRIURETIC PEPTIDE (BNP),PLASMA: BNP: 240 pg/mL — ABNORMAL HIGH (ref 5–100)

## 2022-07-03 MED ORDER — ENOXAPARIN 100 MG/ML SUBCUTANEOUS SYRINGE
1.0000 mg/kg | INJECTION | Freq: Two times a day (BID) | SUBCUTANEOUS | Status: DC
Start: 2022-07-03 — End: 2022-07-04
  Administered 2022-07-03 (×2): 90 mg via SUBCUTANEOUS
  Filled 2022-07-03 (×2): qty 1

## 2022-07-03 NOTE — Progress Notes (Signed)
Kathleen Munoz    HOSPITALIST PROGRESS NOTE    Kathleen Munoz  Date of service: 07/03/2022  Date of Admission:  07/02/2022  Hospital Day:  LOS: 1 day     Subjective:   The patient states she still has SOB and productive cough but sx improved. No chest pain or dizziness. Has wound R ankle that dressing is supposed to be removed tomorrow (had skin graft).     Assessment/ Plan:     76yo F with hx of nonhealing ulcer of R ankle, hx of PE on Eliquis, HTN, chronic respiratory failure on 2L who presented with increasin gSOB.     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 Most recently with segmental PE's in all lobes on 5/23 started on Eliquis. Re-presented to the ED in 11/23 with SOB, CT w/o large PE but equivocal for PA filling defects. CTA this admission with bilateral PE's with moderate clot burdern (segmental). No e/o RHS. Patient requirin 3L from 2L baseline. Last echo in 5/23 with mod PHTN. Trop negative.  -BNP  -Bilateral venous duplex LE  -Stop heparin gtt and transition to Lovenox  -Appreciate heme/onc consult for Eliquis treatment failure  -pt is also on aspirin for unknown reasons     #RSV  #Aon CHRF  On 3L from baseline 2.   -duonebs, steroids  -low suspicion for bacterial PNA, stop abx  -procal    #RLE wound  Does not appear infected  -RN to re-wrap    CHRONIC ISSUES  #DM2  #MDD-cont home meds  #HTN-lisinopril, hctz  #GERD-ppi    #DVT PPX: full dose lovenox  Disposition Planning:  likely home tomorrow      Active Hospital Problems   (*Primary Problem)    Diagnosis    *Pulmonary embolism (CMS HCC)    RSV (acute bronchiolitis due to respiratory syncytial virus)    COPD exacerbation (CMS HCC)    Sleep apnea     Chronic     CPAP      Diabetes mellitus, type 2 (CMS HCC)     Chronic      ---------------------------------------------------------------------------------------------------------------------------------------------------------------------------  Review of Systems  Other than ROS in the HPI, all other systems were negative.    Vital Signs:  Filed Vitals:    07/03/22 0430 07/03/22 1016 07/03/22 1020 07/03/22 1327   BP: (!) 152/71      Pulse: 68 57     Resp: 20      Temp: 36.6 C (97.8 F)      SpO2: 92%  93% 92%        Physical Exam:  General:  Patient in NAD, resting in bed  Head:  Normocephalic, atraumatic  Eyes:  PERRL, anicteric sclera  ENT:  Oral mucosa moist, no nasal discharge   Neck:  Soft, supple, trachea midline  Heart:  RRR, S1 and S2 normal  Lungs:  Unlabored respirations.  Trace wheeze, decreased breath sounds  Abdomen:  Soft, active bowel sounds, non-tender to palpation, non-distended  Extremities:  No edema in lower extremities bilaterally   Skin:  wound/uleration on R lateral ankle, no erythema or edema, no purulence  Neuro:  A&O x 3.  No focal deficits.  Speech intact  Psych:  Cooperative, not agitated    Intake & Output:    Intake/Output Summary (Last 24 hours) at 07/03/2022 1359  Last data filed at 07/02/2022 1628  Gross per 24 hour   Intake  305 ml   Output --   Net 305 ml     I/O current shift:  No intake/output data recorded.  Emesis:    BM:    Date of Last Bowel Movement: 07/02/22  Heme:      acetaminophen (TYLENOL) tablet, 650 mg, Oral, Q4H PRN  aspirin chewable tablet 81 mg, 81 mg, Oral, Daily  budesonide (PULMICORT RESPULES) 0.5 mg/2 mL nebulizer suspension, 0.5 mg, Nebulization, 2x/day  buPROPion (WELLBUTRIN SR) sustained release tablet, 150 mg, Oral, 2x/day  busPIRone (BUSPAR) tablet, 10 mg, Oral, 3x/day  citalopram (CeleXA) tablet, 40 mg, Oral, Daily  clonazePAM (klonoPIN) tablet, 1 mg, Oral, 3x/day  enoxaparin PF (LOVENOX) 100 mg/mL SubQ injection, 1 mg/kg, Subcutaneous, Q12H  gabapentin (NEURONTIN) capsule, 300 mg, Oral, 3x/day  lisinopril (PRINIVIL)  tablet, 20 mg, Oral, Daily   And  hydroCHLOROthiazide (HYDRODIURIL) tablet, 12.5 mg, Oral, Daily  ipratropium-albuterol 0.5 mg-3 mg(2.5 mg base)/3 mL Solution for Nebulization, 3 mL, Nebulization, 4x/day  levothyroxine (SYNTHROID) tablet, 88 mcg, Oral, QAM  loratadine (CLARITIN) tablet, 10 mg, Oral, Daily  methylPREDNISolone sod succ (SOLU-medrol) 125 mg/2 mL injection, 62.5 mg, Intravenous, Q8H  pantoprazole (PROTONIX) delayed release tablet, 40 mg, Oral, Daily  traZODone (DESYREL) tablet, 200 mg, Oral, NIGHTLY          Labs:  Recent Results (from the past 48 hour(s))   CBC WITH DIFF    Collection Time: 07/03/22  1:35 AM   Result Value    WBC 9.5    HGB 11.9    HCT 35.5    PLATELETS 255      Results for orders placed or performed during the hospital encounter of 07/02/22 (from the past 48 hour(s))   BASIC METABOLIC PANEL, NON-FASTING    Collection Time: 07/03/22  1:35 AM   Result Value    SODIUM 141    POTASSIUM 4.8    CHLORIDE 106    CO2 TOTAL 26    GLUCOSE 161 (H)    BUN 21    CREATININE 1.14      Recent Results (from the past 48 hour(s))   COMPREHENSIVE METABOLIC PANEL, NON-FASTING    Collection Time: 07/02/22 10:00 AM   Result Value    ALKALINE PHOSPHATASE 54    ALT (SGPT) 60 (H)    AST (SGOT) 37      Results for orders placed or performed during the hospital encounter of 07/02/22 (from the past 48 hour(s))   B-TYPE NATRIURETIC PEPTIDE    Collection Time: 07/03/22  9:45 AM   Result Value    BNP 240 (H)   TROPONIN-I    Collection Time: 07/02/22 10:00 AM   Result Value    TROPONIN I 9      Recent Results (from the past 48 hour(s))   PTT (PARTIAL THROMBOPLASTIN TIME) - ONCE    Collection Time: 07/03/22  7:59 AM   Result Value    APTT 66.8 (H)   D-DIMER    Collection Time: 07/02/22 10:00 AM   Result Value    D-DIMER 2,550 (HH)   PT/INR    Collection Time: 07/02/22 10:00 AM   Result Value    PROTHROMBIN TIME 11.9    INR 1.03      No results found for this or any previous visit (from the past 1344 hour(s)).   Results  for orders placed or performed during the hospital encounter of 07/02/22 (from the past 48 hour(s))   ARTERIAL BLOOD GAS/LACTATE    Collection Time: 07/02/22  9:58 AM   Result Value    LACTATE 1.0        Microbiology:  Hospital Encounter on 07/02/22 (from the past 96 hour(s))   ADULT ROUTINE BLOOD CULTURE, SET OF 2 ADULT BOTTLES (BACTERIA AND YEAST)    Collection Time: 07/02/22 10:00 AM    Specimen: Blood   Culture Result Status    BLOOD CULTURE, ROUTINE No Growth 18-24 hrs. Preliminary   ADULT ROUTINE BLOOD CULTURE, SET OF 2 ADULT BOTTLES (BACTERIA AND YEAST)    Collection Time: 07/02/22 10:01 AM    Specimen: Blood   Culture Result Status    BLOOD CULTURE, ROUTINE No Growth 18-24 hrs. Preliminary       Imaging:   CT ANGIO CHEST FOR PULMONARY EMBOLUS W IV CONTRAST  Narrative: Shephanie S Sawdey    RADIOLOGIST: Berton Bon, MD    CT ANGIO CHEST FOR PULMONARY EMBOLUS W IV CONTRAST performed on 07/02/2022 11:10 AM    CLINICAL HISTORY: SHORTNESS OF BREATH, HISTORY OF PULMONARY EMBOLI.  SHORTNESS OF BREATH, HISTORY OF PULMONARY EMBOLI, positive for rsv    TECHNIQUE: CTA imaging of the chest with intravenous contrast.  3D reconstructions.  CONTRAST:  80 ml's of Omnipaque 350    COMPARISON: 06/17/2022  # of known CTs in the past 12 months:  2   # of known Cardiac Nuclear Medicine Studies in the past 12 months:  0         FINDINGS:  Hardware:  None.    Lymph nodes:   No mediastinal, hilar, or axillary lymphadenopathy.    Heart:  Coronary artery calcifications are noted.        RV/LV Diameter Ratio: Less than 1    Thoracic Aorta:  Contrast timing is designed to evaluate the pulmonary arteries.  The thoracic aorta has a normal contour.  Contrast opacification of the aorta is suboptimal for evaluation of the lumen.    Pulmonary Vessels:  There are bilateral filling defects involving the distal right and left pulmonary arteries, with segmental filling defects bilaterally as well. Clot burden is moderate    Lungs and Airways:  There  are mild chronic changes at the lung apices. There is left basilar discoid atelectasis or scarring    Pleura: No pleural effusion.  No pneumothorax.    Upper Abdomen: Visualized portions of the upper abdominal viscera are unremarkable.    Bones: Degenerative changes of the thoracic spine.  Impression: Positive for bilateral pulmonary emboli. Clot burden is moderate. There are no changes to suggest right-sided heart failure.    One or more dose reduction techniques were used (e.g., Automated exposure control, adjustment of the mA and/or kV according to patient size, use of iterative reconstruction technique).    A Critical Document Only message has been documented for WESTON CHILDERS in the [facility] Findings system on 07/02/2022 11:29 AM, Message ID [id number].    Radiologist location ID: [id number]  XR CHEST PA AND LATERAL  Narrative: Debbera S Bernard    RADIOLOGIST: Karna Dupes    XR CHEST PA AND LATERAL performed on 07/02/2022 10:40 AM    CLINICAL HISTORY: COUGH, SHORTNESS OF BREATH.  pt c/o sob and low HR    TECHNIQUE: Frontal and lateral views of the chest.    COMPARISON:  10/11/2021    FINDINGS:  The patient is slightly rotated to the left.    Faint left perihilar interstitial opacities with a few areas of peribronchial thickening are present which may be related to bronchiolitis-airways disease.  No consolidative airspace disease is identified. No pleural effusion or pneumothorax is seen.  Cardiac silhouette remains normal in size. Lungs remain symmetrically inflated. Trachea remains at midline.  Impression: Radiographic findings suggestive of bronchiolitis particularly about the left perihilar region.    Radiologist location ID: [id number]  ECG 12 LEAD  Sinus bradycardia  Incomplete right bundle branch block  Nonspecific T wave abnormality  Abnormal ECG  When compared with ECG of 17-Nov-2021 10:54,  Vent. rate has decreased BY  33 BPM  Incomplete right bundle branch block is now  present  Criteria for Anterior infarct are no longer present        Luci Bank, DO  07/03/2022  [place] HOSPITALIST

## 2022-07-03 NOTE — Care Plan (Signed)
Patient admitted with RSV and bilateral pulmonary emboli. Patient on heparin drip at 13 units/kg/hour per thrombotic protocol. Wearing nasal cannula at 3L. Vital signs stable. Patient alert and oriented. Education ongoing    Problem: Adult Inpatient Plan of Care  Goal: Patient-Specific Goal (Individualized)  Outcome: Ongoing (see interventions/notes)  Flowsheets (Taken 07/02/2022 1940)  Individualized Care Needs: Monitor breathing, vitals  Anxieties, Fears or Concerns: anxious about dyspnea/wheezing and what she's going to do about christmas  Patient-Specific Goals (Include Timeframe): discharge home for christmas

## 2022-07-03 NOTE — Consults (Signed)
[facility]  Palliative Care Nurse Practitioner  Consult Note    Kathleen GammonCarver, Kathleen Munoz, 76 y.o. female  Date of Birth:  1945/12/26  MRN: [id number]  Admit Date: 07/02/2022   Attending: Hospitalist  Madolyn Friezeonald W Billips, MD   Reason for Consult: Goal Coordination    Chief Complaint:  [name] is a 76 year old female consulted palliative care services for symptom management of chronic disease processes.    HPI:  Kathleen Gammonatricia Munoz Chamberland is a 76 y.o. female who presented to the emergency department with increased shortness of breath.  The patient states that she had been feeling sick for approximately 7 days prior to arrival to the ED with increased shortness breath and thick yellow productive sputum.  The patient does have a known history of multiple PEs in the past and has been on Eliquis for this over the last year.  The patient reports that she has been taking her Eliquis compliantly with no difficulty.  She reported in the ED that she was very tired and dyspneic on exertion.  She went to see her primary care provider and was treated for bronchitis with azithromycin and prednisone however her symptoms did not improve.  CT scan of the chest revealed that the patient had pulmonary embolism without evidence of right heart strain.  The patient has been admitted with pulmonary embolism, sleep apnea, RSV, and chronic disease management.  She has been treated with a heparin drip and is now on subcutaneous blood thinners.  For COPD exacerbation and RSV she has been treated with Solu-Medrol, azithromycin, and ceftriaxone for empiric therapy also DuoNebs.      Subjective:  The patient is resting in bed with no acute distress noted      Review of Systems:  ROS: Other than ROS in the HPI, all other systems were negative.    Past Medical History:   Diagnosis Date    Anxiety     Arthritis     Cellulitis and abscess of right lower extremity     Depression     Diabetes mellitus, type 2 (CMS HCC)     Esophageal reflux     H/O  blood clots     LUNGS    Hypertension     Major depressive disorder     Personal history of fall     Shortness of breath     Sleep apnea     CPAP    Thyroid disease          Past Surgical History:   Procedure Laterality Date    HAND SURGERY Left     HX BUNIONECTOMY      HX TUBAL LIGATION            Family Medical History:       Problem Relation (Age of Onset)    Heart Attack Mother    Stomach Cancer Father            Social Determinants of Health     Financial Resource Strain: No   Transportation Needs: None   Social Connections: Family interaction   Intimate Partner Violence: None   Housing Stability: Has housing     Social History     Socioeconomic History    Marital status: Widowed   Tobacco Use    Smoking status: Former     Packs/day: 1.00     Years: 15.00     Additional pack years: 0.00     Total pack years: 15.00  Types: Cigarettes     Quit date: 15     Years since quitting: 30.9     Passive exposure: Past    Smokeless tobacco: Never   Vaping Use    Vaping Use: Never used   Substance and Sexual Activity    Alcohol use: Not Currently    Drug use: Never    Sexual activity: Not Currently     Partners: Male     Social Determinants of Health     Social Connections: Medium Risk (07/02/2022)    Social Connections     SDOH Social Isolation: 3 to 5 times a week       Current Outpatient Medications   Medication Instructions    albuterol sulfate (PROVENTIL OR VENTOLIN OR PROAIR) 90 mcg/actuation Inhalation oral inhaler 1-2 Puffs, Inhalation, EVERY 6 HOURS PRN    apixaban (ELIQUIS) 5 mg, Oral, 2 TIMES DAILY    aspirin 81 mg, Oral, DAILY    buPROPion (WELLBUTRIN SR) 150 mg, Oral, 2 TIMES DAILY WITH BREAKFAST & LUNCH    busPIRone (BUSPAR) 10 mg, Oral, 3 TIMES DAILY    citalopram (CELEXA) 40 mg, Oral, DAILY    clonazePAM (KLONOPIN) 1 mg, Oral, 3 TIMES DAILY    fexofenadine (ALLEGRA) 180 mg, Oral, DAILY    gabapentin (NEURONTIN) 300 mg, Oral, 3 TIMES DAILY    ipratropium bromide (ATROVENT) 21 mcg (0.03 %) Nasal nasal  spray 2 Sprays, Nasal, 2 TIMES DAILY PRN    levothyroxine (SYNTHROID) 88 mcg, Oral, EVERY MORNING    lisinopriL-hydrochlorothiazide (ZESTORETIC) 20-12.5 mg Oral Tablet 1 Tablet, Oral, DAILY    metoprolol succinate (TOPROL-XL) 25 mg, Oral, DAILY    multivitamin Oral Capsule 1 Capsule, Oral, DAILY    omeprazole (PRILOSEC) 40 mg, Oral, 2 TIMES DAILY    SITagliptin phosphate (JANUVIA) 100 mg, Oral, DAILY    traZODone (DESYREL) 200 mg, Oral, NIGHTLY      Allergies   Allergen Reactions    Influenza Virus Vaccines         Physical Exam:  Constitutional: no distress  Respiratory:  Mild wheezing and diminished  Cardiovascular: regular rate and rhythm  Gastrointestinal: Soft, non-tender, Bowel sounds normal, non-distended  Extremities:  No edema noted.  Dime sized ulceration to right ankle  Integumentary:  Skin warm and dry and dime-sized ulceration to right ankle  Neurologic: Alert and oriented x3    BP (!) 152/71   Pulse 57   Temp 36.6 C (97.8 F)   Resp 20   Ht 1.626 m (5\' 4" )   Wt 94.1 kg (207 lb 7.3 oz)   SpO2 93%   BMI 35.61 kg/m        Pain: Numeric 1     Labs:  Lab Results Today:    Results for orders placed or performed during the hospital encounter of 07/02/22 (from the past 24 hour(Munoz))   PTT (PARTIAL THROMBOPLASTIN TIME)   Result Value Ref Range    APTT 129.5 (HH) 25.0 - 38.0 seconds   PTT (PARTIAL THROMBOPLASTIN TIME)   Result Value Ref Range    APTT 73.2 (H) 25.0 - 38.0 seconds   BASIC METABOLIC PANEL, NON-FASTING   Result Value Ref Range    SODIUM 141 136 - 145 mmol/L    POTASSIUM 4.8 3.5 - 5.1 mmol/L    CHLORIDE 106 98 - 107 mmol/L    CO2 TOTAL 26 21 - 31 mmol/L    ANION GAP 9 4 - 13 mmol/L    CALCIUM 9.4 8.6 -  10.3 mg/dL    GLUCOSE 741 (H) 74 - 109 mg/dL    BUN 21 7 - 25 mg/dL    CREATININE 6.38 4.53 - 1.30 mg/dL    BUN/CREA RATIO 18 6 - 22    ESTIMATED GFR 50 (L) >59 mL/min/1.51m^2    OSMOLALITY, CALCULATED 288 270 - 290 mOsm/kg   MAGNESIUM   Result Value Ref Range    MAGNESIUM 1.8 (L) 1.9 - 2.7  mg/dL   CBC WITH DIFF   Result Value Ref Range    WBC 9.5 3.8 - 11.8 x10^3/uL    RBC 4.22 3.63 - 4.92 x10^6/uL    HGB 11.9 10.9 - 14.3 g/dL    HCT 64.6 80.3 - 21.2 %    MCV 84.2 75.5 - 95.3 fL    MCH 28.3 24.7 - 32.8 pg    MCHC 33.6 32.3 - 35.6 g/dL    RDW 24.8 25.0 - 03.7 %    PLATELETS 255 140 - 440 x10^3/uL    MPV 8.1 7.9 - 10.8 fL    NEUTROPHIL % 86 (H) 43 - 77 %    LYMPHOCYTE % 11 (L) 16 - 44 %    MONOCYTE % 2 (L) 5 - 13 %    EOSINOPHIL % 0 (L) %    BASOPHIL % 1 0 - 1 %    NEUTROPHIL # 8.20 (H) 1.85 - 7.80 x10^3/uL    LYMPHOCYTE # 1.00 1.00 - 3.00 x10^3/uL    MONOCYTE # 0.20 (L) 0.30 - 1.00 x10^3/uL    EOSINOPHIL # 0.00 0.00 - 0.50 x10^3/uL    BASOPHIL # 0.10 0.00 - 0.10 x10^3/uL   PTT (PARTIAL THROMBOPLASTIN TIME)   Result Value Ref Range    APTT 87.7 (H) 25.0 - 38.0 seconds   PTT (PARTIAL THROMBOPLASTIN TIME) - ONCE   Result Value Ref Range    APTT 66.8 (H) 25.0 - 38.0 seconds   B-TYPE NATRIURETIC PEPTIDE   Result Value Ref Range    BNP 240 (H) 5 - 100 pg/mL       Imaging Studies:  CT ANGIO CHEST FOR PULMONARY EMBOLUS W IV CONTRAST    Result Date: 07/02/2022  Impression Positive for bilateral pulmonary emboli. Clot burden is moderate. There are no changes to suggest right-sided heart failure. One or more dose reduction techniques were used (e.g., Automated exposure control, adjustment of the mA and/or kV according to patient size, use of iterative reconstruction technique). A Critical Document Only message has been documented for [facility] CHILDERS in the [employer] Actionable Findings system on 07/02/2022 11:29 AM, Message ID [id number]. Radiologist location ID: [id number]     XR CHEST PA AND LATERAL    Result Date: 07/02/2022  Impression Radiographic findings suggestive of bronchiolitis particularly about the left perihilar region. Radiologist location ID: [id number]    Images and Reports reviewed to current date.    Care collaborated with the following providers:  Provider notes, labs, imaging  reviewed    ACP/GOC:  Ms. Saathoff reports that she lives in her home by herself.  She does have a son and a daughter but she states "they have families and jobs of their own".  The patient reports that up until the last few weeks she has been able to do everything for herself independently without difficulty including driving.  She has developed some pain to the area on her right ankle which has decreased her ability to drive as freely as she wants was.  She also states that she has  about 8-9 steps that are outside of her home which has also limited her ability to come and go as she was before.  The patient states that at this time she has only been leaving her home together doctor'Munoz appointments and to church on Sundays.  She does use supplemental oxygen at home and has a Rollator to assist with mobility.  She reports that she does not have additional help or support with needs.  The patient would likely benefit from home health services as well as home physical therapy.  The role and purpose of palliative care was discussed at length during the visit today.  The patient agreed that she would benefit from palliative care services on discharge.  She confirms that she wishes to be a full code with aggressive interventions at this time and becomes tearful when discussing her future mortality.  Chronic disease education provided with verbalized understanding.  No additional needs noted at this      PPS prior to hospitalization: 60%    PPS currently: 50%      Persons present and participating in discussion: Patient      Was the conversation voluntary? Yes      Plan:  outpatient palliative care      Patient Disposition/Discharge needs:  Home      ACP/GOC time:     16 minutes    Total visit time:     40  minutes including ACP/GOC time, Face to face and reviewing medical records.    Thank you for this consult.    Jayelyn Barno , NP-C  Palliative Care Nurse Practitioner    This note may have been partially generated  using [employer] Direct system and there may be some incorrect words, spellings, and punctuation that were not noted in checking the note before saving.

## 2022-07-03 NOTE — Progress Notes (Signed)
[place]  Hematology/Oncology Consult     Date of Service:  07/03/2022  Kathleen Munoz   76 y.o. female  Date of Admission:  07/02/2022  Date of Birth:  10-23-1945    REQUESTING MD:  Maxine Glenn, MD    REASON FOR CONSULT:     INFORMATION OBTAINED FROM:  Patient and/or medical record    HISTORY OF PRESENT ILLNESS:  Kathleen Munoz is a 76 y.o., White female admitted with increasing shortness of breath found to have COPD exacerbation and RSV bronchiolitis and recurrent PE.      She reports she has been sick for approximately last 7 days with shortness a breath, yellow thick productive cough.  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  She states compliance with Apixaban.      ER workup on 07/02/22 showed pulmonary embolus without evidence of right heart strain on CT scan. Apixaban was held and started on IV Heparin.    She denies chest pain, fever, chills.     HEMATOLOGY/MEDICAL ONCOLOGY HISTORY:   Diagnosed with bilateral PE in 2022 after travel initially treated with Apixaban x 9 months then had recurrent PE off of Apixaban in 11/2021 treated with IV Heparin switched to Apixaban.    A CTA thorax on 11/17/21 showed segmental pulmonary emboli have developed in all 5 lung lobes. A CTA Thorax on 06/17/22 showed improved PE with only equivocal PA filling defects in the right middle lobe and left upper lobe branches. Another CTA Thorax on 07/02/22 showed new bilateral filling defects involving the distal right and left pulmonary arteries, with segmental filling defects bilaterally as well. Clot burden is moderate.    REVIEW OF SYSTEMS:  As discussed in HPI.    PAST MEDICAL HISTORY:  Past Medical History:   Diagnosis Date    Anxiety     Arthritis     Cellulitis and abscess of right lower extremity     Depression     Diabetes mellitus, type 2 (CMS HCC)     Esophageal reflux     H/O blood clots     LUNGS     Hypertension     Major depressive disorder     Personal history of fall     Shortness of breath     Sleep apnea     CPAP    Thyroid disease      PAST SURGICAL HISTORY:  Past Surgical History:   Procedure Laterality Date    HAND SURGERY Left     HX BUNIONECTOMY      HX TUBAL LIGATION       FAMILY HISTORY:  Family Medical History:       Problem Relation (Age of Onset)    Heart Attack Mother    Stomach Cancer Father          SOCIAL HISTORY:  Social History     Socioeconomic History    Marital status: Widowed   Tobacco Use    Smoking status: Former     Packs/day: 1.00     Years: 15.00     Additional pack years: 0.00     Total pack years: 15.00     Types: Cigarettes     Quit date: 39     Years since quitting: 30.9     Passive exposure: Past    Smokeless tobacco: Never   Vaping Use  Vaping Use: Never used   Substance and Sexual Activity    Alcohol use: Not Currently    Drug use: Never    Sexual activity: Not Currently     Partners: Male     Social Determinants of Health     Social Connections: Medium Risk (07/02/2022)    Social Connections     SDOH Social Isolation: 3 to 5 times a week      ALLERGIES:   Allergies   Allergen Reactions    Influenza Virus Vaccines      CURRENT MEDICATIONS:       DC,Completed & Canceled Medications   (27h ago, onward)                 Ordered        07/02/22 1255  lisinopril-hydrochlorothiazide (ZESTORETIC) 20-12.32m per tablet  DAILY,   Status:  Discontinued            07/02/22 1255  azithromycin (ZITHROMAX) 500 mg in NS 250 mL IVPB  EVERY 24 HOURS,   Status:  Discontinued            07/02/22 1255  cefTRIAXone (ROCEPHIN) 2 g in NS 50 mL IVPB minibag  EVERY 24 HOURS,   Status:  Discontinued            07/02/22 1147  heparin 5,000 units/mL initial IV BOLUS  (HEPARIN THROMBOTIC PROTOCOL)  ONCE            07/02/22 1147  heparin 25,000 units in D5W 250 mL infusion  (HEPARIN THROMBOTIC PROTOCOL)  CONTINUOUS,   Status:  Discontinued            07/02/22 1112  iohexol (OMNIPAQUE 350) infusion   GIVE IN RADIOLOGY            07/02/22 1030  magnesium oxide (MAG-OX) 4061m(24038mlemental magnesium) tablet  NOW            07/02/22 1059  ipratropium-albuterol 0.5 mg-3 mg(2.5 mg base)/3 mL Solution for Nebulization  NOW                           Current Outpatient Medications   Medication Instructions    albuterol sulfate (PROVENTIL OR VENTOLIN OR PROAIR) 90 mcg/actuation Inhalation oral inhaler 1-2 Puffs, Inhalation, EVERY 6 HOURS PRN    apixaban (ELIQUIS) 5 mg, Oral, 2 TIMES DAILY    aspirin 81 mg, Oral, DAILY    buPROPion (WELLBUTRIN SR) 150 mg, Oral, 2 TIMES DAILY WITH BREAKFAST & LUNCH    busPIRone (BUSPAR) 10 mg, Oral, 3 TIMES DAILY    citalopram (CELEXA) 40 mg, Oral, DAILY    clonazePAM (KLONOPIN) 1 mg, Oral, 3 TIMES DAILY    fexofenadine (ALLEGRA) 180 mg, Oral, DAILY    gabapentin (NEURONTIN) 300 mg, Oral, 3 TIMES DAILY    ipratropium bromide (ATROVENT) 21 mcg (0.03 %) Nasal nasal spray 2 Sprays, Nasal, 2 TIMES DAILY PRN    levothyroxine (SYNTHROID) 88 mcg, Oral, EVERY MORNING    lisinopriL-hydrochlorothiazide (ZESTORETIC) 20-12.5 mg Oral Tablet 1 Tablet, Oral, DAILY    metoprolol succinate (TOPROL-XL) 25 mg, Oral, DAILY    multivitamin Oral Capsule 1 Capsule, Oral, DAILY    omeprazole (PRILOSEC) 40 mg, Oral, 2 TIMES DAILY    SITagliptin phosphate (JANUVIA) 100 mg, Oral, DAILY    traZODone (DESYREL) 200 mg, Oral, NIGHTLY      PHYSICAL EXAMINATION:  Patient Vitals for the past 24 hrs:   BP  Temp Pulse Resp SpO2 Weight   07/03/22 1020 -- -- -- -- 93 % --   07/03/22 1016 -- -- 57 -- -- --   07/03/22 0600 -- -- -- -- -- 94.1 kg (207 lb 7.3 oz)   07/03/22 0430 (!) 152/71 36.6 C (97.8 F) 68 20 92 % --   07/02/22 2312 (!) 125/49 36.9 C (98.5 F) 58 20 93 % --   07/02/22 2116 -- -- 60 -- -- --   07/02/22 2047 -- -- -- -- 93 % --   07/02/22 2005 (!) 161/69 36.7 C (98.1 F) 63 20 92 % 94.1 kg (207 lb 7.3 oz)   07/02/22 1927 -- -- 57 -- -- --   07/02/22 1900 (!) 141/67 -- 56 14 94 % --   07/02/22 1845 (!) 130/59  -- 56 20 95 % --   07/02/22 1815 135/70 36.4 C (97.5 F) (!) 47 17 93 % --   07/02/22 1632 -- -- -- (!) 21 (!) 88 % --   07/02/22 1615 127/64 -- (!) 42 19 95 % --   07/02/22 1600 (!) 149/63 -- 50 (!) 21 92 % --   07/02/22 1545 (!) 146/85 -- 51 (!) 21 98 % --   07/02/22 1530 131/62 -- (!) 46 (!) 21 96 % --   07/02/22 1430 139/80 -- 56 (!) 22 93 % --   07/02/22 1415 -- -- (!) 44 (!) 22 94 % --   07/02/22 1400 -- -- 68 18 (!) 89 % --   07/02/22 1340 -- -- -- 15 94 % --      ECOG Status: 1  Physical Exam  Constitutional:       Appearance: Normal appearance.   HENT:      Head: Normocephalic and atraumatic.      Nose: Nose normal.      Mouth/Throat:      Mouth: Mucous membranes are moist.      Pharynx: Oropharynx is clear.   Cardiovascular:      Rate and Rhythm: Normal rate and regular rhythm.      Pulses: Normal pulses.      Heart sounds: Normal heart sounds.   Pulmonary:      Effort: Pulmonary effort is normal.      Breath sounds: Normal breath sounds.   Abdominal:      General: Bowel sounds are normal.      Palpations: Abdomen is soft.   Musculoskeletal:         General: Normal range of motion.      Cervical back: Normal range of motion and neck supple.   Skin:     General: Skin is warm and dry.   Neurological:      General: No focal deficit present.      Mental Status: She is alert and oriented to person, place, and time.   Psychiatric:         Mood and Affect: Mood normal.         Behavior: Behavior normal.        LABORATORY DATA:  Results for orders placed or performed during the hospital encounter of 07/02/22 (from the past 24 hour(s))   PTT (PARTIAL THROMBOPLASTIN TIME)   Result Value Ref Range    APTT 129.5 (HH) 25.0 - 38.0 seconds   PTT (PARTIAL THROMBOPLASTIN TIME)   Result Value Ref Range    APTT 73.2 (H) 25.0 - 38.0 seconds   BASIC METABOLIC PANEL, NON-FASTING   Result  Value Ref Range    SODIUM 141 136 - 145 mmol/L    POTASSIUM 4.8 3.5 - 5.1 mmol/L    CHLORIDE 106 98 - 107 mmol/L    CO2 TOTAL 26 21 - 31  mmol/L    ANION GAP 9 4 - 13 mmol/L    CALCIUM 9.4 8.6 - 10.3 mg/dL    GLUCOSE 161 (H) 74 - 109 mg/dL    BUN 21 7 - 25 mg/dL    CREATININE 1.14 0.60 - 1.30 mg/dL    BUN/CREA RATIO 18 6 - 22    ESTIMATED GFR 50 (L) >59 mL/min/1.19m2    OSMOLALITY, CALCULATED 288 270 - 290 mOsm/kg   MAGNESIUM   Result Value Ref Range    MAGNESIUM 1.8 (L) 1.9 - 2.7 mg/dL   CBC WITH DIFF   Result Value Ref Range    WBC 9.5 3.8 - 11.8 x10^3/uL    RBC 4.22 3.63 - 4.92 x10^6/uL    HGB 11.9 10.9 - 14.3 g/dL    HCT 35.5 31.2 - 41.9 %    MCV 84.2 75.5 - 95.3 fL    MCH 28.3 24.7 - 32.8 pg    MCHC 33.6 32.3 - 35.6 g/dL    RDW 12.9 12.3 - 17.7 %    PLATELETS 255 140 - 440 x10^3/uL    MPV 8.1 7.9 - 10.8 fL    NEUTROPHIL % 86 (H) 43 - 77 %    LYMPHOCYTE % 11 (L) 16 - 44 %    MONOCYTE % 2 (L) 5 - 13 %    EOSINOPHIL % 0 (L) %    BASOPHIL % 1 0 - 1 %    NEUTROPHIL # 8.20 (H) 1.85 - 7.80 x10^3/uL    LYMPHOCYTE # 1.00 1.00 - 3.00 x10^3/uL    MONOCYTE # 0.20 (L) 0.30 - 1.00 x10^3/uL    EOSINOPHIL # 0.00 0.00 - 0.50 x10^3/uL    BASOPHIL # 0.10 0.00 - 0.10 x10^3/uL   PTT (PARTIAL THROMBOPLASTIN TIME)   Result Value Ref Range    APTT 87.7 (H) 25.0 - 38.0 seconds   PTT (PARTIAL THROMBOPLASTIN TIME) - ONCE   Result Value Ref Range    APTT 66.8 (H) 25.0 - 38.0 seconds   B-TYPE NATRIURETIC PEPTIDE   Result Value Ref Range    BNP 240 (H) 5 - 100 pg/mL     IMAGING STUDIES:    CT ANGIO CHEST FOR PULMONARY EMBOLUS W IV CONTRAST   Final Result by Edi, Radresults In (12/20 1133)   Positive for bilateral pulmonary emboli. Clot burden is moderate. There are no changes to suggest right-sided heart failure.         One or more dose reduction techniques were used (e.g., Automated exposure control, adjustment of the mA and/or kV according to patient size, use of iterative reconstruction technique).      A Critical Document Only message has been documented for WESTON CHILDERS in the [facility] system on 07/02/2022 11:29 AM, Message ID [id number]          Radiologist location ID: [id number]        XR CHEST PA AND LATERAL   Final Result by Edi, Radresults In (12/20 1048)   Radiographic findings suggestive of bronchiolitis particularly about the left perihilar region.         Radiologist location ID: [id number]           Results for orders placed or performed during the hospital encounter  of 07/02/22   XR CHEST PA AND LATERAL     Status: None    Narrative    Bailey S Gadsby    RADIOLOGIST: Karna Dupes    XR CHEST PA AND LATERAL performed on 07/02/2022 10:40 AM    CLINICAL HISTORY: COUGH, SHORTNESS OF BREATH.  pt c/o sob and low HR    TECHNIQUE: Frontal and lateral views of the chest.    COMPARISON:  10/11/2021    FINDINGS:  The patient is slightly rotated to the left.    Faint left perihilar interstitial opacities with a few areas of peribronchial thickening are present which may be related to bronchiolitis-airways disease.     No consolidative airspace disease is identified. No pleural effusion or pneumothorax is seen.  Cardiac silhouette remains normal in size. Lungs remain symmetrically inflated. Trachea remains at midline.      Impression    Radiographic findings suggestive of bronchiolitis particularly about the left perihilar region.      Radiologist location ID: [id number]     CT ANGIO CHEST FOR PULMONARY EMBOLUS W IV CONTRAST     Status: None    Narrative    Ezmeralda S Mirabile    RADIOLOGIST: Berton Bon, MD    CT ANGIO CHEST FOR PULMONARY EMBOLUS W IV CONTRAST performed on 07/02/2022 11:10 AM    CLINICAL HISTORY: SHORTNESS OF BREATH, HISTORY OF PULMONARY EMBOLI.  SHORTNESS OF BREATH, HISTORY OF PULMONARY EMBOLI, positive for rsv    TECHNIQUE: CTA imaging of the chest with intravenous contrast.  3D reconstructions.  CONTRAST:  80 ml's of Omnipaque 350    COMPARISON: 06/17/2022  # of known CTs in the past 12 months:  2   # of known Cardiac Nuclear Medicine Studies in the past 12 months:  0         FINDINGS:  Hardware:  None.    Lymph nodes:   No  mediastinal, hilar, or axillary lymphadenopathy.    Heart:  Coronary artery calcifications are noted.        RV/LV Diameter Ratio: Less than 1    Thoracic Aorta:  Contrast timing is designed to evaluate the pulmonary arteries.  The thoracic aorta has a normal contour.  Contrast opacification of the aorta is suboptimal for evaluation of the lumen.    Pulmonary Vessels:  There are bilateral filling defects involving the distal right and left pulmonary arteries, with segmental filling defects bilaterally as well. Clot burden is moderate    Lungs and Airways:  There are mild chronic changes at the lung apices. There is left basilar discoid atelectasis or scarring    Pleura: No pleural effusion.  No pneumothorax.    Upper Abdomen: Visualized portions of the upper abdominal viscera are unremarkable.    Bones: Degenerative changes of the thoracic spine.        Impression    Positive for bilateral pulmonary emboli. Clot burden is moderate. There are no changes to suggest right-sided heart failure.      One or more dose reduction techniques were used (e.g., Automated exposure control, adjustment of the mA and/or kV according to patient size, use of iterative reconstruction technique).    A Critical Document Only message has been documented for WESTON CHILDERS in the [facility] system on 07/02/2022 11:29 AM, Message ID [id number].      Radiologist location ID: [id number]       ASSESSMENT/RECOMMENDATIONS:   1. VTE: recurrent PE on Apixaban 5 mg BID.    Diagnosed  with bilateral PE in 2022 after travel initially treated with Apixaban x 9 months then had recurrent PE off of Apixaban in 11/2021 treated with IV Heparin switched to Apixaban.    A CTA thorax on 11/17/21 showed segmental pulmonary emboli have developed in all 5 lung lobes. A CTA Thorax on 06/17/22 showed improved PE with only equivocal PA filling defects in the right middle lobe and left upper lobe branches. Another CTA Thorax on 07/02/22 showed new  bilateral filling defects involving the distal right and left pulmonary arteries, with segmental filling defects bilaterally as well. Clot burden is moderate.    Agree wthis may represent Apixaban treatment failure since patient reports compliance with medicine. Will therefore place on alternative anticoagulant Rivaroxaban loading dose (15 mg BID x 21 days) then maintenance 20 mg PO daily on 07/04/22. Last dose Enoxaparin PM 07/03/22.    PLAN  1. Start Rivaroxaban loading dose (15 mg BID x 21 days) then maintenance 20 mg PO daily.  2. D/C Enoxaparin after 07/03/22 PM dose.  3. I will schedule outpatient Hematology follow-up.    The total amount of time spent on this encounter including review of historical information, labs, direct face-to-face time, discussion of care plan, care coordination and documentation was 90 minutes.    Percell Miller, MD  07/03/2022, 12:40     DISCLAIMER:  This note was partially created using voice recognition software which is subject to errors that may escape proof reading.  If typographical errors are noted and/or phrasing does not make sense, please contact me for corrections. I reserve the right to change note to correct mistakes related to M*Modal misinterpretation._______________________________________________________________________

## 2022-07-04 DIAGNOSIS — F32A Depression, unspecified: Secondary | ICD-10-CM

## 2022-07-04 DIAGNOSIS — Z7901 Long term (current) use of anticoagulants: Secondary | ICD-10-CM

## 2022-07-04 DIAGNOSIS — Z9989 Dependence on other enabling machines and devices: Secondary | ICD-10-CM

## 2022-07-04 DIAGNOSIS — F419 Anxiety disorder, unspecified: Secondary | ICD-10-CM

## 2022-07-04 DIAGNOSIS — J441 Chronic obstructive pulmonary disease with (acute) exacerbation: Secondary | ICD-10-CM

## 2022-07-04 LAB — CBC
HCT: 35.8 % (ref 31.2–41.9)
HGB: 11.8 g/dL (ref 10.9–14.3)
MCH: 28.1 pg (ref 24.7–32.8)
MCHC: 32.9 g/dL (ref 32.3–35.6)
MCV: 85.4 fL (ref 75.5–95.3)
MPV: 8.3 fL (ref 7.9–10.8)
PLATELETS: 265 10*3/uL (ref 140–440)
RBC: 4.19 10*6/uL (ref 3.63–4.92)
RDW: 13.1 % (ref 12.3–17.7)
WBC: 15.5 10*3/uL — ABNORMAL HIGH (ref 3.8–11.8)

## 2022-07-04 LAB — BASIC METABOLIC PANEL
ANION GAP: 6 mmol/L (ref 4–13)
BUN/CREA RATIO: 19 (ref 6–22)
BUN: 21 mg/dL (ref 7–25)
CALCIUM: 9.5 mg/dL (ref 8.6–10.3)
CHLORIDE: 105 mmol/L (ref 98–107)
CO2 TOTAL: 29 mmol/L (ref 21–31)
CREATININE: 1.09 mg/dL (ref 0.60–1.30)
ESTIMATED GFR: 53 mL/min/{1.73_m2} — ABNORMAL LOW (ref >59)
GLUCOSE: 179 mg/dL — ABNORMAL HIGH (ref 74–109)
OSMOLALITY, CALCULATED: 287 mOsm/kg (ref 270–290)
POTASSIUM: 4.7 mmol/L (ref 3.5–5.1)
SODIUM: 140 mmol/L (ref 136–145)

## 2022-07-04 LAB — PROCALCITONIN: PROCALCITONIN: 0.02 ng/mL (ref <0.50)

## 2022-07-04 MED ORDER — PREDNISONE 20 MG TABLET
40.0000 mg | ORAL_TABLET | Freq: Every morning | ORAL | Status: DC
Start: 2022-07-04 — End: 2022-07-04
  Administered 2022-07-04: 40 mg via ORAL
  Filled 2022-07-04: qty 2

## 2022-07-04 MED ORDER — RIVAROXABAN 2.5 MG TABLET
ORAL_TABLET | ORAL | 0 refills | Status: DC
Start: 2022-07-04 — End: 2023-02-03

## 2022-07-04 MED ORDER — RIVAROXABAN 10 MG TABLET
20.0000 mg | ORAL_TABLET | Freq: Every evening | ORAL | Status: DC
Start: 2022-07-25 — End: 2022-07-04

## 2022-07-04 MED ORDER — RIVAROXABAN 20 MG TABLET
20.0000 mg | ORAL_TABLET | Freq: Every evening | ORAL | 0 refills | Status: AC
Start: 2022-07-04 — End: ?

## 2022-07-04 MED ORDER — RIVAROXABAN 15 MG TABLET
15.0000 mg | ORAL_TABLET | Freq: Two times a day (BID) | ORAL | Status: DC
Start: 2022-07-04 — End: 2022-07-04
  Administered 2022-07-04: 15 mg via ORAL
  Filled 2022-07-04: qty 1

## 2022-07-04 MED ORDER — PREDNISONE 20 MG TABLET
40.0000 mg | ORAL_TABLET | Freq: Every morning | ORAL | 0 refills | Status: AC
Start: 2022-07-05 — End: 2022-07-12

## 2022-07-04 MED ORDER — RIVAROXABAN 15 MG TABLET
15.0000 mg | ORAL_TABLET | Freq: Two times a day (BID) | ORAL | 0 refills | Status: DC
Start: 2022-07-04 — End: 2023-02-03

## 2022-07-04 NOTE — Discharge Summary (Signed)
[place] [facility]     DISCHARGE SUMMARY      PATIENT NAME:  Kathleen Munoz   MRN:  [id number]  DOB:  [birth year]    INPATIENT ADMISSION DATE: 07/02/2022   DATE OF DISCHARGE:  07/04/22     ATTENDING PHYSICIAN: Tommye Standard, DO     PRIMARY CARE PHYSICIAN: Madolyn Frieze, MD     HOSPITAL COURSE:    76yo F with hx of nonhealing ulcer of R ankle, hx of PE on Eliquis, HTN, chronic respiratory failure on 2L who presented with increasing SOB.      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 Most recently with segmental PE's in all lobes on 5/23 started on Eliquis. Re-presented to the ED in 11/23 with SOB, CT w/o large PE but equivocal for PA filling defects. CTA this admission with bilateral PE's with moderate clot burdern (segmental). No e/o RHS. Patient requiring 3L from 2L baseline. Last echo in 5/23 with mod PHTN. Trop negative, BNP 250. Bilateral venous duplex positive for bilateral DVT's. Patient was started on Lovenox and evaluated by heme/onc who agreed that this is likely an Eliquis failure and recommended patient be started on Xarelto. Patient will follow up closely with heme/onc for recurrent unprovoked DVT/PE. Additionally patient was found to have acute on chronic respiratory failure due to RSV infection. She was treated with steroids and duonebs. Patient has a chronic RLE wound that was healing well.      Problem List:  Active Hospital Problems   (*Primary Problem)    Diagnosis    *Bilateral pulmonary embolism (CMS HCC)    RSV (acute bronchiolitis due to respiratory syncytial virus)    COPD exacerbation (CMS HCC)    Sleep apnea     Chronic     CPAP      Diabetes mellitus, type 2 (CMS HCC)     Chronic               PHYSICAL EXAM   DAY OF DISCHARGE:    BP (!) 148/67   Pulse 68   Temp 36.6 C (97.8 F)   Resp 18   Ht 1.626 m (5\' 4" )   Wt 94.2 kg (207 lb 10.8 oz)   SpO2 97%   BMI 35.65 kg/m          General:  Patient is resting in bed, no  acute distress, alert and oriented x3   Eyes:  PERRL, no scleral icterus   HENT:  Normocephalic, atraumatic, oral mucosa is moist and pink, no nasal discharge   Heart:  RRR, S1 and S2 auscultated, no murmurs appreciated   Lungs:  Unlabored respirations. CTA bilaterally  Abdomen:  Soft, active bowel sounds, non-tender to palpation, non-distended  Extremities:  Pulses equal in all extremities bilaterally.  Capillary refill less than 3 seconds.  No edema in lower extremities bilaterally   Skin:  ulcer on later aspect of R ankle without surrounding erythma or edema  Neuro:  A&O x 3.  No focal deficits.  Speech intact.  Not tremulous  Psych:  Cooperative, not agitated    LABS:  CBC with Diff (Last 48 Hours):    Recent Results in last 48 hours     07/03/22  0135 07/04/22  0232   WBC 9.5 15.5*   HGB 11.9 11.8   HCT 35.5 35.8   MCV 84.2 85.4   PLTCNT 255 265   PMNS 86*  --  LYMPHOCYTES 11*  --    MONOCYTES 2*  --    EOSINOPHIL 0*  --    BASOPHILS 1  0.10  --           Last BMP  (Last result in the past 48 hours)        Na   K   Cl   CO2   BUN   Cr   Calcium   Glucose   Glucose-Fasting        07/04/22 0232 140   4.7   105   29   21   1.09   9.5   179                  BMP (Last 48 Hours):    Recent Results in last 48 hours     07/03/22  0135 07/04/22  0232   SODIUM 141 140   POTASSIUM 4.8 4.7   CHLORIDE 106 105   CO2 26 29   BUN 21 21   CREATININE 1.14 1.09   CALCIUM 9.4 9.5   GLUCOSENF 161* 179*          IMAGING:             DISCHARGE MEDICATIONS:     Current Discharge Medication List        CONTINUE these medications - NO CHANGES were made during your visit.        Details   albuterol sulfate 90 mcg/actuation oral inhaler  Commonly known as: PROVENTIL or VENTOLIN or PROAIR   1-2 Puffs, Inhalation, EVERY 6 HOURS PRN  Refills: 0     apixaban 5 mg Tablet  Commonly known as: ELIQUIS   5 mg, Oral, 2 TIMES DAILY  Qty: 60 Tablet  Refills: 0     aspirin 81 mg Tablet, Chewable   81 mg, Oral, DAILY  Refills: 0     buPROPion 150 mg  tablet sustained-release 12 hr  Commonly known as: WELLBUTRIN SR   150 mg, Oral, 2 TIMES DAILY WITH BREAKFAST & LUNCH  Qty: 60 Tablet  Refills: 0     busPIRone 10 mg Tablet  Commonly known as: BUSPAR   10 mg, Oral, 3 TIMES DAILY  Qty: 90 Tablet  Refills: 0     citalopram 40 mg Tablet  Commonly known as: CeleXA   40 mg, Oral, DAILY  Qty: 30 Tablet  Refills: 0     clonazePAM 1 mg Tablet  Commonly known as: klonoPIN   1 mg, Oral, 3 TIMES DAILY  Refills: 0     fexofenadine 180 mg Tablet  Commonly known as: ALLEGRA   180 mg, Oral, DAILY  Refills: 0     gabapentin 300 mg Capsule  Commonly known as: NEURONTIN   300 mg, Oral, 3 TIMES DAILY  Refills: 0     ipratropium bromide 21 mcg (0.03 %) nasal spray  Commonly known as: ATROVENT   2 Sprays, Nasal, 2 TIMES DAILY PRN  Qty: 30 mL  Refills: 12     levothyroxine 88 mcg Tablet  Commonly known as: SYNTHROID   88 mcg, Oral, EVERY MORNING  Refills: 0     lisinopriL-hydrochlorothiazide 20-12.5 mg Tablet  Commonly known as: ZESTORETIC   1 Tablet, Oral, DAILY  Refills: 0     metoprolol succinate 25 mg Tablet Sustained Release 24 hr  Commonly known as: TOPROL-XL   25 mg, Oral, DAILY  Qty: 30 Tablet  Refills: 0     multivitamin Capsule  1 Capsule, Oral, DAILY  Refills: 0     omeprazole 40 mg Capsule, Delayed Release(E.C.)  Commonly known as: PRILOSEC   40 mg, Oral, 2 TIMES DAILY  Refills: 0     SITagliptin phosphate 100 mg Tablet  Commonly known as: JANUVIA   100 mg, Oral, DAILY  Refills: 0     traZODone 150 mg Tablet  Commonly known as: DESYREL   200 mg, Oral, NIGHTLY  Refills: 0                 DISCHARGE DISPOSITION:  home    DISCHARGE INSTRUCTIONS:    Follow-up Information       Billips, Durward Mallard, MD Follow up in 1 week(s).    Specialty: FAMILY MEDICINE  Contact information:  [address]  [place] [address]  [phone]               Brayton Mars, MD Follow up.    Specialties: MEDICAL ONCOLOGY, INTERNAL MEDICINE  Why: re: DOAC failure  Contact information:  [address]  [place] [address]  [phone]                           No discharge procedures on file.   Follow-up Information       Billips, Durward Mallard, MD Follow up in 1 week(s).    Specialty: FAMILY MEDICINE  Contact information:  [address]  [place] [address]  [phone]               Brayton Mars, MD Follow up.    Specialties: MEDICAL ONCOLOGY, INTERNAL MEDICINE  Why: re: DOAC failure  Contact information:  [address]  [place] [address]  [phone]                                Isolation Status:  Droplet & Contact    Copies sent to Care Team         Relationship Specialty Notifications Start End    Madolyn Frieze, MD PCP - General FAMILY PRACTICE  09/30/21     Phone: [phone] Fax: [phone]         [address] [place] [address]            >30 minutes total were spent coordinating discharge day today      Tommye Standard, DO  [facility] MEDICINE HOSPITALIST

## 2022-07-04 NOTE — Care Plan (Signed)
Patient admitted with bilateral pulmonary emboli. 12/21 found bilateral lower extremity DVT. Patient received Lovenox per mar. Plan to discharge on Xarelto. Patient returned to home 02 level of 2L. Lung sounds much improved. Vital signs stable. Education ongoing.     Problem: Adult Inpatient Plan of Care  Goal: Patient-Specific Goal (Individualized)  Outcome: Ongoing (see interventions/notes)  Flowsheets (Taken 07/03/2022 1930)  Anxieties, Fears or Concerns: upset she has DVT in BLE

## 2022-07-04 NOTE — Progress Notes (Signed)
[place]  Hematology/Oncology Progress Note     Date of Service:  07/04/2022  Kathleen Munoz   76 y.o. female  Date of Admission:  07/02/2022  Date of Birth:  [birth year]    REQUESTING MD:  Maxine Glenn, MD    REASON FOR CONSULT:     INFORMATION OBTAINED FROM:  Patient and/or medical record    HISTORY OF PRESENT ILLNESS:  Kathleen Munoz is a 76 y.o., White female admitted with increasing shortness of breath found to have COPD exacerbation and RSV bronchiolitis and recurrent PE.      She reports she has been sick for approximately last 7 days with shortness a breath, yellow thick productive cough.  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  She states compliance with Apixaban.      ER workup on 07/02/22 showed pulmonary embolus without evidence of right heart strain on CT scan. Apixaban was held and started on IV Heparin.    She denies chest pain, fever, chills.     HEMATOLOGY/MEDICAL ONCOLOGY HISTORY:   Diagnosed with bilateral PE in 2022 after travel initially treated with Apixaban x 9 months then had recurrent PE off of Apixaban in 11/2021 treated with IV Heparin switched to Apixaban.    A CTA thorax on 11/17/21 showed segmental pulmonary emboli have developed in all 5 lung lobes. A CTA Thorax on 06/17/22 showed improved PE with only equivocal PA filling defects in the right middle lobe and left upper lobe branches. Another CTA Thorax on 07/02/22 showed new bilateral filling defects involving the distal right and left pulmonary arteries, with segmental filling defects bilaterally as well. Clot burden is moderate.    REVIEW OF SYSTEMS:  As discussed in HPI.    PAST MEDICAL HISTORY:  Past Medical History:   Diagnosis Date    Anxiety     Arthritis     Cellulitis and abscess of right lower extremity     Depression     Diabetes mellitus, type 2 (CMS HCC)     Esophageal reflux     H/O blood clots     LUNGS     Hypertension     Major depressive disorder     Personal history of fall     Shortness of breath     Sleep apnea     CPAP    Thyroid disease      PAST SURGICAL HISTORY:  Past Surgical History:   Procedure Laterality Date    HAND SURGERY Left     HX BUNIONECTOMY      HX TUBAL LIGATION       FAMILY HISTORY:  Family Medical History:       Problem Relation (Age of Onset)    Heart Attack Mother    Stomach Cancer Father          SOCIAL HISTORY:  Social History     Socioeconomic History    Marital status: Widowed   Tobacco Use    Smoking status: Former     Packs/day: 1.00     Years: 15.00     Additional pack years: 0.00     Total pack years: 15.00     Types: Cigarettes     Quit date: 43     Years since quitting: 30.9     Passive exposure: Past    Smokeless tobacco: Never   Vaping Use  Vaping Use: Never used   Substance and Sexual Activity    Alcohol use: Not Currently    Drug use: Never    Sexual activity: Not Currently     Partners: Male     Social Determinants of Health     Social Connections: Medium Risk (07/02/2022)    Social Connections     SDOH Social Isolation: 3 to 5 times a week      ALLERGIES:   Allergies   Allergen Reactions    Influenza Virus Vaccines      CURRENT MEDICATIONS:       DC,Completed & Canceled Medications   (27h ago, onward)                 Ordered        07/02/22 1255  lisinopril-hydrochlorothiazide (ZESTORETIC) 20-12.32m per tablet  DAILY,   Status:  Discontinued            07/02/22 1255  azithromycin (ZITHROMAX) 500 mg in NS 250 mL IVPB  EVERY 24 HOURS,   Status:  Discontinued            07/02/22 1255  cefTRIAXone (ROCEPHIN) 2 g in NS 50 mL IVPB minibag  EVERY 24 HOURS,   Status:  Discontinued            07/02/22 1147  heparin 5,000 units/mL initial IV BOLUS  (HEPARIN THROMBOTIC PROTOCOL)  ONCE            07/02/22 1147  heparin 25,000 units in D5W 250 mL infusion  (HEPARIN THROMBOTIC PROTOCOL)  CONTINUOUS,   Status:  Discontinued            07/02/22 1112  iohexol (OMNIPAQUE 350) infusion   GIVE IN RADIOLOGY            07/02/22 1030  magnesium oxide (MAG-OX) 4061m(24038mlemental magnesium) tablet  NOW            07/02/22 1059  ipratropium-albuterol 0.5 mg-3 mg(2.5 mg base)/3 mL Solution for Nebulization  NOW                           Current Outpatient Medications   Medication Instructions    albuterol sulfate (PROVENTIL OR VENTOLIN OR PROAIR) 90 mcg/actuation Inhalation oral inhaler 1-2 Puffs, Inhalation, EVERY 6 HOURS PRN    apixaban (ELIQUIS) 5 mg, Oral, 2 TIMES DAILY    aspirin 81 mg, Oral, DAILY    buPROPion (WELLBUTRIN SR) 150 mg, Oral, 2 TIMES DAILY WITH BREAKFAST & LUNCH    busPIRone (BUSPAR) 10 mg, Oral, 3 TIMES DAILY    citalopram (CELEXA) 40 mg, Oral, DAILY    clonazePAM (KLONOPIN) 1 mg, Oral, 3 TIMES DAILY    fexofenadine (ALLEGRA) 180 mg, Oral, DAILY    gabapentin (NEURONTIN) 300 mg, Oral, 3 TIMES DAILY    ipratropium bromide (ATROVENT) 21 mcg (0.03 %) Nasal nasal spray 2 Sprays, Nasal, 2 TIMES DAILY PRN    levothyroxine (SYNTHROID) 88 mcg, Oral, EVERY MORNING    lisinopriL-hydrochlorothiazide (ZESTORETIC) 20-12.5 mg Oral Tablet 1 Tablet, Oral, DAILY    metoprolol succinate (TOPROL-XL) 25 mg, Oral, DAILY    multivitamin Oral Capsule 1 Capsule, Oral, DAILY    omeprazole (PRILOSEC) 40 mg, Oral, 2 TIMES DAILY    SITagliptin phosphate (JANUVIA) 100 mg, Oral, DAILY    traZODone (DESYREL) 200 mg, Oral, NIGHTLY      PHYSICAL EXAMINATION:  Patient Vitals for the past 24 hrs:   BP  Temp Pulse Resp SpO2 Weight   07/04/22 1000 -- -- -- -- 97 % --   07/04/22 0804 (!) 148/67 36.6 C (97.8 F) -- -- -- --   07/04/22 0415 138/64 36.7 C (98.1 F) 56 18 94 % 94.2 kg (207 lb 10.8 oz)   07/04/22 0002 137/62 36.7 C (98.1 F) 67 19 91 % --   07/03/22 2231 -- -- 62 -- -- --   07/03/22 2055 -- 36.5 C (97.7 F) -- -- -- --   07/03/22 2052 -- -- -- -- 94 % --   07/03/22 2000 (!) 142/57 -- 65 20 94 % --   07/03/22 1711 (!) 126/58 36.4 C (97.5 F) 61 19 93 % --   07/03/22 1327 -- -- -- -- 92 % --      ECOG  Status: 1  Physical Exam  Constitutional:       Appearance: Normal appearance.   HENT:      Head: Normocephalic and atraumatic.      Nose: Nose normal.      Mouth/Throat:      Mouth: Mucous membranes are moist.      Pharynx: Oropharynx is clear.   Cardiovascular:      Rate and Rhythm: Normal rate and regular rhythm.      Pulses: Normal pulses.      Heart sounds: Normal heart sounds.   Pulmonary:      Effort: Pulmonary effort is normal.      Breath sounds: Normal breath sounds.   Abdominal:      General: Bowel sounds are normal.      Palpations: Abdomen is soft.   Musculoskeletal:         General: Normal range of motion.      Cervical back: Normal range of motion and neck supple.   Skin:     General: Skin is warm and dry.   Neurological:      General: No focal deficit present.      Mental Status: She is alert and oriented to person, place, and time.   Psychiatric:         Mood and Affect: Mood normal.         Behavior: Behavior normal.        LABORATORY DATA:  Results for orders placed or performed during the hospital encounter of 07/02/22 (from the past 24 hour(s))   BASIC METABOLIC PANEL - AM ONCE   Result Value Ref Range    SODIUM 140 136 - 145 mmol/L    POTASSIUM 4.7 3.5 - 5.1 mmol/L    CHLORIDE 105 98 - 107 mmol/L    CO2 TOTAL 29 21 - 31 mmol/L    ANION GAP 6 4 - 13 mmol/L    CALCIUM 9.5 8.6 - 10.3 mg/dL    GLUCOSE 179 (H) 74 - 109 mg/dL    BUN 21 7 - 25 mg/dL    CREATININE 1.09 0.60 - 1.30 mg/dL    BUN/CREA RATIO 19 6 - 22    ESTIMATED GFR 53 (L) >59 mL/min/1.74m2    OSMOLALITY, CALCULATED 287 270 - 290 mOsm/kg   CBC - AM ONCE   Result Value Ref Range    WBC 15.5 (H) 3.8 - 11.8 x10^3/uL    RBC 4.19 3.63 - 4.92 x10^6/uL    HGB 11.8 10.9 - 14.3 g/dL    HCT 35.8 31.2 - 41.9 %    MCV 85.4 75.5 - 95.3 fL    MCH 28.1 24.7 -  32.8 pg    MCHC 32.9 32.3 - 35.6 g/dL    RDW 13.1 12.3 - 17.7 %    PLATELETS 265 140 - 440 x10^3/uL    MPV 8.3 7.9 - 10.8 fL     IMAGING STUDIES:    CT ANGIO CHEST FOR PULMONARY EMBOLUS W IV  CONTRAST   Final Result by Edi, Radresults In (12/20 1133)   Positive for bilateral pulmonary emboli. Clot burden is moderate. There are no changes to suggest right-sided heart failure.         One or more dose reduction techniques were used (e.g., Automated exposure control, adjustment of the mA and/or kV according to patient size, use of iterative reconstruction technique).      A Critical Document Only message has been documented for WESTON CHILDERS in the [facility] Findings system on 07/02/2022 11:29 AM, Message ID [id number].         Radiologist location ID: [id number]         XR CHEST PA AND LATERAL   Final Result by Edi, Radresults In (12/20 1048)   Radiographic findings suggestive of bronchiolitis particularly about the left perihilar region.         Radiologist location ID: [id number]            Results for orders placed or performed during the hospital encounter of 07/02/22   XR CHEST PA AND LATERAL     Status: None    Narrative    Chyane S Herschberger    RADIOLOGIST: Karna Dupes    XR CHEST PA AND LATERAL performed on 07/02/2022 10:40 AM    CLINICAL HISTORY: COUGH, SHORTNESS OF BREATH.  pt c/o sob and low HR    TECHNIQUE: Frontal and lateral views of the chest.    COMPARISON:  10/11/2021    FINDINGS:  The patient is slightly rotated to the left.    Faint left perihilar interstitial opacities with a few areas of peribronchial thickening are present which may be related to bronchiolitis-airways disease.     No consolidative airspace disease is identified. No pleural effusion or pneumothorax is seen.  Cardiac silhouette remains normal in size. Lungs remain symmetrically inflated. Trachea remains at midline.      Impression    Radiographic findings suggestive of bronchiolitis particularly about the left perihilar region.      Radiologist location ID: [id number]     CT ANGIO CHEST FOR PULMONARY EMBOLUS W IV CONTRAST     Status: None    Narrative    Laytoya S Vahle    RADIOLOGIST: Berton Bon,  MD    CT ANGIO CHEST FOR PULMONARY EMBOLUS W IV CONTRAST performed on 07/02/2022 11:10 AM    CLINICAL HISTORY: SHORTNESS OF BREATH, HISTORY OF PULMONARY EMBOLI.  SHORTNESS OF BREATH, HISTORY OF PULMONARY EMBOLI, positive for rsv    TECHNIQUE: CTA imaging of the chest with intravenous contrast.  3D reconstructions.  CONTRAST:  80 ml's of Omnipaque 350    COMPARISON: 06/17/2022  # of known CTs in the past 12 months:  2   # of known Cardiac Nuclear Medicine Studies in the past 12 months:  0         FINDINGS:  Hardware:  None.    Lymph nodes:   No mediastinal, hilar, or axillary lymphadenopathy.    Heart:  Coronary artery calcifications are noted.        RV/LV Diameter Ratio: Less than 1    Thoracic Aorta:  Contrast timing is designed to  evaluate the pulmonary arteries.  The thoracic aorta has a normal contour.  Contrast opacification of the aorta is suboptimal for evaluation of the lumen.    Pulmonary Vessels:  There are bilateral filling defects involving the distal right and left pulmonary arteries, with segmental filling defects bilaterally as well. Clot burden is moderate    Lungs and Airways:  There are mild chronic changes at the lung apices. There is left basilar discoid atelectasis or scarring    Pleura: No pleural effusion.  No pneumothorax.    Upper Abdomen: Visualized portions of the upper abdominal viscera are unremarkable.    Bones: Degenerative changes of the thoracic spine.        Impression    Positive for bilateral pulmonary emboli. Clot burden is moderate. There are no changes to suggest right-sided heart failure.      One or more dose reduction techniques were used (e.g., Automated exposure control, adjustment of the mA and/or kV according to patient size, use of iterative reconstruction technique).    A Critical Document Only message has been documented for WESTON CHILDERS in the [facility] Findings system on 07/02/2022 11:29 AM, Message ID [id number].      Radiologist location ID:  [id number]       ASSESSMENT/RECOMMENDATIONS:   1. 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    Diagnosed with bilateral PE in 2022 after travel initially treated with Apixaban x 9 months then had recurrent PE off of Apixaban in 11/2021 treated with IV Heparin switched to Apixaban.    A CTA thorax on 11/17/21 showed segmental pulmonary emboli have developed in all 5 lung lobes. A CTA Thorax on 06/17/22 showed improved PE with only equivocal PA filling defects in the right middle lobe and left upper lobe branches. Another CTA Thorax on 07/02/22 showed new bilateral filling defects involving the distal right and left pulmonary arteries, with segmental filling defects bilaterally as well. Clot burden is moderate. Also with B/L LE DVT diagnosed on 07/03/22.    Agree this may represent Apixaban treatment failure since patient reports compliance with medicine. Will therefore place on alternative anticoagulant Rivaroxaban loading dose (15 mg BID x 21 days) then maintenance 20 mg PO daily on 07/04/22.     PLAN  1. Started Rivaroxaban loading dose (15 mg BID x 21 days) then maintenance 20 mg PO daily on 07/04/22.  2. I will schedule outpatient Hematology follow-up.    The total amount of time spent on this encounter including review of historical information, labs, direct face-to-face time, discussion of care plan, care coordination and documentation was 30 minutes.    Percell Miller, MD  07/03/2022, 12:40     DISCLAIMER:  This note was partially created using voice recognition software which is subject to errors that may escape proof reading.  If typographical errors are noted and/or phrasing does not make sense, please contact me for corrections. I reserve the right to change note to correct mistakes related to M*Modal misinterpretation._______________________________________________________________________

## 2022-07-05 LAB — ECG 12 LEAD
Atrial Rate: 47 {beats}/min
Calculated P Axis: 84 degrees
Calculated R Axis: 16 degrees
Calculated T Axis: 28 degrees
PR Interval: 206 ms
QRS Duration: 108 ms
QT Interval: 498 ms
QTC Calculation: 440 ms
Ventricular rate: 47 {beats}/min

## 2022-07-07 LAB — ADULT ROUTINE BLOOD CULTURE, SET OF 2 BOTTLES (BACTERIA AND YEAST)
BLOOD CULTURE, ROUTINE: NO GROWTH
BLOOD CULTURE, ROUTINE: NO GROWTH

## 2022-07-24 ENCOUNTER — Encounter (INDEPENDENT_AMBULATORY_CARE_PROVIDER_SITE_OTHER): Payer: Medicare Other | Admitting: NURSE PRACTITIONER

## 2022-08-04 IMAGING — DX XRAY SHOULDER MINIMUM 2 VIEW LT
1 series · 2 of 2 positions shown · non-contrast
Comparison: Left shoulder x-ray dated 02/26/2022.

﻿EXAM:  [id number]   XRAY SHOULDER MINIMUM 2 VIEW LT,XRAY SHOULDER MINIMUM 2 VIEW RT
INDICATION: Bilateral shoulder pain. Rotator cuff arthropathy.
TECHNIQUE: Two views of both shoulders.

[Series 1: apobl · 0.14mm/px · 2 of 2 slices shown]
[im 1/2]
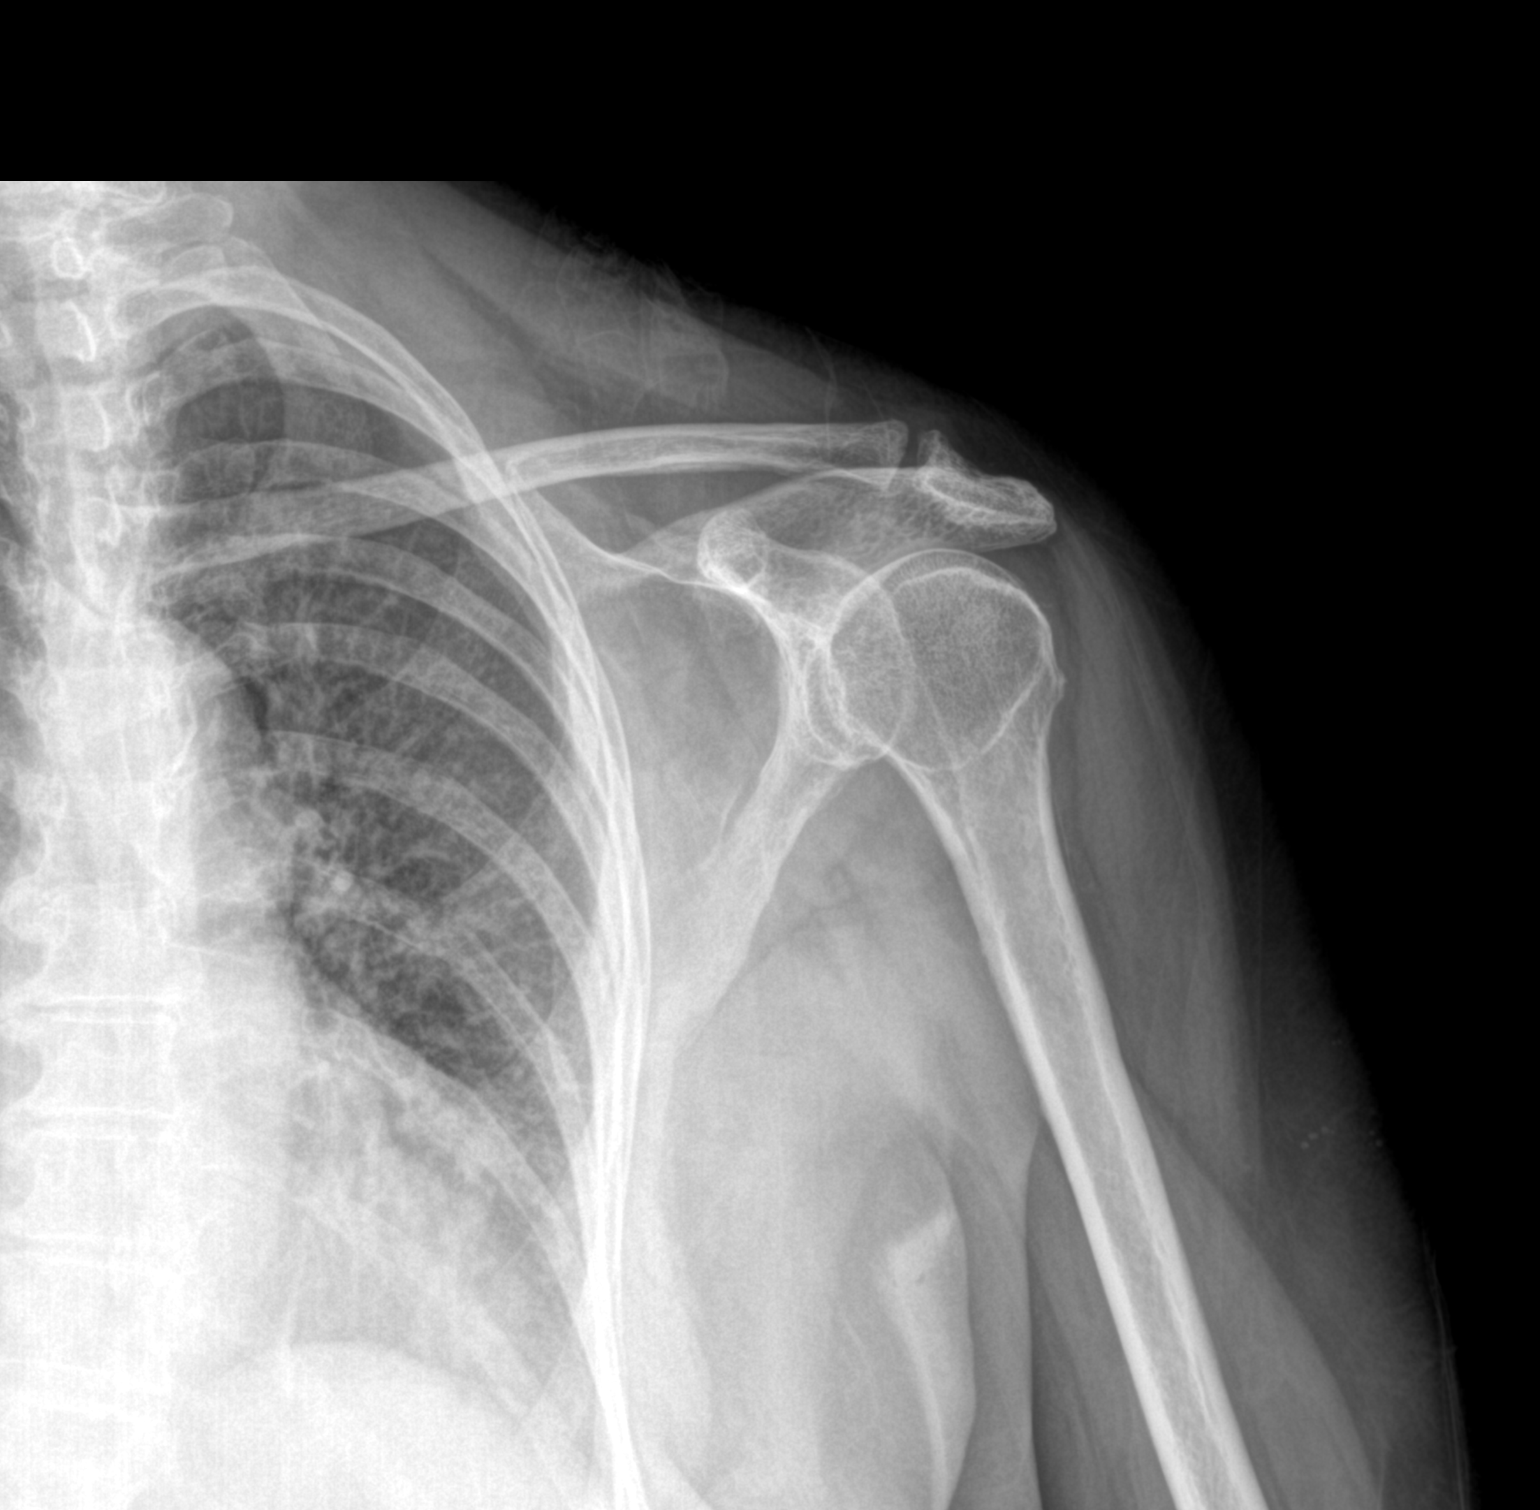
[im 2/2]
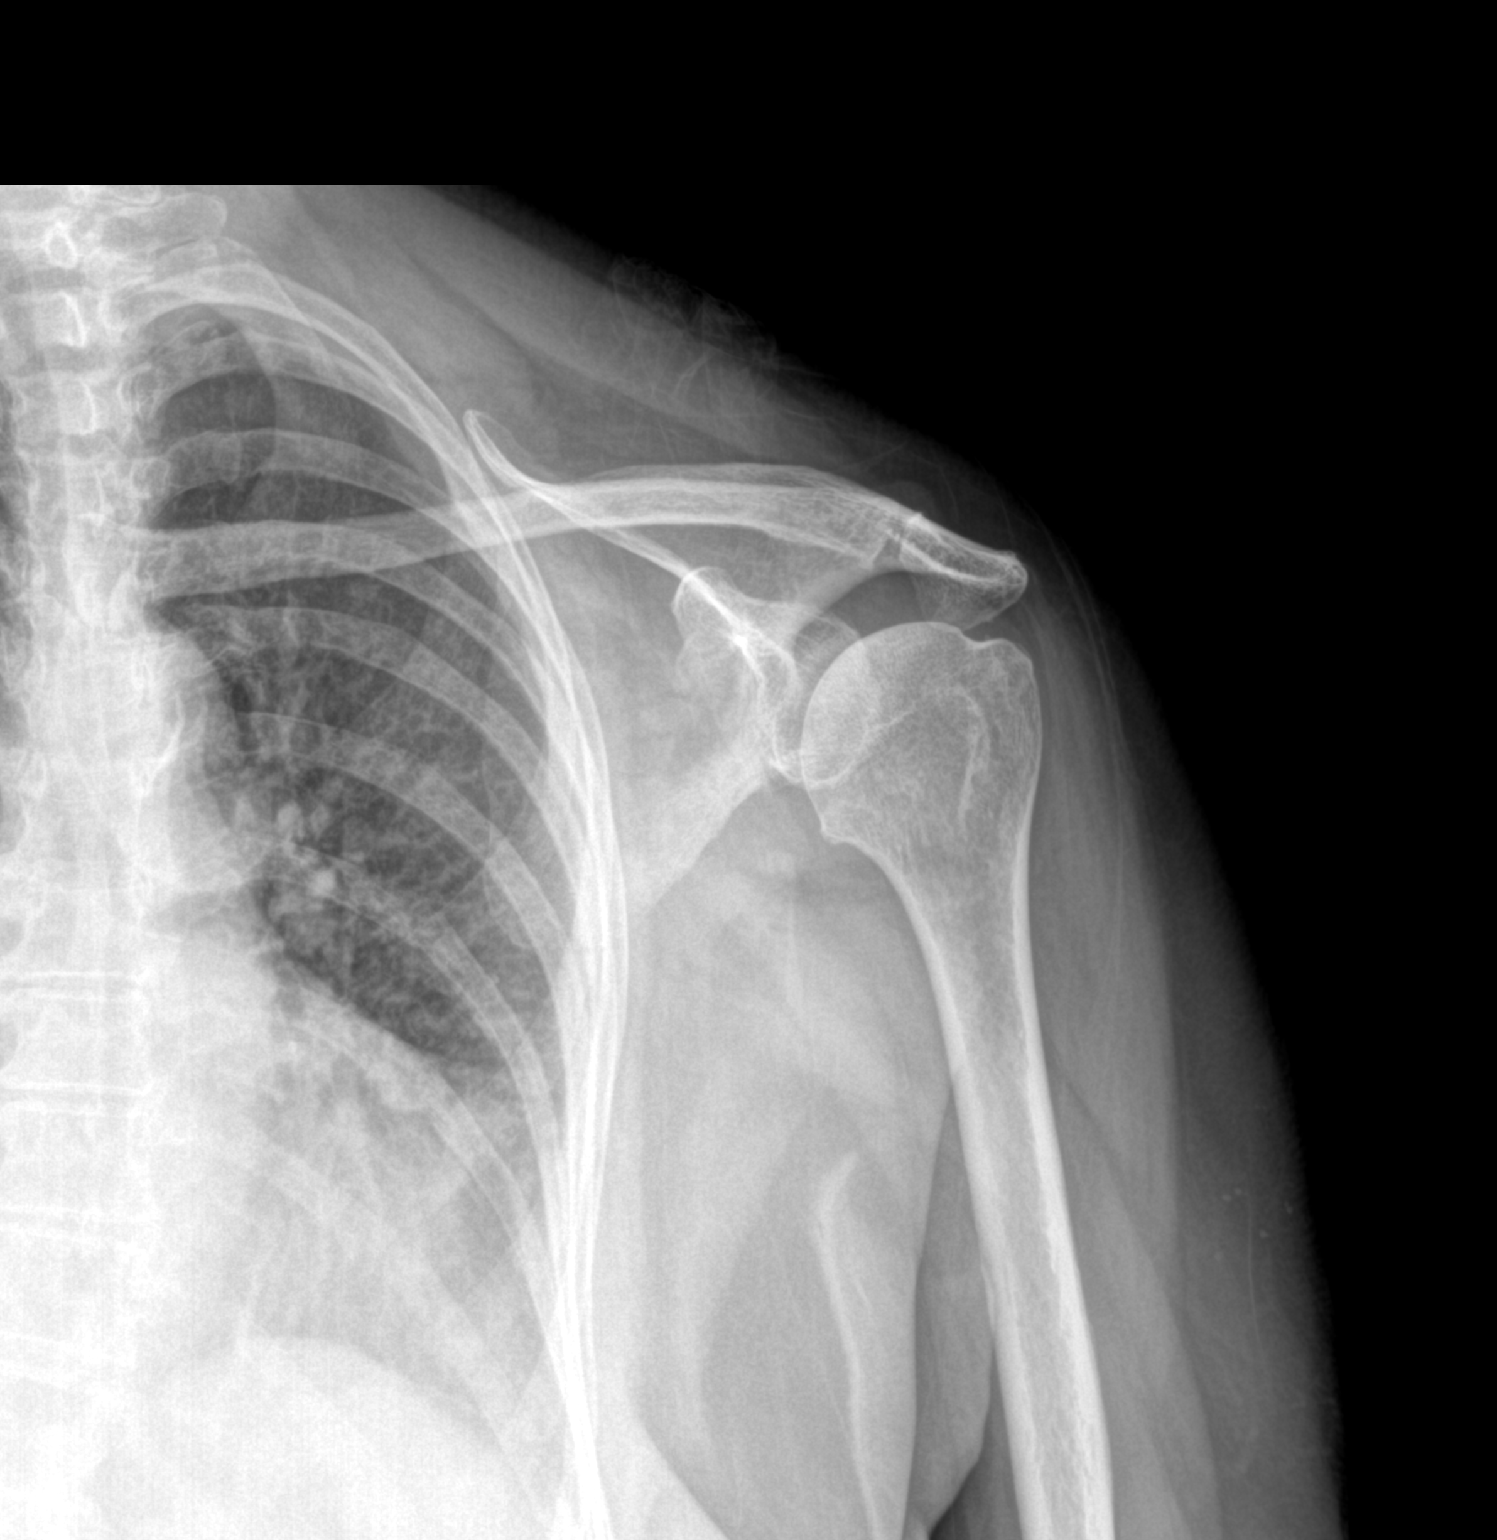

[2 of 2 positions shown; findings below may reference images not displayed]

FINDINGS: Significant degenerative changes and hook shaped acromion process are noted at the right shoulder, significantly impinging on the subacromion space at the right shoulder.  

On the left side, moderate degenerative changes are noted.  Acromion process is impinging on the left subacromion space to a lesser degree than right side.
IMPRESSION: 1. No acute bone changes at both shoulders.  Mild osteoarthritis of glenohumeral joints of both shoulders.

2. Arthropathy and acromion process changes are impinging on the subacromion space on both sides, right worse than left.  If symptoms are significant and persistent, further imaging may be considered with MRI.

## 2022-08-04 IMAGING — DX XRAY SHOULDER MINIMUM 2 VIEW RT
1 series · 2 of 2 positions shown · non-contrast
Comparison: Left shoulder x-ray dated 02/26/2022.

﻿EXAM:  [id number]   XRAY SHOULDER MINIMUM 2 VIEW LT,XRAY SHOULDER MINIMUM 2 VIEW RT
INDICATION: Bilateral shoulder pain. Rotator cuff arthropathy.
TECHNIQUE: Two views of both shoulders.

[Series 1: apobl · 0.14mm/px · 2 of 2 slices shown]
[im 1/2]
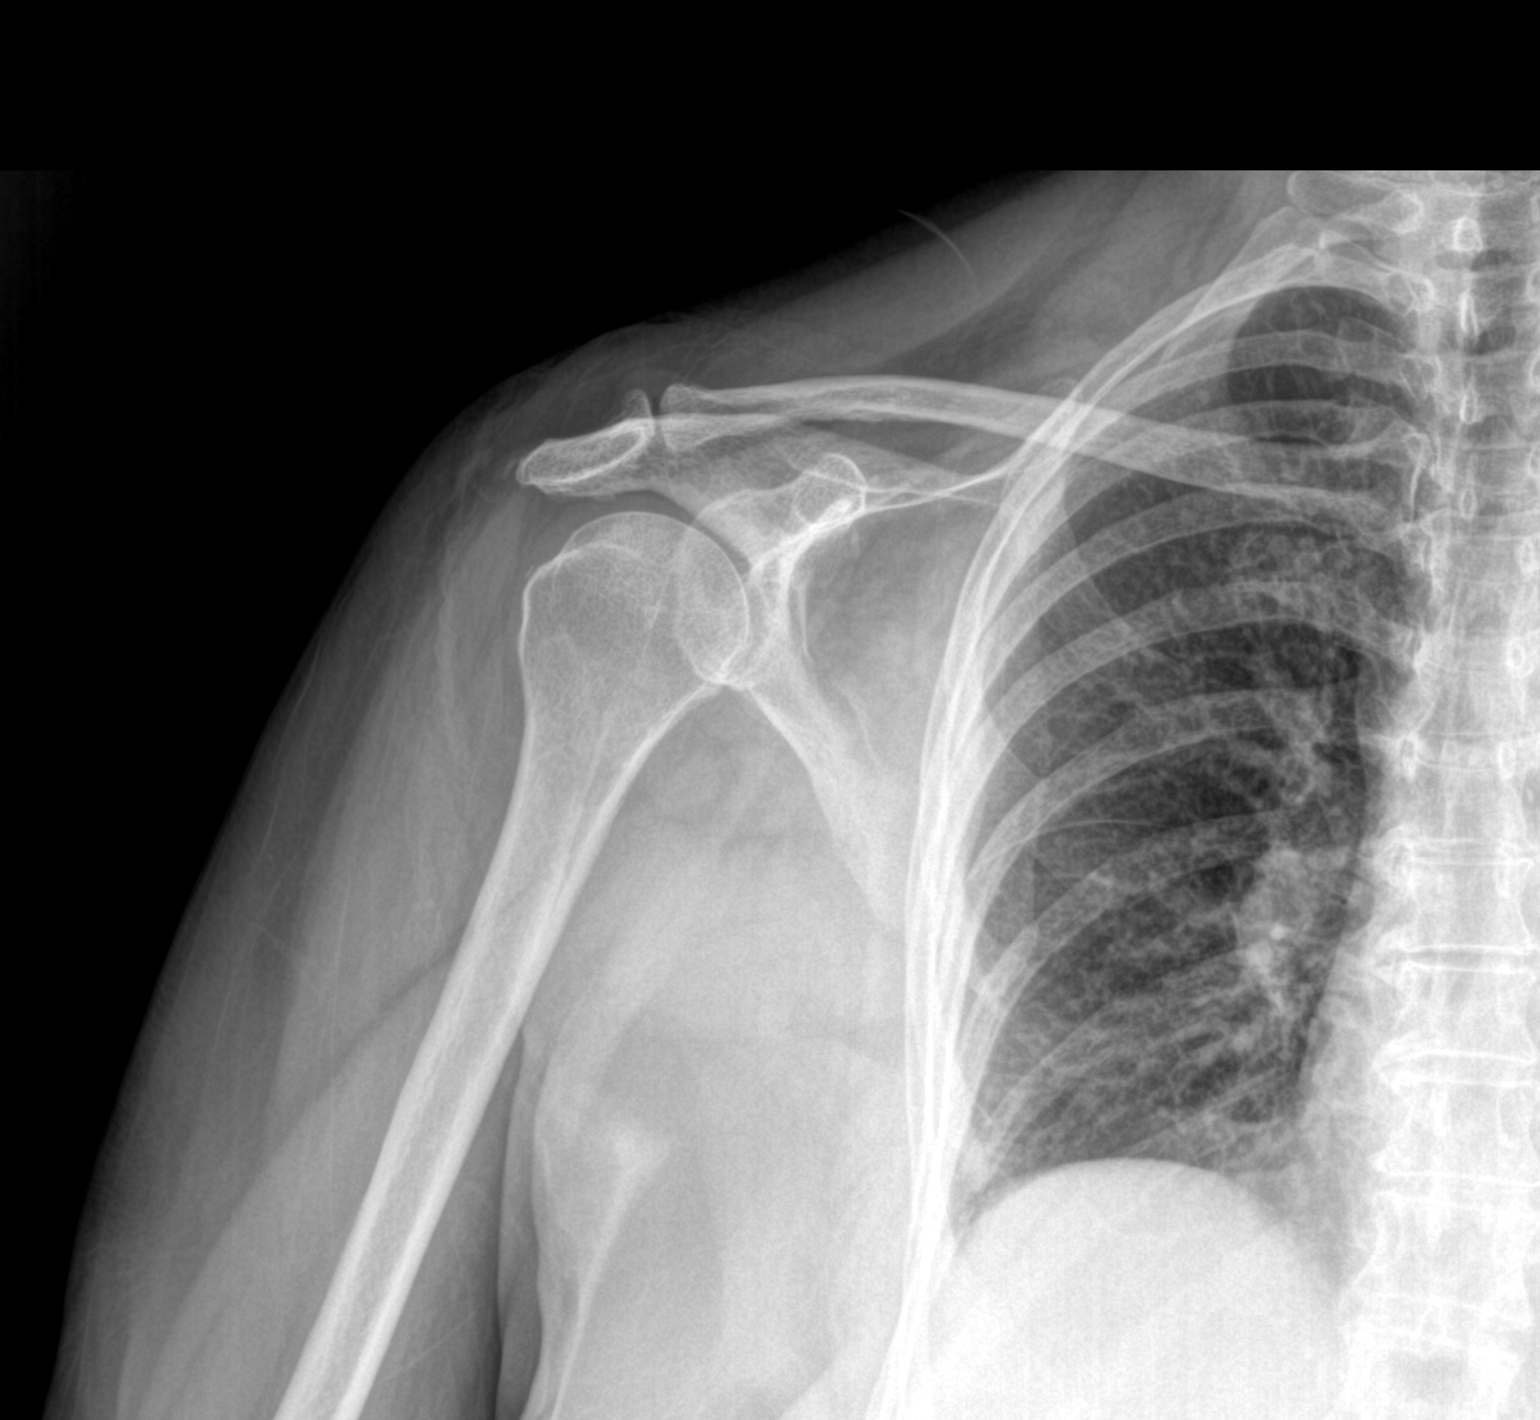
[im 2/2]
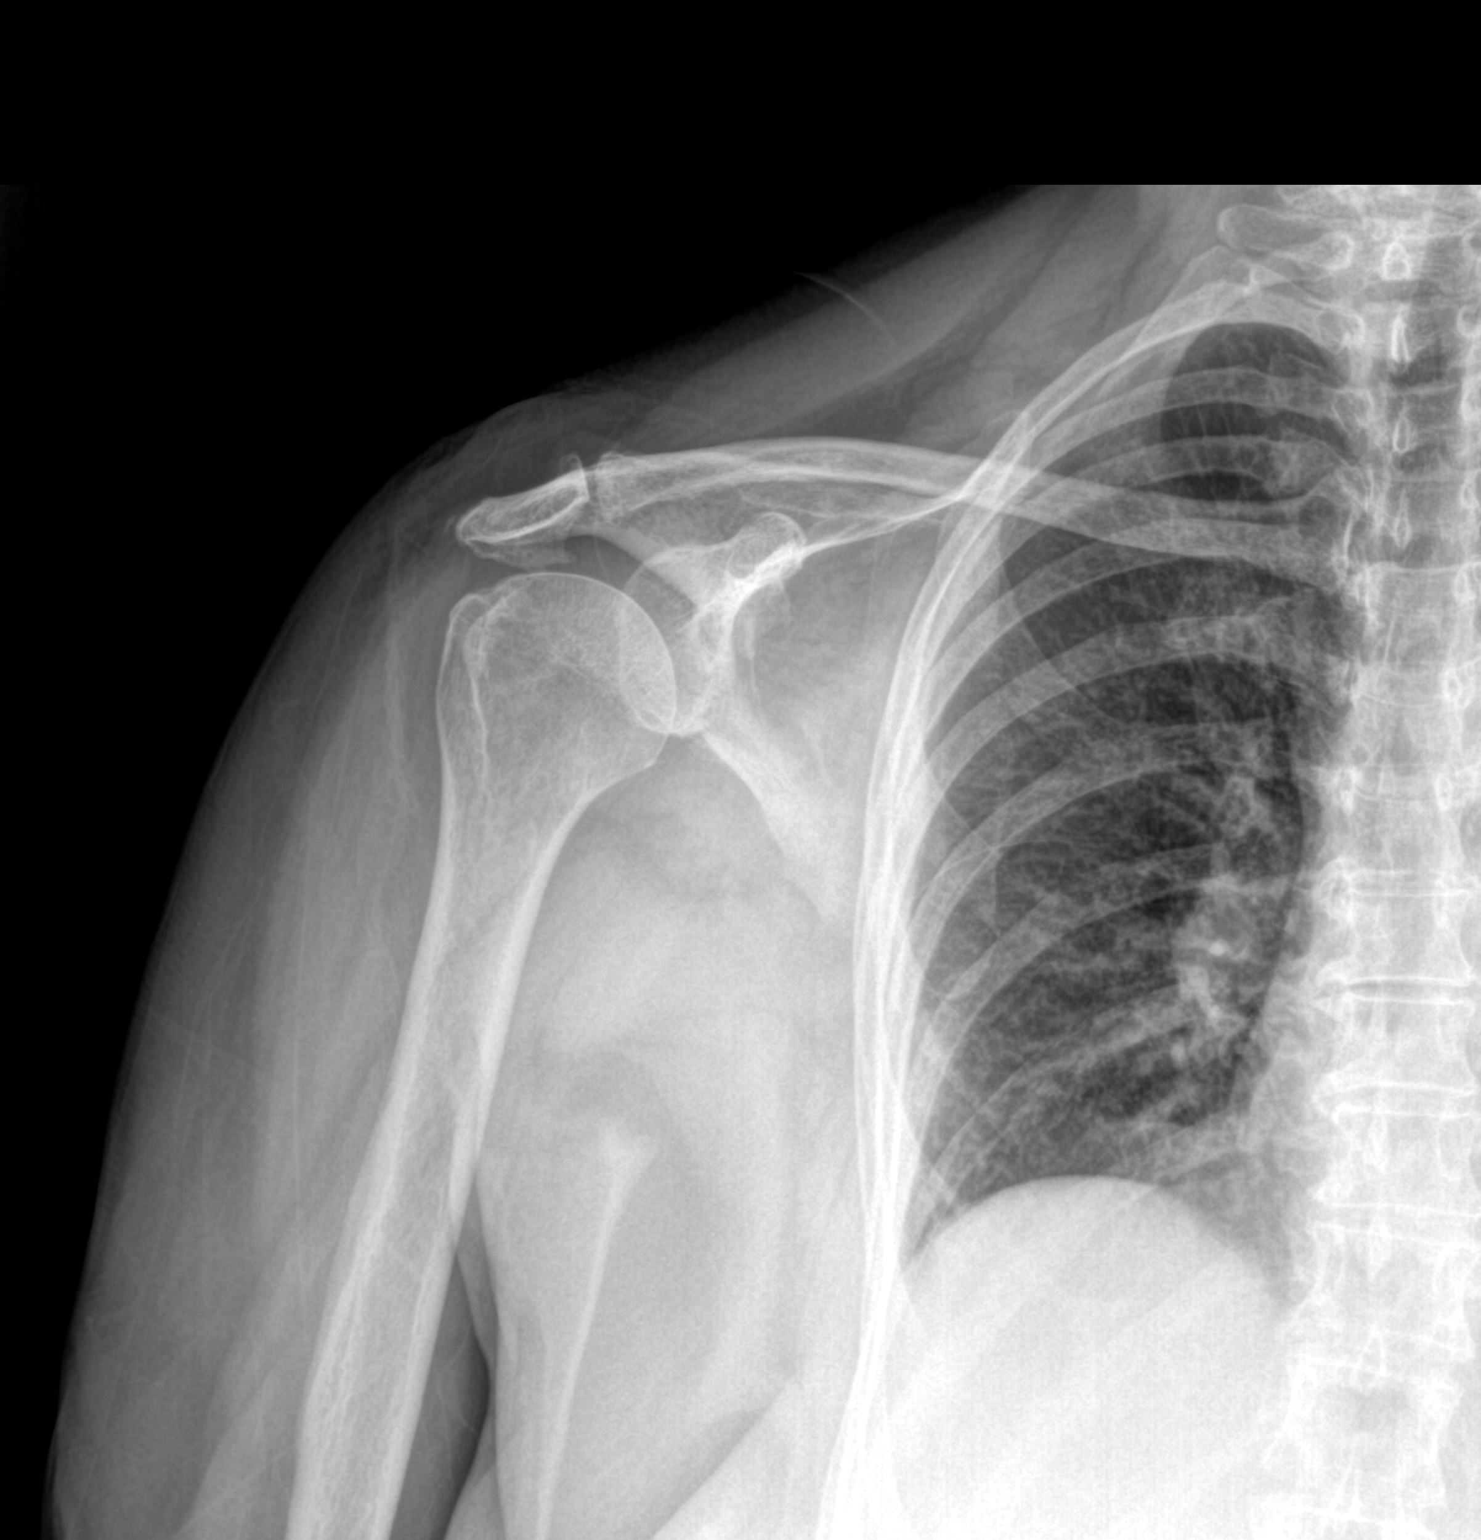

[2 of 2 positions shown; findings below may reference images not displayed]

FINDINGS: Significant degenerative changes and hook shaped acromion process are noted at the right shoulder, significantly impinging on the subacromion space at the right shoulder.  

On the left side, moderate degenerative changes are noted.  Acromion process is impinging on the left subacromion space to a lesser degree than right side.
IMPRESSION: 1. No acute bone changes at both shoulders.  Mild osteoarthritis of glenohumeral joints of both shoulders.

2. Arthropathy and acromion process changes are impinging on the subacromion space on both sides, right worse than left.  If symptoms are significant and persistent, further imaging may be considered with MRI.

## 2022-08-05 ENCOUNTER — Encounter (INDEPENDENT_AMBULATORY_CARE_PROVIDER_SITE_OTHER): Payer: Self-pay | Admitting: MEDICAL ONCOLOGY

## 2022-08-05 ENCOUNTER — Ambulatory Visit: Payer: Medicare Other | Attending: MEDICAL ONCOLOGY | Admitting: MEDICAL ONCOLOGY

## 2022-08-05 ENCOUNTER — Other Ambulatory Visit: Payer: Self-pay

## 2022-08-05 VITALS — BP 140/55 | HR 57 | Temp 98.1°F | Ht 64.0 in | Wt 210.0 lb

## 2022-08-05 DIAGNOSIS — Z7901 Long term (current) use of anticoagulants: Secondary | ICD-10-CM | POA: Insufficient documentation

## 2022-08-05 DIAGNOSIS — I2699 Other pulmonary embolism without acute cor pulmonale: Secondary | ICD-10-CM | POA: Insufficient documentation

## 2022-08-05 DIAGNOSIS — Z800 Family history of malignant neoplasm of digestive organs: Secondary | ICD-10-CM | POA: Insufficient documentation

## 2022-08-05 DIAGNOSIS — Z87891 Personal history of nicotine dependence: Secondary | ICD-10-CM | POA: Insufficient documentation

## 2022-08-05 DIAGNOSIS — I82409 Acute embolism and thrombosis of unspecified deep veins of unspecified lower extremity: Secondary | ICD-10-CM | POA: Insufficient documentation

## 2022-08-05 NOTE — Progress Notes (Signed)
Kathleen Munoz  Hematology/Oncology Progress Note     Date of Service:  08/05/2022  Kathleen Munoz   77 y.o. female  Date of Admission:  (Not on file)  Date of Birth:  09-30-1945    DIAGNOSIS: VTE: Recurrent PE and DVT.    HISTORY OF PRESENT ILLNESS:  Kathleen Munoz is a 77 y.o., White female admitted with increasing shortness of breath found to have COPD exacerbation and RSV bronchiolitis and recurrent PE.      She reports was ill for approximately last 7 days with shortness a breath and yellow thick productive cough.  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  She states compliance with Apixaban.      ER workup on 07/02/22 showed pulmonary embolus without evidence of right heart strain on CT scan. Apixaban was held and started on IV Heparin.    She denies chest pain, fever, chills.     HEMATOLOGY/MEDICAL ONCOLOGY HISTORY:   Diagnosed with bilateral PE in 2022 after travel initially treated with Apixaban x 9 months then had recurrent PE off of Apixaban in 11/2021 treated with IV Heparin and placed back on Apixaban.    A CTA thorax on 11/17/21 showed segmental pulmonary emboli have developed in all 5 lung lobes. A CTA Thorax on 06/17/22 showed improved PE with only equivocal PA filling defects in the right middle lobe and left upper lobe branches. Another CTA Thorax on 07/02/22 showed new bilateral filling defects involving the distal right and left pulmonary arteries, with segmental filling defects bilaterally as well. Clot burden is moderate.    REVIEW OF SYSTEMS: As discussed in HPI.    PAST MEDICAL HISTORY:  Past Medical History:   Diagnosis Date    Anxiety     Arthritis     Cellulitis and abscess of right lower extremity     Depression     Diabetes mellitus, type 2 (CMS HCC)     Esophageal reflux     H/O blood clots     LUNGS    Hypertension     Major depressive disorder     Personal history of fall      Shortness of breath     Sleep apnea     CPAP    Thyroid disease      PAST SURGICAL HISTORY:  Past Surgical History:   Procedure Laterality Date    HAND SURGERY Left     HX BUNIONECTOMY      HX TUBAL LIGATION       FAMILY HISTORY:  Family Medical History:       Problem Relation (Age of Onset)    Heart Attack Mother    Stomach Cancer Father          SOCIAL HISTORY:  Social History     Socioeconomic History    Marital status: Widowed   Tobacco Use    Smoking status: Former     Packs/day: 1.00     Years: 15.00     Additional pack years: 0.00     Total pack years: 15.00     Types: Cigarettes     Quit date: 74     Years since quitting: 31.0     Passive exposure: Past    Smokeless tobacco: Never   Vaping Use    Vaping Use: Never used   Substance and Sexual Activity    Alcohol use:  Not Currently    Drug use: Never    Sexual activity: Not Currently     Partners: Male     Social Determinants of Health     Social Connections: Medium Risk (07/02/2022)    Social Connections     SDOH Social Isolation: 3 to 5 times a week      ALLERGIES:   Allergies   Allergen Reactions    Influenza Virus Vaccines      CURRENT MEDICATIONS:       DC,Completed & Canceled Medications   (27h ago, onward)                 Ordered        07/02/22 1255  lisinopril-hydrochlorothiazide (ZESTORETIC) 20-12.5mg  per tablet  DAILY,   Status:  Discontinued            07/02/22 1255  azithromycin (ZITHROMAX) 500 mg in NS 250 mL IVPB  EVERY 24 HOURS,   Status:  Discontinued            07/02/22 1255  cefTRIAXone (ROCEPHIN) 2 g in NS 50 mL IVPB minibag  EVERY 24 HOURS,   Status:  Discontinued            07/02/22 1147  heparin 5,000 units/mL initial IV BOLUS  (HEPARIN THROMBOTIC PROTOCOL)  ONCE            07/02/22 1147  heparin 25,000 units in D5W 250 mL infusion  (HEPARIN THROMBOTIC PROTOCOL)  CONTINUOUS,   Status:  Discontinued            07/02/22 1112  iohexol (OMNIPAQUE 350) infusion  GIVE IN RADIOLOGY            07/02/22 1030  magnesium oxide (MAG-OX) 400mg   (240mg  elemental magnesium) tablet  NOW            07/02/22 1059  ipratropium-albuterol 0.5 mg-3 mg(2.5 mg base)/3 mL Solution for Nebulization  NOW                           Current Outpatient Medications   Medication Instructions    albuterol sulfate (PROVENTIL OR VENTOLIN OR PROAIR) 90 mcg/actuation Inhalation oral inhaler 1-2 Puffs, Inhalation, EVERY 6 HOURS PRN    aspirin 81 mg, Oral, DAILY    buPROPion (WELLBUTRIN SR) 150 mg, Oral, 2 TIMES DAILY WITH BREAKFAST & LUNCH    busPIRone (BUSPAR) 10 mg, Oral, 3 TIMES DAILY    citalopram (CELEXA) 40 mg, Oral, DAILY    clonazePAM (KLONOPIN) 1 mg, Oral, 3 TIMES DAILY    fexofenadine (ALLEGRA) 180 mg, Oral, DAILY    gabapentin (NEURONTIN) 300 mg, Oral, 3 TIMES DAILY    ipratropium bromide (ATROVENT) 21 mcg (0.03 %) Nasal nasal spray 2 Sprays, Nasal, 2 TIMES DAILY PRN    levothyroxine (SYNTHROID) 88 mcg, Oral, EVERY MORNING    lisinopriL-hydrochlorothiazide (ZESTORETIC) 20-12.5 mg Oral Tablet 1 Tablet, Oral, DAILY    metoprolol succinate (TOPROL-XL) 25 mg, Oral, DAILY    multivitamin Oral Capsule 1 Capsule, Oral, DAILY    omeprazole (PRILOSEC) 40 mg, Oral, 2 TIMES DAILY    rivaroxaban (XARELTO) 2.5 mg Oral Tablet Take 6 Tablets (15 mg total) by mouth Twice daily for 21 days, THEN 8 Tablets (20 mg total) Every evening with dinner for 30 days.    rivaroxaban (XARELTO) 15 mg, Oral, 2 TIMES DAILY    rivaroxaban (XARELTO) 20 mg, Oral, EVERY EVENING WITH DINNER    SITagliptin phosphate (  JANUVIA) 100 mg, Oral, DAILY    traZODone (DESYREL) 200 mg, Oral, NIGHTLY      PHYSICAL EXAMINATION:  Patient Vitals for the past 24 hrs:   BP Temp Pulse Resp SpO2 Weight   07/04/22 1000 -- -- -- -- 97 % --   07/04/22 0804 (!) 148/67 36.6 C (97.8 F) -- -- -- --   07/04/22 0415 138/64 36.7 C (98.1 F) 56 18 94 % 94.2 kg (207 lb 10.8 oz)   07/04/22 0002 137/62 36.7 C (98.1 F) 67 19 91 % --   07/03/22 2231 -- -- 62 -- -- --   07/03/22 2055 -- 36.5 C (97.7 F) -- -- -- --   07/03/22 2052 -- --  -- -- 94 % --   07/03/22 2000 (!) 142/57 -- 65 20 94 % --   07/03/22 1711 (!) 126/58 36.4 C (97.5 F) 61 19 93 % --   07/03/22 1327 -- -- -- -- 92 % --      ECOG Status: 1  Physical Exam  Constitutional:       Appearance: Normal appearance.   HENT:      Head: Normocephalic and atraumatic.      Nose: Nose normal.      Mouth/Throat:      Mouth: Mucous membranes are moist.      Pharynx: Oropharynx is clear.   Cardiovascular:      Rate and Rhythm: Normal rate and regular rhythm.      Pulses: Normal pulses.      Heart sounds: Normal heart sounds.   Pulmonary:      Effort: Pulmonary effort is normal.      Breath sounds: Normal breath sounds.   Abdominal:      General: Bowel sounds are normal.      Palpations: Abdomen is soft.   Musculoskeletal:         General: Normal range of motion.      Cervical back: Normal range of motion and neck supple.   Skin:     General: Skin is warm and dry.   Neurological:      General: No focal deficit present.      Mental Status: She is alert and oriented to person, place, and time.   Psychiatric:         Mood and Affect: Mood normal.         Behavior: Behavior normal.        LABORATORY DATA:  Results for orders placed or performed during the hospital encounter of 07/02/22 (from the past 24 hour(s))   BASIC METABOLIC PANEL - AM ONCE   Result Value Ref Range    SODIUM 140 136 - 145 mmol/L    POTASSIUM 4.7 3.5 - 5.1 mmol/L    CHLORIDE 105 98 - 107 mmol/L    CO2 TOTAL 29 21 - 31 mmol/L    ANION GAP 6 4 - 13 mmol/L    CALCIUM 9.5 8.6 - 10.3 mg/dL    GLUCOSE 979 (H) 74 - 109 mg/dL    BUN 21 7 - 25 mg/dL    CREATININE 8.92 1.19 - 1.30 mg/dL    BUN/CREA RATIO 19 6 - 22    ESTIMATED GFR 53 (L) >59 mL/min/1.15m^2    OSMOLALITY, CALCULATED 287 270 - 290 mOsm/kg   CBC - AM ONCE   Result Value Ref Range    WBC 15.5 (H) 3.8 - 11.8 x10^3/uL    RBC 4.19 3.63 - 4.92 x10^6/uL  HGB 11.8 10.9 - 14.3 g/dL    HCT 76.2 26.3 - 33.5 %    MCV 85.4 75.5 - 95.3 fL    MCH 28.1 24.7 - 32.8 pg    MCHC 32.9 32.3 - 35.6  g/dL    RDW 45.6 25.6 - 38.9 %    PLATELETS 265 140 - 440 x10^3/uL    MPV 8.3 7.9 - 10.8 fL     IMAGING STUDIES:    No orders to display           ASSESSMENT/RECOMMENDATIONS:   1. 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     Diagnosed with bilateral PE in 2022 after travel initially treated with Apixaban x 9 months then had recurrent PE off of Apixaban in 11/2021 treated with IV Heparin placed back on Apixaban.    A CTA thorax on 11/17/21 showed segmental pulmonary emboli have developed in all 5 lung lobes. A CTA Thorax on 06/17/22 showed improved PE with only equivocal PA filling defects in the right middle lobe and left upper lobe branches. Another CTA Thorax on 07/02/22 showed new bilateral filling defects involving the distal right and left pulmonary arteries, with segmental filling defects bilaterally as well. Clot burden is moderate. Also with B/L LE DVT diagnosed on 07/03/22.    Recurrent PE in 06/2022 represents Apixaban treatment failure since patient reports compliance with medicine. We therefore placed on alternative anticoagulant Rivaroxaban loading dose (15 mg BID x 21 days) then maintenance 20 mg PO daily on 07/04/22 - present.     PLAN  1. Continue with Rivaroxaban 20 mg PO daily.  2. RTC 6 months with CBC, CMP, PT, PTT.    The total amount of time spent on this encounter including review of historical information, labs, direct face-to-face time, discussion of care plan, care coordination and documentation was 30 minutes.    Brayton Mars, MD      DISCLAIMER:  This note was partially created using voice recognition software which is subject to errors that may escape proof reading.  If typographical errors are noted and/or phrasing does not make sense, please contact me for corrections. I reserve the right to change note to correct mistakes related to M*Modal misinterpretation._______________________________________________________________________

## 2022-08-07 ENCOUNTER — Encounter (INDEPENDENT_AMBULATORY_CARE_PROVIDER_SITE_OTHER): Payer: Medicare Other | Admitting: NURSE PRACTITIONER

## 2022-08-28 IMAGING — DX XRAY KNEE [DATE] VIEWS LT
1 series · 1 of 1 positions shown · non-contrast
Comparison: None available.

﻿EXAM:  [id number]   XRAY KNEE [DATE] VIEWS RT,XRAY KNEE [DATE] VIEWS LT, INCLUDING AP WEIGHT-BEARING VIEW:
INDICATION: Bilateral knee pain for 2 years.  Right worse than left.
TECHNIQUE: AP weight-bearing view of both knees. Lateral view of both knees.

[apweightbearing]
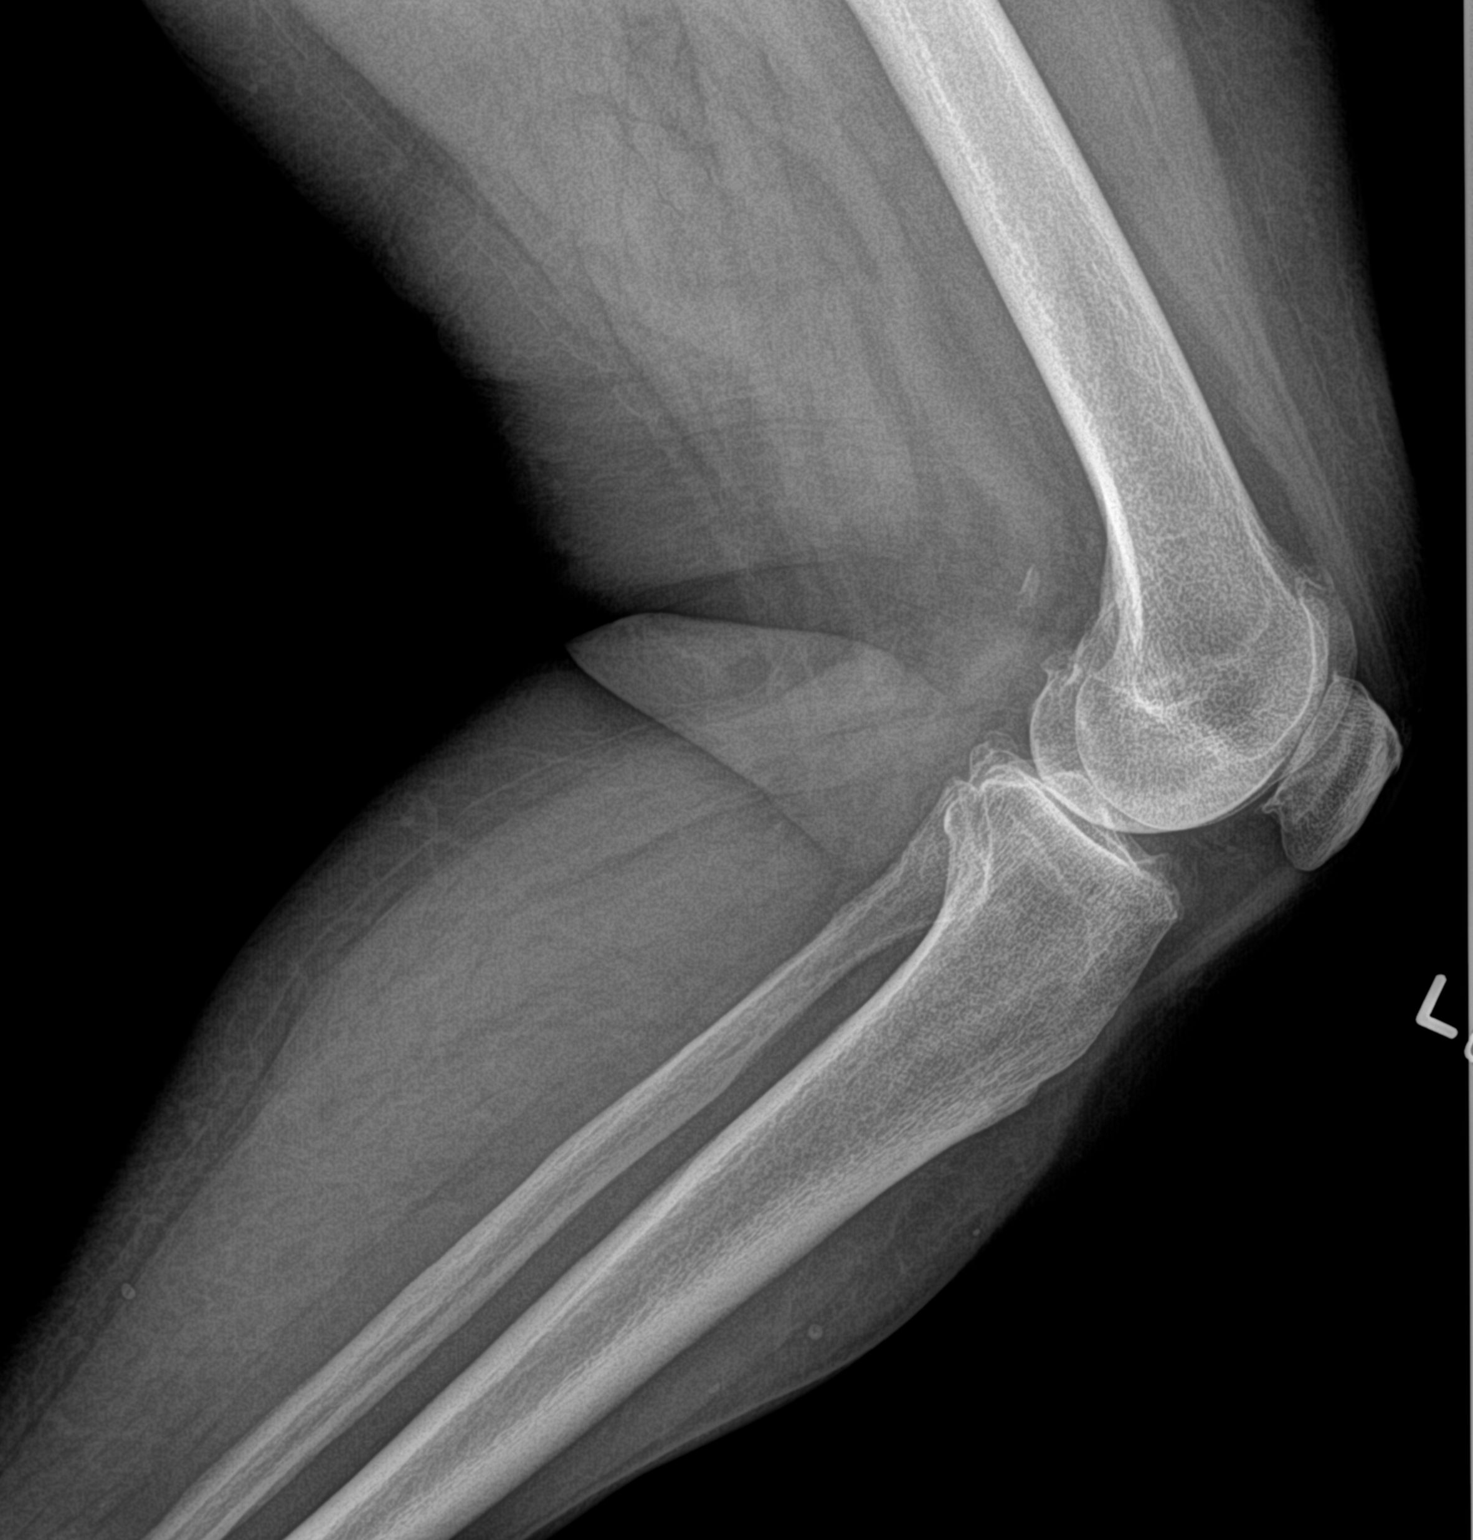

[1 of 1 positions shown; findings below may reference images not displayed]

FINDINGS: Significant, grade 4 degenerative changes of medial compartment of left knee in the AP weight-bearing view.  Moderate grade 3 changes of the medial compartment of right knee.  Grade 2 degenerative changes of lateral compartment of both knee joints and patellofemoral joints are noted.
IMPRESSION: Significant grade 4 degenerative changes of medial compartment of left knee.  Grade 3 degenerative changes of medial compartment of right knee.

## 2022-08-28 IMAGING — DX XRAY KNEE [DATE] VIEWS RT
1 series · 2 of 2 positions shown · non-contrast
Comparison: None available.

﻿EXAM:  [id number]   XRAY KNEE [DATE] VIEWS RT,XRAY KNEE [DATE] VIEWS LT, INCLUDING AP WEIGHT-BEARING VIEW:
INDICATION: Bilateral knee pain for 2 years.  Right worse than left.
TECHNIQUE: AP weight-bearing view of both knees. Lateral view of both knees.

[Series 1: apweightbearing · 0.14mm/px · 2 of 2 slices shown]
[im 1/2]
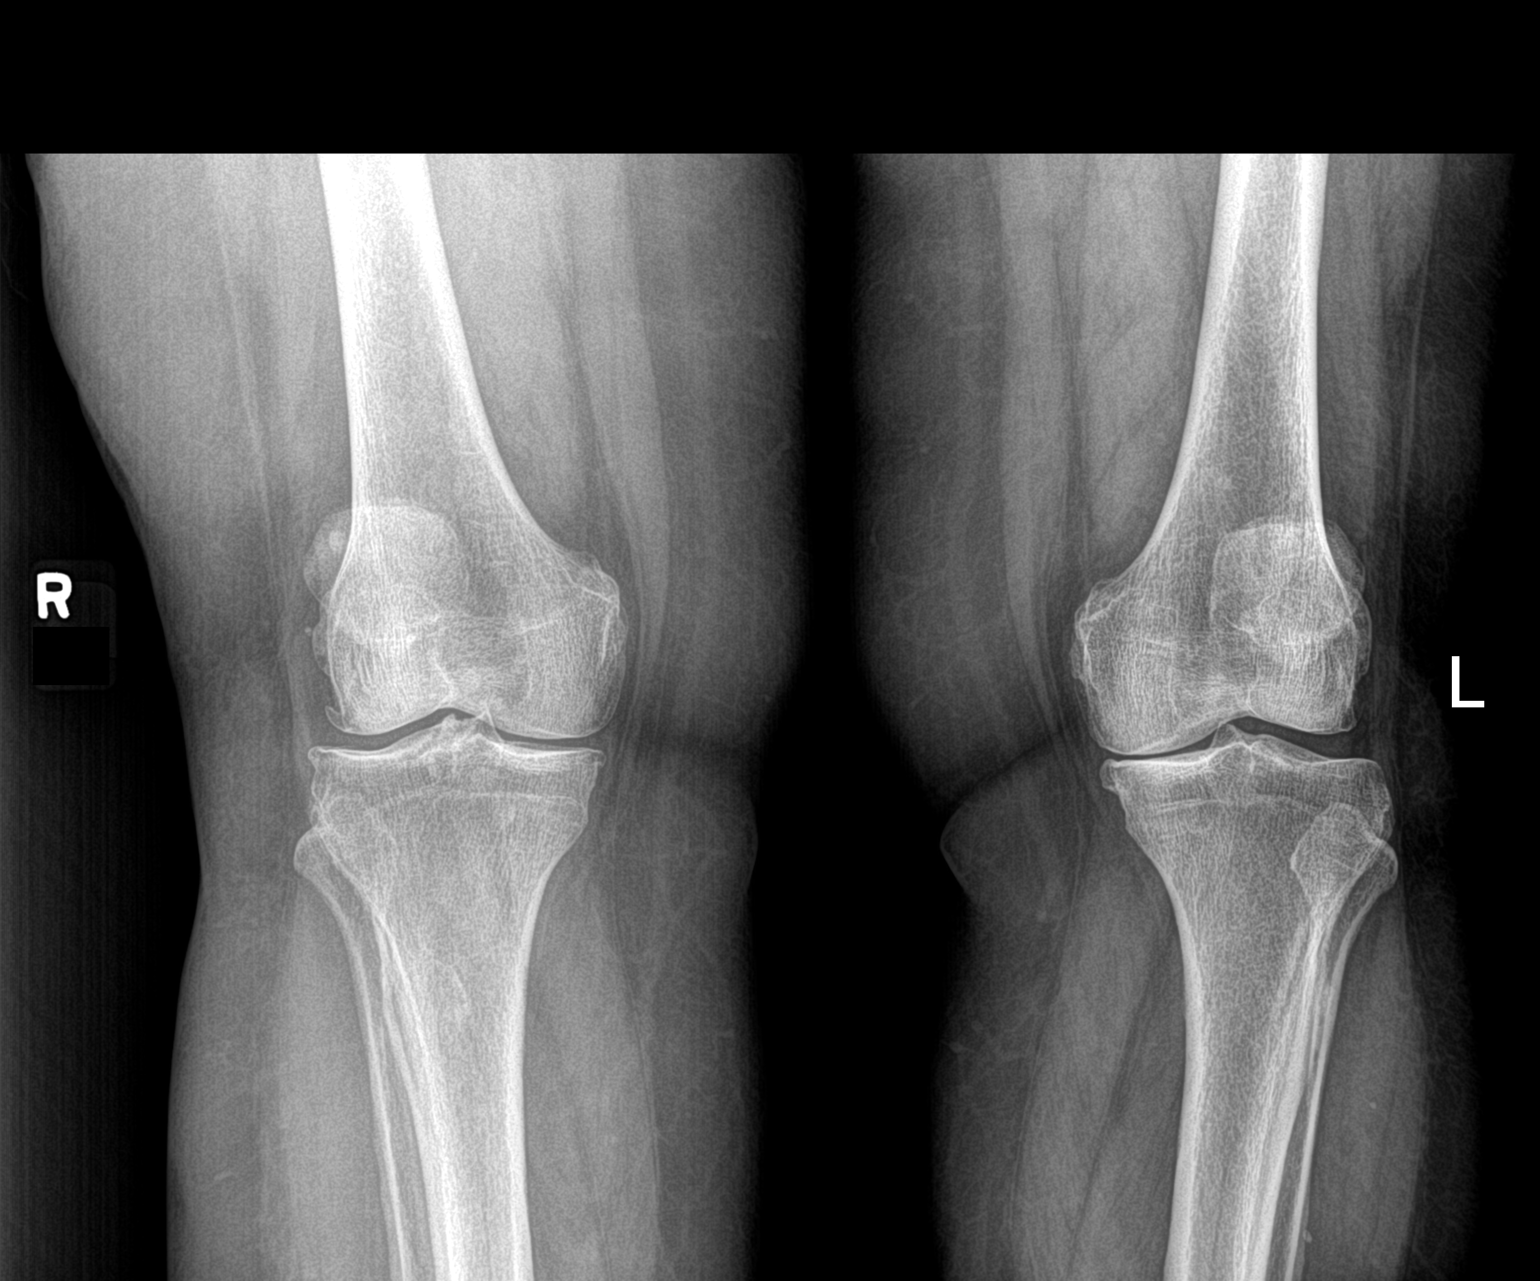
[im 2/2]
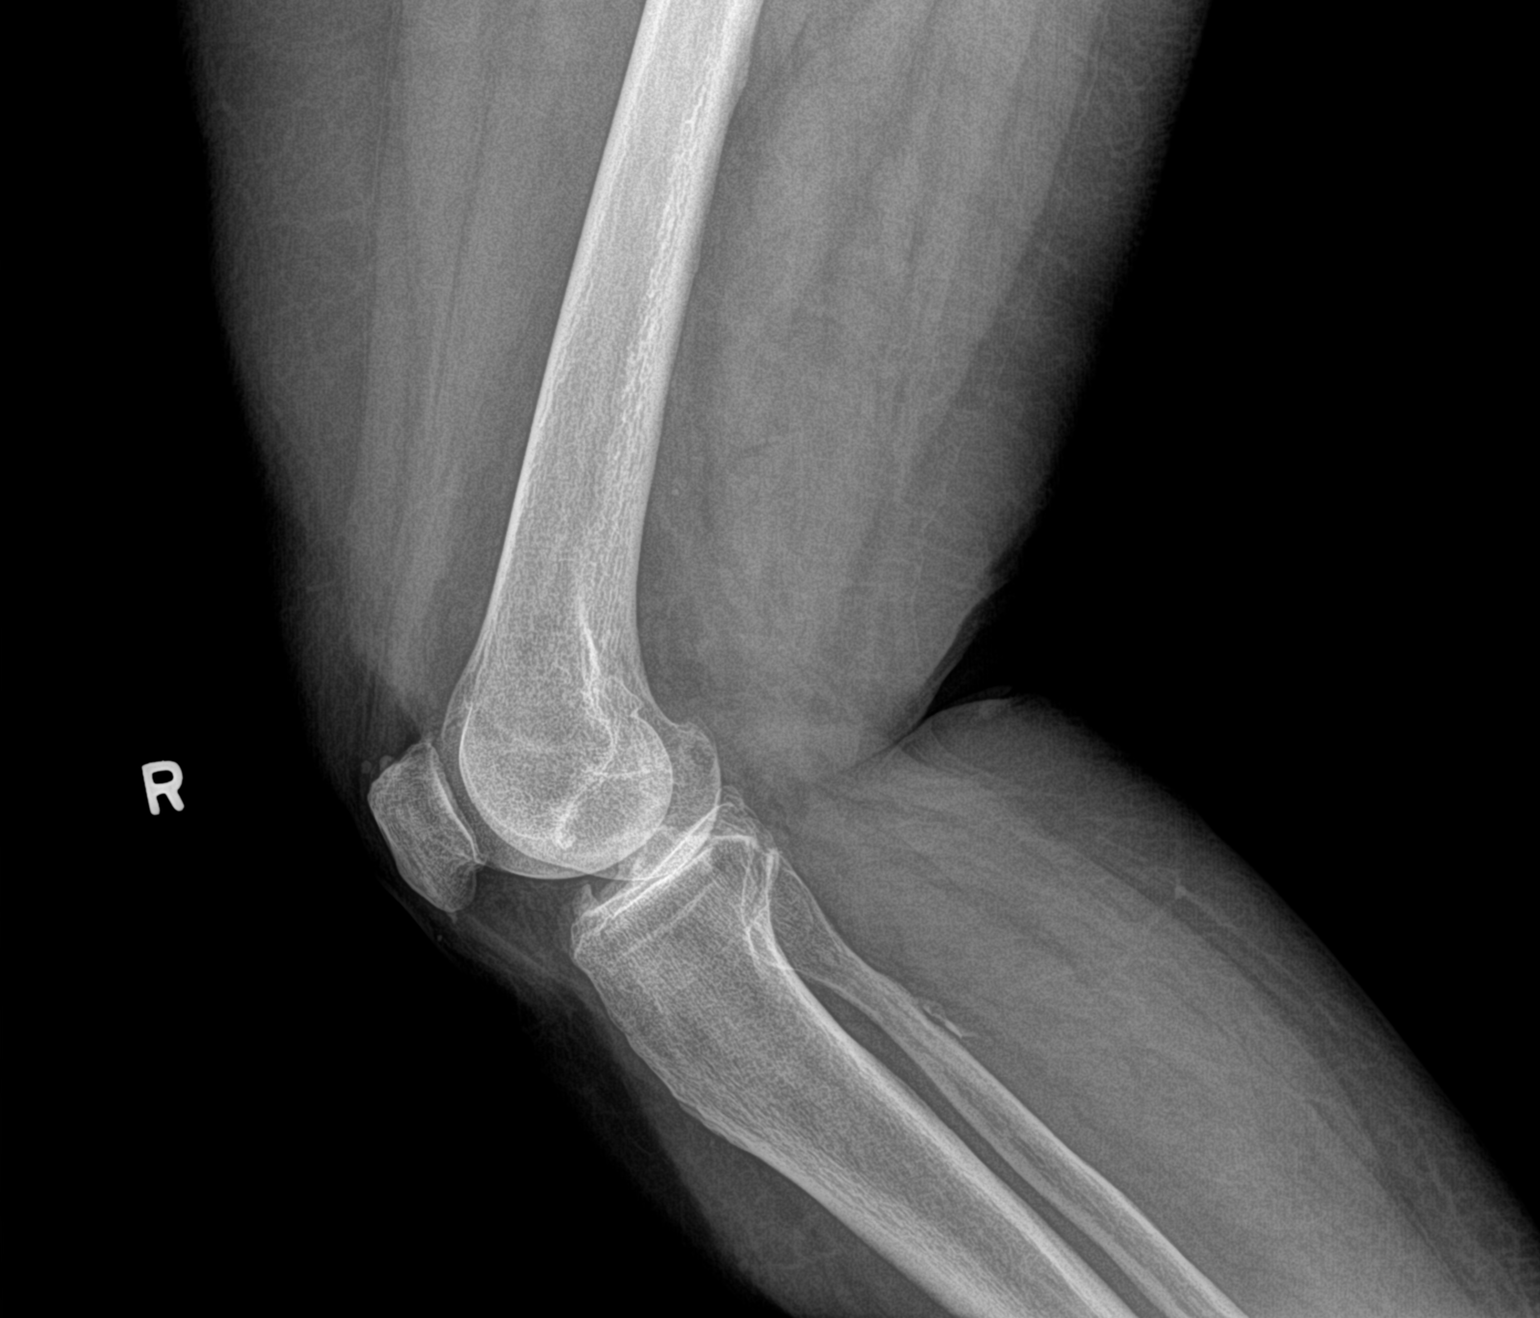

[2 of 2 positions shown; findings below may reference images not displayed]

FINDINGS: Significant, grade 4 degenerative changes of medial compartment of left knee in the AP weight-bearing view.  Moderate grade 3 changes of the medial compartment of right knee.  Grade 2 degenerative changes of lateral compartment of both knee joints and patellofemoral joints are noted.
IMPRESSION: Significant grade 4 degenerative changes of medial compartment of left knee.  Grade 3 degenerative changes of medial compartment of right knee.

## 2023-01-19 ENCOUNTER — Other Ambulatory Visit (HOSPITAL_PSYCHIATRIC): Payer: Self-pay | Admitting: Psychiatry

## 2023-01-19 NOTE — Telephone Encounter (Signed)
No longer under prescriber/prescriber not at this facility

## 2023-01-30 ENCOUNTER — Other Ambulatory Visit: Payer: Medicare Other | Attending: MEDICAL ONCOLOGY

## 2023-01-30 ENCOUNTER — Other Ambulatory Visit: Payer: Self-pay

## 2023-01-30 DIAGNOSIS — I82409 Acute embolism and thrombosis of unspecified deep veins of unspecified lower extremity: Secondary | ICD-10-CM | POA: Insufficient documentation

## 2023-01-30 DIAGNOSIS — I2699 Other pulmonary embolism without acute cor pulmonale: Secondary | ICD-10-CM | POA: Insufficient documentation

## 2023-01-30 LAB — COMPREHENSIVE METABOLIC PANEL, NON-FASTING
ALBUMIN/GLOBULIN RATIO: 1.4 (ref 0.8–1.4)
ALBUMIN: 4.4 g/dL (ref 3.5–5.7)
ALKALINE PHOSPHATASE: 50 U/L (ref 34–104)
ALT (SGPT): 25 U/L (ref 7–52)
ANION GAP: 7 mmol/L (ref 4–13)
AST (SGOT): 22 U/L (ref 13–39)
BILIRUBIN TOTAL: 0.3 mg/dL (ref 0.3–1.2)
BUN/CREA RATIO: 15 (ref 6–22)
BUN: 17 mg/dL (ref 7–25)
CALCIUM, CORRECTED: 9.5 mg/dL (ref 8.9–10.8)
CALCIUM: 9.8 mg/dL (ref 8.6–10.3)
CHLORIDE: 102 mmol/L (ref 98–107)
CO2 TOTAL: 31 mmol/L (ref 21–31)
CREATININE: 1.12 mg/dL (ref 0.60–1.30)
ESTIMATED GFR: 51 mL/min/{1.73_m2} — ABNORMAL LOW (ref >59)
GLOBULIN: 3.1 (ref 2.9–5.4)
GLUCOSE: 67 mg/dL — ABNORMAL LOW (ref 74–109)
OSMOLALITY, CALCULATED: 279 mOsm/kg (ref 270–290)
POTASSIUM: 4.2 mmol/L (ref 3.5–5.1)
PROTEIN TOTAL: 7.5 g/dL (ref 6.4–8.9)
SODIUM: 140 mmol/L (ref 136–145)

## 2023-01-30 LAB — PTT (PARTIAL THROMBOPLASTIN TIME): APTT: 43.6 seconds — ABNORMAL HIGH (ref 25.0–38.0)

## 2023-01-30 LAB — CBC WITH DIFF
BASOPHIL #: 0.1 10*3/uL (ref 0.00–0.10)
BASOPHIL %: 1 % (ref 0–1)
EOSINOPHIL #: 0.2 10*3/uL (ref 0.00–0.50)
EOSINOPHIL %: 2 %
HCT: 37.8 % (ref 31.2–41.9)
HGB: 12.6 g/dL (ref 10.9–14.3)
LYMPHOCYTE #: 2.5 10*3/uL (ref 1.00–3.00)
LYMPHOCYTE %: 29 % (ref 16–44)
MCH: 28.9 pg (ref 24.7–32.8)
MCHC: 33.3 g/dL (ref 32.3–35.6)
MCV: 86.5 fL (ref 75.5–95.3)
MONOCYTE #: 1 10*3/uL (ref 0.30–1.00)
MONOCYTE %: 11 % (ref 5–13)
MPV: 8.3 fL (ref 7.9–10.8)
NEUTROPHIL #: 5 10*3/uL (ref 1.85–7.80)
NEUTROPHIL %: 58 % (ref 43–77)
PLATELETS: 324 10*3/uL (ref 140–440)
RBC: 4.37 10*6/uL (ref 3.63–4.92)
RDW: 13.8 % (ref 12.3–17.7)
WBC: 8.7 10*3/uL (ref 3.8–11.8)

## 2023-01-30 LAB — PT/INR
INR: 1.46 — ABNORMAL HIGH (ref 0.84–1.10)
PROTHROMBIN TIME: 17.1 seconds — ABNORMAL HIGH (ref 9.8–12.7)

## 2023-02-02 ENCOUNTER — Other Ambulatory Visit (HOSPITAL_COMMUNITY): Payer: Self-pay | Admitting: FAMILY PRACTICE

## 2023-02-02 DIAGNOSIS — Z1231 Encounter for screening mammogram for malignant neoplasm of breast: Secondary | ICD-10-CM

## 2023-02-03 ENCOUNTER — Ambulatory Visit: Payer: Medicare Other | Attending: NURSE PRACTITIONER | Admitting: NURSE PRACTITIONER

## 2023-02-03 ENCOUNTER — Other Ambulatory Visit: Payer: Self-pay

## 2023-02-03 ENCOUNTER — Encounter (INDEPENDENT_AMBULATORY_CARE_PROVIDER_SITE_OTHER): Payer: Self-pay | Admitting: NURSE PRACTITIONER

## 2023-02-03 ENCOUNTER — Ambulatory Visit (INDEPENDENT_AMBULATORY_CARE_PROVIDER_SITE_OTHER): Payer: Self-pay

## 2023-02-03 VITALS — BP 127/67 | HR 66 | Temp 97.8°F | Ht 64.0 in | Wt 205.0 lb

## 2023-02-03 DIAGNOSIS — I82409 Acute embolism and thrombosis of unspecified deep veins of unspecified lower extremity: Secondary | ICD-10-CM

## 2023-02-03 DIAGNOSIS — Z7901 Long term (current) use of anticoagulants: Secondary | ICD-10-CM | POA: Insufficient documentation

## 2023-02-03 DIAGNOSIS — Z9981 Dependence on supplemental oxygen: Secondary | ICD-10-CM | POA: Insufficient documentation

## 2023-02-03 DIAGNOSIS — Z86718 Personal history of other venous thrombosis and embolism: Secondary | ICD-10-CM | POA: Insufficient documentation

## 2023-02-03 DIAGNOSIS — J449 Chronic obstructive pulmonary disease, unspecified: Secondary | ICD-10-CM | POA: Insufficient documentation

## 2023-02-03 DIAGNOSIS — I2699 Other pulmonary embolism without acute cor pulmonale: Secondary | ICD-10-CM

## 2023-02-03 DIAGNOSIS — Z09 Encounter for follow-up examination after completed treatment for conditions other than malignant neoplasm: Secondary | ICD-10-CM | POA: Insufficient documentation

## 2023-02-03 DIAGNOSIS — Z86711 Personal history of pulmonary embolism: Secondary | ICD-10-CM | POA: Insufficient documentation

## 2023-02-03 NOTE — Cancer Center Note (Signed)
[place] MEDICINE [facility]  Hematology/Oncology Progress Note     Date of Service:  02/03/2023  Kathleen Munoz   77 y.o. female  Date of Admission:  (Not on file)  Date of Birth:  Feb 13, 1946    DIAGNOSIS: VTE: Recurrent PE and DVT.    HISTORY OF PRESENT ILLNESS:  Kathleen Munoz is a 77 y.o., White female admitted with increasing shortness of breath found to have COPD exacerbation and RSV bronchiolitis and recurrent PE.      She reports was ill for approximately last 7 days with shortness a breath and yellow thick productive cough.  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  She states compliance with Apixaban.      ER workup on 07/02/22 showed pulmonary embolus without evidence of right heart strain on CT scan. Apixaban was held and started on IV Heparin.        HEMATOLOGY/MEDICAL ONCOLOGY HISTORY:   Diagnosed with bilateral PE in 2022 after travel initially treated with Apixaban x 9 months then had recurrent PE off of Apixaban in 11/2021 treated with IV Heparin and placed back on Apixaban.    A CTA thorax on 11/17/21 showed segmental pulmonary emboli have developed in all 5 lung lobes. A CTA Thorax on 06/17/22 showed improved PE with only equivocal PA filling defects in the right middle lobe and left upper lobe branches. Another CTA Thorax on 07/02/22 showed new bilateral filling defects involving the distal right and left pulmonary arteries, with segmental filling defects bilaterally as well. Clot burden is moderate.    REVIEW OF SYSTEMS:   Review of Systems   Constitutional:  Positive for chills and fatigue. Negative for fever.   HENT:   Positive for hearing loss.    Eyes: Negative.    Respiratory:  Positive for cough (dry) and shortness of breath (at times continuous oxygen therapy at 2L Nasal Cannula).    Cardiovascular:  Negative for chest pain and palpitations.   Gastrointestinal:  Positive for abdominal pain  (ocassionally) and diarrhea. Negative for blood in stool, constipation, nausea and vomiting.   Endocrine: Negative for hot flashes.   Genitourinary:  Positive for bladder incontinence. Negative for hematuria.    Musculoskeletal:  Positive for arthralgias (neck, shoulders, knees at times back).   Neurological:  Positive for extremity weakness (at times, fell recently going in the door and lost her balance.) and light-headedness (occassionally with ambulation).   Hematological:  Negative for adenopathy. Bruises/bleeds easily.   Psychiatric/Behavioral:  Positive for depression and sleep disturbance. Negative for suicidal ideas. The patient is nervous/anxious.      PAST MEDICAL HISTORY:  Past Medical History:   Diagnosis Date    Anxiety     Arthritis     Cellulitis and abscess of right lower extremity     Depression     Diabetes mellitus, type 2 (CMS HCC)     Esophageal reflux     H/O blood clots     LUNGS    Hypertension     Major depressive disorder     Personal history of fall     Shortness of breath     Sleep apnea     CPAP    Thyroid disease      PAST SURGICAL HISTORY:  Past Surgical History:   Procedure Laterality Date    HAND SURGERY Left     HX BUNIONECTOMY  HX TUBAL LIGATION       FAMILY HISTORY:  Family Medical History:       Problem Relation (Age of Onset)    Heart Attack Mother    Stomach Cancer Father          SOCIAL HISTORY:  Social History     Socioeconomic History    Marital status: Widowed   Tobacco Use    Smoking status: Former     Current packs/day: 0.00     Average packs/day: 1 pack/day for 15.0 years (15.0 ttl pk-yrs)     Types: Cigarettes     Start date: 38     Quit date: 42     Years since quitting: 31.5     Passive exposure: Past    Smokeless tobacco: Never   Vaping Use    Vaping status: Never Used   Substance and Sexual Activity    Alcohol use: Not Currently    Drug use: Never    Sexual activity: Not Currently     Partners: Male     Social Determinants of Health     Social Connections:  Medium Risk (07/02/2022)    Social Connections     SDOH Social Isolation: 3 to 5 times a week      ALLERGIES:   Allergies   Allergen Reactions    Influenza Virus Vaccines      CURRENT MEDICATIONS:       DC,Completed & Canceled Medications   (27h ago, onward)                 Ordered        07/02/22 1255  lisinopril-hydrochlorothiazide (ZESTORETIC) 20-12.5mg  per tablet  DAILY,   Status:  Discontinued            07/02/22 1255  azithromycin (ZITHROMAX) 500 mg in NS 250 mL IVPB  EVERY 24 HOURS,   Status:  Discontinued            07/02/22 1255  cefTRIAXone (ROCEPHIN) 2 g in NS 50 mL IVPB minibag  EVERY 24 HOURS,   Status:  Discontinued            07/02/22 1147  heparin 5,000 units/mL initial IV BOLUS  (HEPARIN THROMBOTIC PROTOCOL)  ONCE            07/02/22 1147  heparin 25,000 units in D5W 250 mL infusion  (HEPARIN THROMBOTIC PROTOCOL)  CONTINUOUS,   Status:  Discontinued            07/02/22 1112  iohexol (OMNIPAQUE 350) infusion  GIVE IN RADIOLOGY            07/02/22 1030  magnesium oxide (MAG-OX) 400mg  (240mg  elemental magnesium) tablet  NOW            07/02/22 1059  ipratropium-albuterol 0.5 mg-3 mg(2.5 mg base)/3 mL Solution for Nebulization  NOW                           Current Outpatient Medications   Medication Instructions    albuterol sulfate (PROVENTIL OR VENTOLIN OR PROAIR) 90 mcg/actuation Inhalation oral inhaler 1-2 Puffs, Inhalation, EVERY 6 HOURS PRN    aspirin 81 mg, Oral, DAILY    buPROPion (WELLBUTRIN SR) 150 mg, Oral, 2 TIMES DAILY WITH BREAKFAST & LUNCH    busPIRone (BUSPAR) 10 mg, Oral, 3 TIMES DAILY    citalopram (CELEXA) 40 mg, Oral, DAILY    clonazePAM (KLONOPIN) 1 mg, Oral, 3 TIMES  DAILY    fexofenadine (ALLEGRA) 180 mg, Oral, DAILY    fluticasone propionate (FLONASE) 50 mcg/actuation Nasal Spray, Suspension SPRAY 2 SPRAY(S) (NASAL) 1 TIME PER DAY IN EACH NOSTRIL FOR ALLERGY SYMPTOMS    gabapentin (NEURONTIN) 300 mg, Oral, 3 TIMES DAILY    ipratropium bromide (ATROVENT) 21 mcg (0.03 %) Nasal  nasal spray 2 Sprays, Nasal, 2 TIMES DAILY PRN    levothyroxine (SYNTHROID) 88 mcg, Oral, EVERY MORNING    lisinopriL-hydrochlorothiazide (ZESTORETIC) 20-12.5 mg Oral Tablet 1 Tablet, Oral, DAILY    metoprolol succinate (TOPROL-XL) 25 mg, Oral, DAILY    multivitamin Oral Capsule 1 Capsule, Oral, DAILY    omeprazole (PRILOSEC) 40 mg, Oral, 2 TIMES DAILY    rivaroxaban (XARELTO) 20 mg, Oral, EVERY EVENING WITH DINNER    SITagliptin phosphate (JANUVIA) 100 mg, Oral, DAILY    traZODone (DESYREL) 200 mg, Oral, NIGHTLY      PHYSICAL EXAMINATION:  Patient Vitals for the past 24 hrs:   BP Temp Pulse Resp SpO2 Weight   07/04/22 1000 -- -- -- -- 97 % --   07/04/22 0804 (!) 148/67 36.6 C (97.8 F) -- -- -- --   07/04/22 0415 138/64 36.7 C (98.1 F) 56 18 94 % 94.2 kg (207 lb 10.8 oz)   07/04/22 0002 137/62 36.7 C (98.1 F) 67 19 91 % --   07/03/22 2231 -- -- 62 -- -- --   07/03/22 2055 -- 36.5 C (97.7 F) -- -- -- --   07/03/22 2052 -- -- -- -- 94 % --   07/03/22 2000 (!) 142/57 -- 65 20 94 % --   07/03/22 1711 (!) 126/58 36.4 C (97.5 F) 61 19 93 % --   07/03/22 1327 -- -- -- -- 92 % --      ECOG Status: 1  Physical Exam  Constitutional:       Appearance: Normal appearance.   HENT:      Head: Normocephalic and atraumatic.      Right Ear: External ear normal.      Left Ear: External ear normal.      Nose: Nose normal. No congestion or rhinorrhea.      Mouth/Throat:      Mouth: Mucous membranes are moist.      Pharynx: Oropharynx is clear. No oropharyngeal exudate or posterior oropharyngeal erythema.   Eyes:      General: No scleral icterus.        Right eye: No discharge.         Left eye: No discharge.      Extraocular Movements: Extraocular movements intact.      Conjunctiva/sclera: Conjunctivae normal.      Pupils: Pupils are equal, round, and reactive to light.   Cardiovascular:      Rate and Rhythm: Normal rate and regular rhythm.      Pulses: Normal pulses.      Heart sounds: Murmur heard.   Pulmonary:      Breath  sounds: Normal breath sounds. No wheezing or rhonchi.      Comments: On Oxygen therapy 2L N/C   Abdominal:      General: Bowel sounds are normal.      Palpations: Abdomen is soft.   Musculoskeletal:         General: Normal range of motion.      Cervical back: Normal range of motion and neck supple.      Right lower leg: Edema present.      Left  lower leg: Edema present.   Skin:     General: Skin is warm and dry.      Capillary Refill: Capillary refill takes 2 to 3 seconds.      Findings: Bruising present. No lesion.   Neurological:      General: No focal deficit present.      Mental Status: She is alert and oriented to person, place, and time. Mental status is at baseline.      Motor: Weakness present.      Gait: Gait abnormal (use of walker).   Psychiatric:         Mood and Affect: Mood normal.         Behavior: Behavior normal.        LABORATORY DATA:  LABS:   CBC  Diff   Lab Results   Component Value Date/Time    WBC 8.7 01/30/2023 02:30 PM    HGB 12.6 01/30/2023 02:30 PM    HCT 37.8 01/30/2023 02:30 PM    PLTCNT 324 01/30/2023 02:30 PM    RBC 4.37 01/30/2023 02:30 PM    MCV 86.5 01/30/2023 02:30 PM    MCHC 33.3 01/30/2023 02:30 PM    MCH 28.9 01/30/2023 02:30 PM    RDW 13.8 01/30/2023 02:30 PM    MPV 8.3 01/30/2023 02:30 PM    Lab Results   Component Value Date/Time    PMNS 58 01/30/2023 02:30 PM    LYMPHOCYTES 29 01/30/2023 02:30 PM    EOSINOPHIL 2 01/30/2023 02:30 PM    MONOCYTES 11 01/30/2023 02:30 PM    BASOPHILS 1 01/30/2023 02:30 PM    BASOPHILS 0.10 01/30/2023 02:30 PM    PMNABS 5.00 01/30/2023 02:30 PM    LYMPHSABS 2.50 01/30/2023 02:30 PM    EOSABS 0.20 01/30/2023 02:30 PM    MONOSABS 1.00 01/30/2023 02:30 PM            Comprehensive Metabolic Profile    Lab Results   Component Value Date    SODIUM 140 01/30/2023    POTASSIUM 4.2 01/30/2023    CHLORIDE 102 01/30/2023    CO2 31 01/30/2023    ANIONGAP 7 01/30/2023    BUN 17 01/30/2023    CREATININE 1.12 01/30/2023    ALBUMIN 4.4 01/30/2023    CALCIUM 9.8  01/30/2023    GLUCOSENF 67 (L) 01/30/2023    ALKPHOS 50 01/30/2023    ALT 25 01/30/2023    AST 22 01/30/2023    TOTBILIRUBIN 0.3 01/30/2023    TOTALPROTEIN 7.5 01/30/2023    GFR 51 (L) 01/30/2023     01/30/2023 PT/INR 17.1/1.46, aPTT 43.6         IMAGING STUDIES:    No orders to display           ASSESSMENT/RECOMMENDATIONS:   1. 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     Diagnosed with bilateral PE in 2022 after travel initially treated with Apixaban x 9 months then had recurrent PE off of Apixaban in 11/2021 treated with IV Heparin placed back on Apixaban.    A CTA thorax on 11/17/21 showed segmental pulmonary emboli have developed in all 5 lung lobes. A CTA Thorax on 06/17/22 showed improved PE with only equivocal PA filling defects in the right middle lobe and left upper lobe branches. Another CTA Thorax on 07/02/22 showed new bilateral filling defects involving the distal right and left pulmonary arteries, with segmental filling defects bilaterally as  well. Clot burden is moderate. Also with B/L LE DVT diagnosed on 07/03/22.    Recurrent PE in 06/2022 represents Apixaban treatment failure since patient reports compliance with medicine. We therefore placed on alternative anticoagulant Rivaroxaban loading dose (15 mg BID x 21 days) then maintenance 20 mg PO daily on 07/04/22 - present.     PLAN  1. Continue with Rivaroxaban 20 mg PO daily indefinitely, may consider coumadin therapy for any recurrent VTE. RX for Rivaroxaban obtained by PCP.   2. Follow up on an as needed basis    The patient was given the opportunity to ask questions, and these were answered to their satisfaction. They are welcome to call with any questions or concerns at any point.      On the day of the encounter, I spent a total of  39 minutes on this patient encounter including review of historical information, examination, documentation and post-visit activities.       Evie Lacks APRN, FNP-C  7/23/202413:53      DISCLAIMER:  This note was partially created using voice recognition software which is subject to errors that may escape proof reading.  If typographical errors are noted and/or phrasing does not make sense, please contact me for corrections. I reserve the right to change note to correct mistakes related to M*Modal misinterpretation._______________________________________________________________________

## 2023-02-12 ENCOUNTER — Ambulatory Visit (HOSPITAL_COMMUNITY): Payer: Self-pay

## 2023-03-19 ENCOUNTER — Other Ambulatory Visit: Payer: Self-pay

## 2023-03-19 ENCOUNTER — Inpatient Hospital Stay
Admission: RE | Admit: 2023-03-19 | Discharge: 2023-03-19 | Disposition: A | Discharge status: Home / Self Care | Payer: Medicare Other | Source: Ambulatory Visit | Attending: FAMILY PRACTICE | Admitting: FAMILY PRACTICE

## 2023-03-19 ENCOUNTER — Encounter (HOSPITAL_COMMUNITY): Payer: Self-pay

## 2023-03-19 DIAGNOSIS — Z1231 Encounter for screening mammogram for malignant neoplasm of breast: Secondary | ICD-10-CM | POA: Insufficient documentation

## 2023-06-18 ENCOUNTER — Other Ambulatory Visit: Payer: Self-pay

## 2023-06-18 ENCOUNTER — Ambulatory Visit: Payer: Medicare Other | Attending: NURSE PRACTITIONER | Admitting: NURSE PRACTITIONER

## 2023-06-18 ENCOUNTER — Encounter (INDEPENDENT_AMBULATORY_CARE_PROVIDER_SITE_OTHER): Payer: Self-pay | Admitting: NURSE PRACTITIONER

## 2023-06-18 VITALS — Ht 64.0 in | Wt 203.0 lb

## 2023-06-18 DIAGNOSIS — J3089 Other allergic rhinitis: Secondary | ICD-10-CM | POA: Insufficient documentation

## 2023-06-18 DIAGNOSIS — Z7722 Contact with and (suspected) exposure to environmental tobacco smoke (acute) (chronic): Secondary | ICD-10-CM

## 2023-06-18 DIAGNOSIS — H6991 Unspecified Eustachian tube disorder, right ear: Secondary | ICD-10-CM | POA: Insufficient documentation

## 2023-06-18 DIAGNOSIS — Z87891 Personal history of nicotine dependence: Secondary | ICD-10-CM

## 2023-06-18 DIAGNOSIS — H9190 Unspecified hearing loss, unspecified ear: Secondary | ICD-10-CM | POA: Insufficient documentation

## 2023-06-18 MED ORDER — DESLORATADINE 5 MG TABLET
5.0000 mg | ORAL_TABLET | Freq: Every day | ORAL | 5 refills | Status: DC
Start: 2023-06-18 — End: 2023-10-12

## 2023-06-18 MED ORDER — IPRATROPIUM BROMIDE 21 MCG (0.03 %) NASAL SPRAY
2.0000 | Freq: Two times a day (BID) | NASAL | 12 refills | Status: DC | PRN
Start: 2023-06-18 — End: 2023-07-21

## 2023-06-18 NOTE — Procedures (Signed)
[facility]  [address]  [place] [address]  Operated by [facility]  Procedure Note    Name: AZURI YA MRN:  [id number]   Date: 06/18/2023 DOB:  09/22/1945 (77 y.o.)         31231 - NASAL ENDOSCOPY DIAGNOSTIC UNILATERAL OR BILATERAL (AMB ONLY)    Performed by: Elnora Morrison, FNP-BC  Authorized by: Elnora Morrison, FNP-BC    Time Out:     Immediately before the procedure, a time out was called:  Yes    Patient verified:  Yes    Procedure Verified:  Yes    Site Verified:  Yes  Documentation:      [facility]  [address]  [place] [address]  Operated by [facility]  Procedure Note    Name: ARSIE PARADOWSKI MRN:  [id number]  Date: 06/18/2023 DOB:  05-12-1946 (77 y.o.)        @PROCDOC @    Indications for procedure: Obstructive nasal breathing and Unilateral middle ear effusion    Anesthesia: None    Description: Nasal endoscopy with flexible scope was performed with examination of the  septum, inferior, middle, and superior meatus, turbinates, sphenoethmoidal recess, and nasopharynx.     There were no polyps, pus, or granulation tissue noted.  ET orifices and nasopharynx were normal.     Findings: Inferior turbinate hypertrophy and Allergic rhinitis    The patient tolerated the procedure well.    Elnora Morrison, FNP-BC             Elnora Morrison, FNP-BC

## 2023-06-18 NOTE — H&P (Signed)
[facility]  [address]  [place] [address]  Phone: [phone]  Fax: [phone]      Encounter Date: 06/18/2023    Patient ID: Kathleen Munoz  MRN: [id number]    DOB: 03/14/1946  Age: 77 y.o. female         Referring Provider:    Madolyn Frieze, MD  [address]  [place],  [place] [address]    Reason for Visit:   Chief Complaint   Patient presents with    Hearing Loss     Pt complains of hearing loss R>L x several months         History of Present Illness:  Kathleen Munoz is a 77 y.o. female referred for hearing loss.  She reports bilateral hearing loss for several years, but over the past few months hearing has decreased in the right ear with fullness and popping at times.  Also having increased allergic rhinitis symptoms.  Requests refill on Atrovent nasal spray      Patient History:  Patient Active Problem List   Diagnosis    Bilateral pulmonary embolism (CMS HCC)    Sleep apnea    Diabetes mellitus, type 2 (CMS HCC)    RSV (acute bronchiolitis due to respiratory syncytial virus)    COPD exacerbation (CMS HCC)    DVT (deep venous thrombosis) (CMS HCC)    Current use of long term anticoagulation     Current Outpatient Medications   Medication Sig    albuterol sulfate (PROVENTIL OR VENTOLIN OR PROAIR) 90 mcg/actuation Inhalation oral inhaler Take 1-2 Puffs by inhalation Every 6 hours as needed for Other (SOB)    aspirin 81 mg Oral Tablet, Chewable Chew 1 Tablet (81 mg total) Once a day    buPROPion (WELLBUTRIN SR) 150 mg Oral tablet sustained-release 12 hr Take 1 Tablet (150 mg total) by mouth Twice daily with breakfast and lunch    busPIRone (BUSPAR) 10 mg Oral Tablet Take 1 Tablet (10 mg total) by mouth Three times a day    clonazePAM (KLONOPIN) 1 mg Oral Tablet Take 1 Tablet (1 mg total) by mouth Three times a day    desloratadine (CLARINEX) 5 mg Oral Tablet Take 1 Tablet (5 mg total) by mouth Once a day    fluticasone propionate (FLONASE) 50 mcg/actuation Nasal Spray, Suspension  SPRAY 2 SPRAY(S) (NASAL) 1 TIME PER DAY IN EACH NOSTRIL FOR ALLERGY SYMPTOMS    gabapentin (NEURONTIN) 400 mg Oral Capsule Take 1 Capsule (400 mg total) by mouth Three times a day    ipratropium bromide (ATROVENT) 21 mcg (0.03 %) Nasal nasal spray Administer 2 Sprays into affected nostril(s) Twice per day as needed    levothyroxine (SYNTHROID) 88 mcg Oral Tablet Take 1 Tablet (88 mcg total) by mouth Every morning    lisinopriL-hydrochlorothiazide (ZESTORETIC) 20-12.5 mg Oral Tablet Take 1 Tablet by mouth Once a day    multivitamin Oral Capsule Take 1 Capsule by mouth Once a day    omeprazole (PRILOSEC) 40 mg Oral Capsule, Delayed Release(E.C.) Take 1 Capsule (40 mg total) by mouth Twice daily    rivaroxaban (XARELTO) 20 mg Oral Tablet Take 1 Tablet (20 mg total) by mouth Every evening with dinner    SITagliptin phosphate (JANUVIA) 100 mg Oral Tablet Take 1 Tablet (100 mg total) by mouth Once a day    traZODone (DESYREL) 100 mg Oral Tablet Take 2 Tablets (200 mg total) by mouth Every night as needed  Allergies   Allergen Reactions    Influenza Virus Vaccines      Past Medical History:   Diagnosis Date    Anxiety     Arthritis     Cellulitis and abscess of right lower extremity     Depression     Diabetes mellitus, type 2 (CMS HCC)     Esophageal reflux     H/O blood clots     LUNGS    Hypertension     Major depressive disorder     Personal history of fall     Shortness of breath     Sleep apnea     CPAP    Thyroid disease       Past Surgical History:   Procedure Laterality Date    HAND SURGERY Left     HX BUNIONECTOMY      HX TUBAL LIGATION        Family Medical History:       Problem Relation (Age of Onset)    Heart Attack Mother    No Known Problems Sister, Brother, Maternal Grandmother, Maternal Grandfather, Paternal Grandmother, Paternal Grandfather, Daughter, Son, Maternal Aunt, Maternal Uncle, Paternal Aunt, Paternal Uncle, Other    Stomach Cancer Father            Social History     Tobacco Use    Smoking  status: Former     Current packs/day: 0.00     Average packs/day: 1 pack/day for 15.0 years (15.0 ttl pk-yrs)     Types: Cigarettes     Start date: 109     Quit date: 1993     Years since quitting: 31.9     Passive exposure: Past    Smokeless tobacco: Never   Vaping Use    Vaping status: Never Used   Substance Use Topics    Alcohol use: Not Currently    Drug use: Never       Review of Systems     Vitals:    06/18/23 1437   Weight: 92.1 kg (203 lb)   Height: 1.626 m (5\' 4" )   BMI: 34.84      ENT Physical Exam  Constitutional  Appearance: patient appears well-developed and well-groomed, obesity noted,  Communication/Voice: communication appropriate for developmental age; vocal quality normal;  Head and Face  Appearance: head appears normal, face appears normal and face appears atraumatic;  Palpation: facial palpation normal;  Salivary: glands normal;  Ear  Hearing: intact;  Auricles: right auricle normal; left auricle normal;  External Mastoids: right external mastoid normal; left external mastoid normal;  Ear Canals: right ear canal normal; left ear canal normal;  Tympanic Membranes: left tympanic membrane normal;  Ear comments: Right middle ear effusion  Nose  External Nose: nares patent bilaterally; nasal discharge visible;  Internal Nose: nasal mucosa normal; septum normal; bilateral inferior turbinates with hypertrophy;  Oral Cavity/Oropharynx  Lips: normal;  Teeth: dentures noted;  Gums: gingiva normal;  Tongue: normal;  Oral mucosa: normal;  Hard palate: normal;  Neck  Neck: neck normal; neck palpation normal;  Thyroid: thyroid normal;  Respiratory  Inspection: breathing unlabored; normal breathing rate;  Lymphatic  Palpation: no cervical adenopathy noted;  Neurovestibular  Mental Status: alert and oriented;  Psychiatric: mood normal; affect is appropriate;  Cranial Nerves: cranial nerves intact;       Assessment:  ENCOUNTER DIAGNOSES     ICD-10-CM   1. Hearing loss, unspecified hearing loss type, unspecified  laterality  H91.90  2. Dysfunction of right eustachian tube  H69.91   3. Non-seasonal allergic rhinitis, unspecified trigger  J30.89       Plan:  Medical records reviewed on 06/18/2023.  Right middle ear effusion present.  Discussed risks and benefits of RMT with patient.  She is agreeable.  Audiogram scheduled and will follow up with Dr. Quintin Alto for RMT  NP exam normal  Will change antihistamine to desloratadine 5 mg daily and continue Atrovent nasal spray    Orders Placed This Encounter    31231 - NASAL ENDOSCOPY DIAGNOSTIC UNILATERAL OR BILATERAL (AMB ONLY)    [facility]    desloratadine (CLARINEX) 5 mg Oral Tablet    ipratropium bromide (ATROVENT) 21 mcg (0.03 %) Nasal nasal spray     No follow-ups on file.    Elnora Morrison, FNP-BC  06/18/2023, 14:50

## 2023-07-03 ENCOUNTER — Other Ambulatory Visit: Payer: Self-pay

## 2023-07-03 ENCOUNTER — Encounter (INDEPENDENT_AMBULATORY_CARE_PROVIDER_SITE_OTHER): Payer: Self-pay | Admitting: OTOLARYNGOLOGY

## 2023-07-03 ENCOUNTER — Ambulatory Visit: Payer: Medicare Other | Attending: OTOLARYNGOLOGY | Admitting: OTOLARYNGOLOGY

## 2023-07-03 VITALS — Ht 64.0 in | Wt 203.0 lb

## 2023-07-03 DIAGNOSIS — H9191 Unspecified hearing loss, right ear: Secondary | ICD-10-CM

## 2023-07-03 DIAGNOSIS — H9190 Unspecified hearing loss, unspecified ear: Secondary | ICD-10-CM | POA: Insufficient documentation

## 2023-07-03 DIAGNOSIS — J309 Allergic rhinitis, unspecified: Secondary | ICD-10-CM

## 2023-07-03 DIAGNOSIS — J3089 Other allergic rhinitis: Secondary | ICD-10-CM | POA: Insufficient documentation

## 2023-07-03 DIAGNOSIS — J343 Hypertrophy of nasal turbinates: Secondary | ICD-10-CM | POA: Insufficient documentation

## 2023-07-03 DIAGNOSIS — H6991 Unspecified Eustachian tube disorder, right ear: Secondary | ICD-10-CM | POA: Insufficient documentation

## 2023-07-03 MED ORDER — OFLOXACIN 0.3 % EAR DROPS
5.0000 [drp] | Freq: Two times a day (BID) | OTIC | 0 refills | Status: AC
Start: 2023-07-03 — End: 2023-07-10

## 2023-07-03 NOTE — Procedures (Signed)
[facility]  [address]  [place] [address]  Operated by [facility]  Procedure Note    Name: JERALYN FASCHING MRN:  [id number]   Date: 07/03/2023 DOB:  21-May-1946 (77 y.o.)         [phone] - TYMPANOSTOMY (REQUIRING INSERTION OF VENTILATING TUBE), LOCAL/TOPICAL ANESTHESIA (AMB ONLY)    Performed by: Lonia Farber, DO  Authorized by: Lonia Farber, DO    Time Out:     Immediately before the procedure, a time out was called:  Yes    Patient verified:  Yes    Procedure Verified:  Yes    Site Verified:  Yes  Preoperative Diagnosis:  Right eustachian tube dysfunction, right chronic otitis media      Postoperative Diagnoses:  Same     Procedure Performed:  Right myringotomy with tube placement     Attending Surgeon: Lonia Farber, DO      Anesthesia Type:  Topical/local     Estimated Blood Loss:  Minimal     Ventilation tube type:  Paparella     Complications:  None immediate        Description of Procedure:  After informed consent was reviewed and confirmed. Patient was prepped and draped in a standard semi-sterile ENT fashion. With the aid of a binocular microscope the right external auditory canal was examined after placement of a ear speculum, a cerumen curette was used to debride obstructing cerumen. The visualized tympanic membrane revealed findings consistent with the after mentioned diagnosis.  Topical phenol was applied to the anterior inferior portion of the drum.  An anterior inferior radial incision was made using a myringotomy knife. Middle ear fluid was suctioned free with a #3 suction. The myringotomy tube was placed within the myringotomy incision with the alligator forceps. Floxin otic drops were instilled into the EAC, middle ear penetration was aided with a tragal pump. A cottonball was then placed into the EAC.  Patient tolerated procedure well           Lonia Farber, DO

## 2023-07-03 NOTE — H&P (Signed)
[facility]  [address]  [place] [address]  Operated by [facility]      Name: Kathleen Munoz MRN:  [id number]   Date: 07/03/2023 DOB: September 03, 1945 (77 y.o.)       Referring Provider:  Madolyn Frieze, MD    Reason for Visit:   Chief Complaint   Patient presents with    Ear Problem(s)     Complains of fullness right ear. Here for possible RMT        History of Present Illness:  Kathleen Munoz is a 77 y.o. female who presents for follow-up on hearing loss/Eustachian tube dysfunction/AR.  Referred by Missy NP for possible right tube placement.  Still complaining of right aural fullness and decreased hearing.  On Flonase and Clarinex daily                Patient History:  Patient Active Problem List   Diagnosis    Bilateral pulmonary embolism (CMS HCC)    Sleep apnea    Diabetes mellitus, type 2 (CMS HCC)    RSV (acute bronchiolitis due to respiratory syncytial virus)    COPD exacerbation (CMS HCC)    DVT (deep venous thrombosis) (CMS HCC)    Current use of long term anticoagulation     Current Outpatient Medications   Medication Sig    albuterol sulfate (PROVENTIL OR VENTOLIN OR PROAIR) 90 mcg/actuation Inhalation oral inhaler Take 1-2 Puffs by inhalation Every 6 hours as needed for Other (SOB)    aspirin 81 mg Oral Tablet, Chewable Chew 1 Tablet (81 mg total) Once a day    buPROPion (WELLBUTRIN SR) 150 mg Oral tablet sustained-release 12 hr Take 1 Tablet (150 mg total) by mouth Twice daily with breakfast and lunch    busPIRone (BUSPAR) 10 mg Oral Tablet Take 1 Tablet (10 mg total) by mouth Three times a day    clonazePAM (KLONOPIN) 1 mg Oral Tablet Take 1 Tablet (1 mg total) by mouth Three times a day    desloratadine (CLARINEX) 5 mg Oral Tablet Take 1 Tablet (5 mg total) by mouth Once a day    fluticasone propionate (FLONASE) 50 mcg/actuation Nasal Spray, Suspension SPRAY 2 SPRAY(S) (NASAL) 1 TIME PER DAY IN EACH NOSTRIL FOR ALLERGY SYMPTOMS    gabapentin (NEURONTIN)  400 mg Oral Capsule Take 1 Capsule (400 mg total) by mouth Three times a day    ipratropium bromide (ATROVENT) 21 mcg (0.03 %) Nasal nasal spray Administer 2 Sprays into affected nostril(s) Twice per day as needed    levothyroxine (SYNTHROID) 88 mcg Oral Tablet Take 1 Tablet (88 mcg total) by mouth Every morning    lisinopriL-hydrochlorothiazide (ZESTORETIC) 20-12.5 mg Oral Tablet Take 1 Tablet by mouth Once a day    multivitamin Oral Capsule Take 1 Capsule by mouth Once a day    ofloxacin (FLOXIN) 0.3 % Otic Drops Otic Solution Administer 5 Drops into the right ear Twice daily for 7 days    omeprazole (PRILOSEC) 40 mg Oral Capsule, Delayed Release(E.C.) Take 1 Capsule (40 mg total) by mouth Twice daily    rivaroxaban (XARELTO) 20 mg Oral Tablet Take 1 Tablet (20 mg total) by mouth Every evening with dinner    SITagliptin phosphate (JANUVIA) 100 mg Oral Tablet Take 1 Tablet (100 mg total) by mouth Once a day    traZODone (DESYREL) 100 mg Oral Tablet Take 2 Tablets (200 mg total) by mouth Every night as needed  Allergies   Allergen Reactions    Influenza Virus Vaccines      Past Medical History:   Diagnosis Date    Anxiety     Arthritis     Cellulitis and abscess of right lower extremity     Depression     Diabetes mellitus, type 2 (CMS HCC)     Esophageal reflux     H/O blood clots     LUNGS    Hypertension     Major depressive disorder     Personal history of fall     Shortness of breath     Sleep apnea     CPAP    Thyroid disease      Past Surgical History:   Procedure Laterality Date    HAND SURGERY Left     HX BUNIONECTOMY      HX TUBAL LIGATION       Family Medical History:       Problem Relation (Age of Onset)    Heart Attack Mother    No Known Problems Sister, Brother, Maternal Grandmother, Maternal Grandfather, Paternal Grandmother, Paternal Grandfather, Daughter, Son, Maternal Aunt, Maternal Uncle, Paternal Aunt, Paternal Uncle, Other    Stomach Cancer Father            Social History     Tobacco Use     Smoking status: Former     Current packs/day: 0.00     Average packs/day: 1 pack/day for 15.0 years (15.0 ttl pk-yrs)     Types: Cigarettes     Start date: 6     Quit date: 1993     Years since quitting: 31.9     Passive exposure: Past    Smokeless tobacco: Never   Vaping Use    Vaping status: Never Used   Substance Use Topics    Alcohol use: Not Currently    Drug use: Never       Review of Systems:  Review of Systems    Physical Exam:  Ht 1.626 m (5\' 4" )   Wt 92.1 kg (203 lb)   BMI 34.84 kg/m       Physical Exam  Constitutional:       Appearance: Normal appearance. She is well-developed, well-groomed and normal weight.   HENT:      Head: Normocephalic and atraumatic.      Right Ear: Hearing, ear canal and external ear normal. Tenderness present. Tympanic membrane is retracted.      Left Ear: Hearing, tympanic membrane, ear canal and external ear normal.      Nose: Septal deviation and mucosal edema present.      Right Turbinates: Enlarged.      Left Turbinates: Enlarged.      Mouth/Throat:      Lips: Pink.      Mouth: Mucous membranes are moist.      Pharynx: Oropharynx is clear. Uvula midline.   Eyes:      Extraocular Movements: Extraocular movements intact.   Neck:      Trachea: Phonation normal.   Pulmonary:      Effort: Pulmonary effort is normal.   Musculoskeletal:      Cervical back: Normal range of motion and neck supple.   Lymphadenopathy:      Cervical: No cervical adenopathy.   Skin:     General: Skin is warm.   Neurological:      Mental Status: She is alert and oriented to person, place, and time.  Cranial Nerves: Cranial nerves 2-12 are intact. No facial asymmetry.   Psychiatric:         Attention and Perception: Attention normal.         Mood and Affect: Mood normal.         Speech: Speech normal.         Behavior: Behavior normal. Behavior is cooperative.          Assessment:  ENCOUNTER DIAGNOSES     ICD-10-CM   1. Hearing loss, unspecified hearing loss type, unspecified laterality  H91.90    2. Dysfunction of right eustachian tube  H69.91   3. Non-seasonal allergic rhinitis, unspecified trigger  J30.89   4. Nasal turbinate hypertrophy  J34.3       Plan:  Medical records reviewed on 07/03/2023.  Audiogram and tympanometry reviewed and interpreted  In office right myringotomy with tube placement performed  Postprocedure instructions reviewed  Rx ofloxacin otic  Order audiogram  Continue Flonase daily  Continue Clarinex q.h.s.  Orders Placed This Encounter    [phone] - TYMPANOSTOMY (REQUIRING INSERTION OF VENTILATING TUBE), LOCAL/TOPICAL ANESTHESIA (AMB ONLY)    [facility]    POCT HEARING/VISION/TYMPANOGRAM (AMB ONLY)    ofloxacin (FLOXIN) 0.3 % Otic Drops Otic Solution     Follow-up after audiogram for review or sooner PRN    Lonia Farber, DO  ENT / Facial Plastic Surgery    I appreciate the opportunity to be involved in the care of your patients.  If you have any questions or concerns regarding this encounter, please do not hesitate to contact me at your convenience.        This note may have been partially generated using MModal Fluency Direct system, and there may be some incorrect words, spellings, and punctuation that were not noted in checking the note before saving, though effort was made to avoid such errors.

## 2023-07-21 ENCOUNTER — Other Ambulatory Visit (INDEPENDENT_AMBULATORY_CARE_PROVIDER_SITE_OTHER): Payer: Self-pay | Admitting: NURSE PRACTITIONER

## 2023-07-21 MED ORDER — IPRATROPIUM BROMIDE 21 MCG (0.03 %) NASAL SPRAY
2.0000 | Freq: Two times a day (BID) | NASAL | 3 refills | Status: DC | PRN
Start: 2023-07-21 — End: 2023-10-26

## 2023-07-23 ENCOUNTER — Telehealth (INDEPENDENT_AMBULATORY_CARE_PROVIDER_SITE_OTHER): Payer: Self-pay | Admitting: OTOLARYNGOLOGY

## 2023-07-23 NOTE — Telephone Encounter (Signed)
Left VM for pt regarding Audio at [facility] 09/07/23@1 :30PM, will mail out to patient.     LNT

## 2023-08-05 ENCOUNTER — Ambulatory Visit (INDEPENDENT_AMBULATORY_CARE_PROVIDER_SITE_OTHER): Payer: Self-pay | Admitting: OTOLARYNGOLOGY

## 2023-09-16 ENCOUNTER — Encounter (INDEPENDENT_AMBULATORY_CARE_PROVIDER_SITE_OTHER): Payer: Self-pay | Admitting: OTOLARYNGOLOGY

## 2023-09-16 ENCOUNTER — Other Ambulatory Visit: Payer: Self-pay

## 2023-09-16 ENCOUNTER — Ambulatory Visit: Payer: Self-pay | Attending: OTOLARYNGOLOGY | Admitting: OTOLARYNGOLOGY

## 2023-09-16 VITALS — Ht 64.0 in | Wt 203.0 lb

## 2023-09-16 DIAGNOSIS — J343 Hypertrophy of nasal turbinates: Secondary | ICD-10-CM | POA: Insufficient documentation

## 2023-09-16 DIAGNOSIS — H6121 Impacted cerumen, right ear: Secondary | ICD-10-CM | POA: Insufficient documentation

## 2023-09-16 DIAGNOSIS — H6991 Unspecified Eustachian tube disorder, right ear: Secondary | ICD-10-CM

## 2023-09-16 DIAGNOSIS — H903 Sensorineural hearing loss, bilateral: Secondary | ICD-10-CM | POA: Insufficient documentation

## 2023-09-16 DIAGNOSIS — J3089 Other allergic rhinitis: Secondary | ICD-10-CM | POA: Insufficient documentation

## 2023-09-16 DIAGNOSIS — H9221 Otorrhagia, right ear: Secondary | ICD-10-CM | POA: Insufficient documentation

## 2023-09-16 NOTE — H&P (Signed)
 ENT, [facility]  [address]  [place] [address]  Operated by [facility]      Name: Kathleen Munoz MRN:  [id number]   Date: 09/16/2023 DOB: [birth year] (78 y.o.)       Referring Provider:  Rosezena Contes, MD    Reason for Visit:   Chief Complaint   Patient presents with    Follow-up After Testing     Rc after audio. States having slight pain in right ear at times.        History of Present Illness:  Kathleen Munoz is a 78 y.o. female who is FU on ETD and RMT done 06/2023, here to review the audiogram results. Pt recalls getting water  into the right ear and noticed some yellow drainage.     Audiogram: AD Mild to Sev SNHL, Type B      AS Mild to Mod SNHL, Type A  Tympanogram: AD Type As       AS Type A    Patient History:  Patient Active Problem List   Diagnosis    Bilateral pulmonary embolism (CMS HCC)    Sleep apnea    Diabetes mellitus, type 2    Depressive disorder    RSV (acute bronchiolitis due to respiratory syncytial virus)    COPD exacerbation (CMS HCC)    DVT (deep venous thrombosis)    Current use of long term anticoagulation    Rhinitis    Anxiety     Current Outpatient Medications   Medication Sig    albuterol  sulfate (PROVENTIL  OR VENTOLIN  OR PROAIR ) 90 mcg/actuation Inhalation oral inhaler Take 1-2 Puffs by inhalation Every 6 hours as needed for Other (SOB)    aspirin  81 mg Oral Tablet, Chewable Chew 1 Tablet (81 mg total) Daily    buPROPion  (WELLBUTRIN  SR) 150 mg Oral tablet sustained-release 12 hr Take 1 Tablet (150 mg total) by mouth Twice daily with breakfast and lunch    busPIRone  (BUSPAR ) 10 mg Oral Tablet Take 1 Tablet (10 mg total) by mouth Three times a day    clonazePAM  (KLONOPIN ) 1 mg Oral Tablet Take 1 Tablet (1 mg total) by mouth Three times a day    desloratadine  (CLARINEX ) 5 mg Oral Tablet TAKE 1 TABLET BY MOUTH ONCE A DAY    donepeziL (ARICEPT) 10 mg Oral Tablet Take 1 Tablet (10 mg total) by mouth Daily    fluticasone propionate (FLONASE) 50  mcg/actuation Nasal Spray, Suspension SPRAY 2 SPRAY(S) (NASAL) 1 TIME PER DAY IN EACH NOSTRIL FOR ALLERGY SYMPTOMS    gabapentin  (NEURONTIN ) 400 mg Oral Capsule Take 1 Capsule (400 mg total) by mouth Three times a day    ipratropium bromide  (ATROVENT ) 21 mcg (0.03 %) Nasal nasal spray ADMINISTER 2 SPRAYS INTO AFFECTED NOSTRIL(S) TWICE PER DAY AS NEEDED    levothyroxine  (SYNTHROID) 88 mcg Oral Tablet Take 1 Tablet (88 mcg total) by mouth Every morning    lisinopriL -hydrochlorothiazide  (ZESTORETIC ) 20-12.5 mg Oral Tablet Take 1 Tablet by mouth Daily    multivitamin Oral Capsule Take 1 Capsule by mouth Daily    ofloxacin  (FLOXIN ) 0.3 % Otic Drops Otic Solution Instill 10 Drops into both ears Daily    ofloxacin  (FLOXIN ) 0.3 % Otic Drops Otic Solution Instill 10 Drops into both ears Daily for 14 days    omeprazole (PRILOSEC) 40 mg Oral Capsule, Delayed Release(E.C.) Take 1 Capsule (40 mg total) by mouth Twice daily    rivaroxaban  (XARELTO ) 20 mg Oral Tablet Take 1  Tablet (20 mg total) by mouth Every evening with dinner    SITagliptin phosphate (JANUVIA) 100 mg Oral Tablet Take 1 Tablet (100 mg total) by mouth Daily    traZODone  (DESYREL ) 100 mg Oral Tablet Take 2 Tablets (200 mg total) by mouth Every night as needed      Allergies   Allergen Reactions    Influenza Virus Vaccines      Past Medical History:   Diagnosis Date    Anxiety     Arthritis     Cellulitis and abscess of right lower extremity 11/17/2021    Depression     Diabetes mellitus, type 2     Esophageal reflux     H/O blood clots     LUNGS    Hypertension     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    Personal history of fall     Shortness of breath     Sleep apnea     CPAP    Thyroid  disease      Past Surgical History:   Procedure Laterality Date    HAND SURGERY Left     HX BUNIONECTOMY      HX TUBAL LIGATION       Family Medical History:       Problem Relation (Age of Onset)    Heart Attack Mother    No Known Problems Sister, Brother, Maternal Grandmother,  Maternal Grandfather, Paternal Grandmother, Paternal Grandfather, Daughter, Son, Maternal Aunt, Maternal Uncle, Paternal Aunt, Paternal Uncle, Other    Stomach Cancer Father            Social History     Tobacco Use    Smoking status: Former     Current packs/day: 0.00     Average packs/day: 1 pack/day for 15.0 years (15.0 ttl pk-yrs)     Types: Cigarettes     Start date: 46     Quit date: 1993     Years since quitting: 32.3     Passive exposure: Past    Smokeless tobacco: Never   Vaping Use    Vaping status: Never Used   Substance Use Topics    Alcohol use: Not Currently    Drug use: Never       Review of Systems:  Review of Systems    Physical Exam:  Ht 1.626 m (5\' 4" )   Wt 92.1 kg (203 lb)   BMI 34.84 kg/m       Physical Exam  Constitutional:       Appearance: Normal appearance. She is well-developed, well-groomed and normal weight.   HENT:      Head: Normocephalic and atraumatic.      Right Ear: Hearing, ear canal and external ear normal. Tenderness present. There is impacted cerumen.      Left Ear: Hearing, tympanic membrane, ear canal and external ear normal.      Ears:      Comments: Dry heme partially occluding R EAC-- partially debrided, no tube visualized     Nose: Septal deviation and mucosal edema present.      Right Turbinates: Enlarged.      Left Turbinates: Enlarged.      Mouth/Throat:      Lips: Pink.      Mouth: Mucous membranes are moist.      Pharynx: Oropharynx is clear. Uvula midline.   Eyes:      Extraocular Movements: Extraocular movements intact.   Neck:      Trachea: Phonation normal.  Pulmonary:      Effort: Pulmonary effort is normal.   Musculoskeletal:      Cervical back: Normal range of motion and neck supple.   Lymphadenopathy:      Cervical: No cervical adenopathy.   Skin:     General: Skin is warm.   Neurological:      Mental Status: She is alert and oriented to person, place, and time.      Cranial Nerves: Cranial nerves 2-12 are intact. No facial asymmetry.   Psychiatric:          Attention and Perception: Attention normal.         Mood and Affect: Mood normal.         Speech: Speech normal.         Behavior: Behavior normal. Behavior is cooperative.          Assessment:  ENCOUNTER DIAGNOSES     ICD-10-CM   1. Dysfunction of right eustachian tube  H69.91   2. Non-seasonal allergic rhinitis, unspecified trigger  J30.89   3. Nasal turbinate hypertrophy  J34.3   4. Otorrhagia of right ear  H92.21   5. 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 Impacted cerumen of right ear  H61.21       Plan:  Medical records reviewed on 09/16/2023.  Right cerumen/dried blood debrided  Reviewed audiogram results. No tube visualized in R EAC.   Rx ofloxacin  for right ear.    Dry ear precautions  Continue Flonase daily  Continue Clarinex  q.h.s.  Orders Placed This Encounter    [phone] - REMOVAL IMPACTED CERUMEN W/ INSTRUMENT, UNILATERAL (AMB ONLY-PD)    POCT HEARING/VISION/TYMPANOGRAM (AMB ONLY)    ofloxacin  (FLOXIN ) 0.3 % Otic Drops Otic Solution     Follow-up in 3-4 weeks or sooner PRN    Evans Him, PA-C    The advanced practice clinician's documentation was reviewed and amended in its entirety.  The patient was seen by me and examined as part of a shared service with the advanced practice clinician. The patient was clinically evaluated, and all pertinent records, lab results and imaging were reviewed.  I agree with the advanced practice clinicians documentation, which reflects my medical decision making.  I personally spent more than 50% of the encounter with direct patient care including: obtaining history, performing physical exam, counseling the patient and family, and directing medical decision making.  Pennye Boy, D.O., MMS  ENT / Facial Plastic Surgery      I appreciate the opportunity to be involved in the care of your patients.  If you have any questions or concerns regarding this encounter, please do not hesitate to contact me at your convenience.        This note may have  been partially generated using MModal Fluency Direct system, and there may be some incorrect words, spellings, and punctuation that were not noted in checking the note before saving, though effort was made to avoid such errors.

## 2023-09-18 MED ORDER — OFLOXACIN 0.3 % EAR DROPS
10.0000 [drp] | Freq: Every day | OTIC | 1 refills | Status: DC
Start: 2023-09-18 — End: 2023-10-13

## 2023-10-12 ENCOUNTER — Other Ambulatory Visit (INDEPENDENT_AMBULATORY_CARE_PROVIDER_SITE_OTHER): Payer: Self-pay | Admitting: NURSE PRACTITIONER

## 2023-10-13 ENCOUNTER — Ambulatory Visit: Payer: Self-pay | Attending: OTOLARYNGOLOGY | Admitting: OTOLARYNGOLOGY

## 2023-10-13 ENCOUNTER — Other Ambulatory Visit: Payer: Self-pay

## 2023-10-13 ENCOUNTER — Encounter (INDEPENDENT_AMBULATORY_CARE_PROVIDER_SITE_OTHER): Payer: Self-pay | Admitting: OTOLARYNGOLOGY

## 2023-10-13 VITALS — Ht 64.0 in

## 2023-10-13 DIAGNOSIS — H9221 Otorrhagia, right ear: Secondary | ICD-10-CM | POA: Insufficient documentation

## 2023-10-13 DIAGNOSIS — J3089 Other allergic rhinitis: Secondary | ICD-10-CM | POA: Insufficient documentation

## 2023-10-13 DIAGNOSIS — J343 Hypertrophy of nasal turbinates: Secondary | ICD-10-CM | POA: Insufficient documentation

## 2023-10-13 DIAGNOSIS — H903 Sensorineural hearing loss, bilateral: Secondary | ICD-10-CM | POA: Insufficient documentation

## 2023-10-13 DIAGNOSIS — H6991 Unspecified Eustachian tube disorder, right ear: Secondary | ICD-10-CM | POA: Insufficient documentation

## 2023-10-13 NOTE — H&P (Signed)
 ENT, [facility]  [address]  [place] [address]  Operated by [facility]      Name: Kathleen Munoz MRN:  [id number]   Date: 10/13/2023 DOB: [birth year] (78 y.o.)       Referring Provider:  Rosezena Contes, MD    Reason for Visit:   Chief Complaint   Patient presents with    Follow Up     4 week recheck         History of Present Illness:  Kathleen Munoz is a 78 y.o. female who is FU on right otorrhagia, history of RMT on 06/2023. Used the ear gtts and also saw blood on a qtip once. Her ear feels better in general.  Patient does report thick postnasal drip and secretions mostly in the morning in wondering if saline rinses would help.      Patient History:  Patient Active Problem List   Diagnosis    Bilateral pulmonary embolism (CMS HCC)    Sleep apnea    Diabetes mellitus, type 2    Depressive disorder    RSV (acute bronchiolitis due to respiratory syncytial virus)    COPD exacerbation (CMS HCC)    DVT (deep venous thrombosis)    Current use of long term anticoagulation    Rhinitis    Anxiety     Current Outpatient Medications   Medication Sig    albuterol  sulfate (PROVENTIL  OR VENTOLIN  OR PROAIR ) 90 mcg/actuation Inhalation oral inhaler Take 1-2 Puffs by inhalation Every 6 hours as needed for Other (SOB)    aspirin  81 mg Oral Tablet, Chewable Chew 1 Tablet (81 mg total) Daily    buPROPion  (WELLBUTRIN  SR) 150 mg Oral tablet sustained-release 12 hr Take 1 Tablet (150 mg total) by mouth Twice daily with breakfast and lunch    busPIRone  (BUSPAR ) 10 mg Oral Tablet Take 1 Tablet (10 mg total) by mouth Three times a day    clonazePAM  (KLONOPIN ) 1 mg Oral Tablet Take 1 Tablet (1 mg total) by mouth Three times a day    desloratadine  (CLARINEX ) 5 mg Oral Tablet TAKE 1 TABLET BY MOUTH ONCE A DAY    donepeziL (ARICEPT) 10 mg Oral Tablet Take 1 Tablet (10 mg total) by mouth Daily    fluticasone propionate (FLONASE) 50 mcg/actuation Nasal Spray, Suspension SPRAY 2 SPRAY(S) (NASAL) 1 TIME  PER DAY IN EACH NOSTRIL FOR ALLERGY SYMPTOMS    gabapentin  (NEURONTIN ) 400 mg Oral Capsule Take 1 Capsule (400 mg total) by mouth Three times a day    ipratropium bromide  (ATROVENT ) 21 mcg (0.03 %) Nasal nasal spray Administer 2 Sprays into affected nostril(s) Twice per day as needed    levothyroxine  (SYNTHROID) 88 mcg Oral Tablet Take 1 Tablet (88 mcg total) by mouth Every morning    lisinopriL -hydrochlorothiazide  (ZESTORETIC ) 20-12.5 mg Oral Tablet Take 1 Tablet by mouth Daily    multivitamin Oral Capsule Take 1 Capsule by mouth Daily    ofloxacin  (FLOXIN ) 0.3 % Otic Drops Otic Solution Instill 10 Drops into both ears Daily    omeprazole (PRILOSEC) 40 mg Oral Capsule, Delayed Release(E.C.) Take 1 Capsule (40 mg total) by mouth Twice daily    rivaroxaban  (XARELTO ) 20 mg Oral Tablet Take 1 Tablet (20 mg total) by mouth Every evening with dinner    SITagliptin phosphate (JANUVIA) 100 mg Oral Tablet Take 1 Tablet (100 mg total) by mouth Daily    traZODone  (DESYREL ) 100 mg Oral Tablet Take 2 Tablets (200 mg total) by  mouth Every night as needed      Allergies   Allergen Reactions    Influenza Virus Vaccines      Past Medical History:   Diagnosis Date    Anxiety     Arthritis     Cellulitis and abscess of right lower extremity 11/17/2021    Depression     Diabetes mellitus, type 2     Esophageal reflux     H/O blood clots     LUNGS    Hypertension     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    Personal history of fall     Shortness of breath     Sleep apnea     CPAP    Thyroid  disease      Past Surgical History:   Procedure Laterality Date    HAND SURGERY Left     HX BUNIONECTOMY      HX TUBAL LIGATION       Family Medical History:       Problem Relation (Age of Onset)    Heart Attack Mother    No Known Problems Sister, Brother, Maternal Grandmother, Maternal Grandfather, Paternal Grandmother, Paternal Grandfather, Daughter, Son, Maternal Aunt, Maternal Uncle, Paternal Aunt, Paternal Uncle, Other    Stomach Cancer Father             Social History     Tobacco Use    Smoking status: Former     Current packs/day: 0.00     Average packs/day: 1 pack/day for 15.0 years (15.0 ttl pk-yrs)     Types: Cigarettes     Start date: 40     Quit date: 1993     Years since quitting: 32.2     Passive exposure: Past    Smokeless tobacco: Never   Vaping Use    Vaping status: Never Used   Substance Use Topics    Alcohol use: Not Currently    Drug use: Never       Review of Systems:  Review of Systems    Physical Exam:  Ht 1.626 m (5\' 4" )   BMI 34.84 kg/m       Physical Exam  Constitutional:       Appearance: Normal appearance. She is well-developed, well-groomed and normal weight.   HENT:      Head: Normocephalic and atraumatic.      Right Ear: Hearing, ear canal and external ear normal.      Left Ear: Hearing, tympanic membrane, ear canal and external ear normal.      Ears:      Comments: Dry heme mostly resolved, unobstructing  Tube seen in EAC     Nose: Septal deviation and mucosal edema present.      Right Turbinates: Enlarged.      Left Turbinates: Enlarged.      Mouth/Throat:      Lips: Pink.      Mouth: Mucous membranes are moist.      Pharynx: Oropharynx is clear. Uvula midline.   Eyes:      Extraocular Movements: Extraocular movements intact.   Neck:      Trachea: Phonation normal.   Pulmonary:      Effort: Pulmonary effort is normal.   Musculoskeletal:      Cervical back: Normal range of motion and neck supple.   Lymphadenopathy:      Cervical: No cervical adenopathy.   Skin:     General: Skin is warm.   Neurological:  Mental Status: She is alert and oriented to person, place, and time.      Cranial Nerves: Cranial nerves 2-12 are intact. No facial asymmetry.   Psychiatric:         Attention and Perception: Attention normal.         Mood and Affect: Mood normal.         Speech: Speech normal.         Behavior: Behavior normal. Behavior is cooperative.          Assessment:  ENCOUNTER DIAGNOSES     ICD-10-CM   1. Dysfunction of right  eustachian tube  H69.91   2. Non-seasonal allergic rhinitis, unspecified trigger  J30.89   3. Nasal turbinate hypertrophy  J34.3   4. Otorrhagia of right ear  H92.21   5. 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  AD tube extruded in EAC. Avoid Qtip use.   NeilMed sinus rinse daily  Order audiogram  Continue Flonase daily  Continue Clarinex  q.h.s.  Orders Placed This Encounter    [facility]     Follow-up after repeat audiogram or sooner PRN      The advanced practice clinician's documentation was reviewed/amended in its entirety with the assessment and plan portion completely performed independently by me during this encounter.   Pennye Boy, D.O., MMS  ENT / Facial Plastic Surgery      Evans Him, PA-C    I appreciate the opportunity to be involved in the care of your patients.  If you have any questions or concerns regarding this encounter, please do not hesitate to contact me at your convenience.        This note may have been partially generated using MModal Fluency Direct system, and there may be some incorrect words, spellings, and punctuation that were not noted in checking the note before saving, though effort was made to avoid such errors.

## 2023-10-14 ENCOUNTER — Encounter (INDEPENDENT_AMBULATORY_CARE_PROVIDER_SITE_OTHER): Payer: Self-pay | Admitting: OTOLARYNGOLOGY

## 2023-10-25 ENCOUNTER — Other Ambulatory Visit (INDEPENDENT_AMBULATORY_CARE_PROVIDER_SITE_OTHER): Payer: Self-pay | Admitting: NURSE PRACTITIONER

## 2023-11-02 MED ORDER — OFLOXACIN 0.3 % EAR DROPS
10.0000 [drp] | Freq: Every day | OTIC | 1 refills | Status: AC
Start: 2023-11-02 — End: 2023-11-16

## 2023-11-02 NOTE — Procedures (Signed)
 [facility], [facility]  [address]  [place] [address]  Operated by [facility]  Procedure Note    Name: Kathleen Munoz MRN:  [id number]   Date: 09/16/2023 DOB:  [birth year] (78 y.o.)         69210 - REMOVAL IMPACTED CERUMEN W/ INSTRUMENT, UNILATERAL (AMB ONLY-PD)    Performed by: Pennye Boy, DO  Authorized by: Pennye Boy, DO    Time Out:     Immediately before the procedure, a time out was called:  Yes    Patient verified:  Yes    Procedure Verified:  Yes    Site Verified:  Yes  Procedure: Cerumen cleaning  Pre-op Dx: Cerumen impaction      Right EAC(s) examined under binocular microscopy.  Cerumen and/or debris was cleaned from the canal(s) using curettes, suction, and alligator forceps.  Patient tolerated procedure well.  ENT was present for the entire procedure.     Pennye Boy, DO

## 2023-11-10 ENCOUNTER — Emergency Department
Admission: EM | Admit: 2023-11-10 | Discharge: 2023-11-10 | Disposition: A | Discharge status: Home / Self Care | Attending: Physician Assistant | Admitting: Physician Assistant

## 2023-11-10 ENCOUNTER — Other Ambulatory Visit: Payer: Self-pay

## 2023-11-10 ENCOUNTER — Emergency Department (HOSPITAL_COMMUNITY)

## 2023-11-10 ENCOUNTER — Encounter (HOSPITAL_COMMUNITY): Payer: Self-pay

## 2023-11-10 DIAGNOSIS — S46911A Strain of unspecified muscle, fascia and tendon at shoulder and upper arm level, right arm, initial encounter: Secondary | ICD-10-CM | POA: Insufficient documentation

## 2023-11-10 DIAGNOSIS — Z7901 Long term (current) use of anticoagulants: Secondary | ICD-10-CM | POA: Insufficient documentation

## 2023-11-10 DIAGNOSIS — Z86718 Personal history of other venous thrombosis and embolism: Secondary | ICD-10-CM | POA: Insufficient documentation

## 2023-11-10 DIAGNOSIS — M5030 Other cervical disc degeneration, unspecified cervical region: Secondary | ICD-10-CM | POA: Insufficient documentation

## 2023-11-10 DIAGNOSIS — G8929 Other chronic pain: Secondary | ICD-10-CM | POA: Insufficient documentation

## 2023-11-10 DIAGNOSIS — M545 Low back pain, unspecified: Secondary | ICD-10-CM | POA: Insufficient documentation

## 2023-11-10 DIAGNOSIS — W19XXXA Unspecified fall, initial encounter: Secondary | ICD-10-CM

## 2023-11-10 DIAGNOSIS — S46919A Strain of unspecified muscle, fascia and tendon at shoulder and upper arm level, unspecified arm, initial encounter: Secondary | ICD-10-CM

## 2023-11-10 DIAGNOSIS — M549 Dorsalgia, unspecified: Secondary | ICD-10-CM

## 2023-11-10 DIAGNOSIS — Y92007 Garden or yard of unspecified non-institutional (private) residence as the place of occurrence of the external cause: Secondary | ICD-10-CM | POA: Insufficient documentation

## 2023-11-10 DIAGNOSIS — S0990XA Unspecified injury of head, initial encounter: Secondary | ICD-10-CM | POA: Insufficient documentation

## 2023-11-10 DIAGNOSIS — S46912A Strain of unspecified muscle, fascia and tendon at shoulder and upper arm level, left arm, initial encounter: Secondary | ICD-10-CM | POA: Insufficient documentation

## 2023-11-10 DIAGNOSIS — W1781XA Fall down embankment (hill), initial encounter: Secondary | ICD-10-CM | POA: Insufficient documentation

## 2023-11-10 DIAGNOSIS — S8000XA Contusion of unspecified knee, initial encounter: Secondary | ICD-10-CM

## 2023-11-10 DIAGNOSIS — S8001XA Contusion of right knee, initial encounter: Secondary | ICD-10-CM | POA: Insufficient documentation

## 2023-11-10 DIAGNOSIS — S8002XA Contusion of left knee, initial encounter: Secondary | ICD-10-CM | POA: Insufficient documentation

## 2023-11-10 MED ORDER — KETOROLAC 30 MG/ML (1 ML) INJECTION SOLUTION
INTRAMUSCULAR | Status: AC
Start: 2023-11-10 — End: 2023-11-10
  Filled 2023-11-10: qty 1

## 2023-11-10 MED ORDER — KETOROLAC 30 MG/ML (1 ML) INJECTION SOLUTION
15.0000 mg | INTRAMUSCULAR | Status: AC
Start: 2023-11-10 — End: 2023-11-10
  Administered 2023-11-10: 15 mg via INTRAVENOUS

## 2023-11-10 MED ORDER — KETOROLAC 30 MG/ML (1 ML) INJECTION SOLUTION
30.0000 mg | INTRAMUSCULAR | Status: DC
Start: 2023-11-10 — End: 2023-11-10

## 2023-11-10 NOTE — ED Triage Notes (Addendum)
 To ED via [facility] with c/o fall over an 86ft embankment. Pt reports she was working in the yard when she fell forward. Denies LOC. Pt c/o bilateral shoulder pain, worse on the right, knee pain and back pain. Pt states she has chronic back pain but falling this evening has made it worse.     EMS: 18g IV left AC, 22g IV left wrist, c-collar, backboard    50mcg Fentanyl , 4mg  Zofran, NS infusing

## 2023-11-10 NOTE — ED Nurses Note (Signed)
 Purewick placed at this time.

## 2023-11-10 NOTE — Discharge Instructions (Signed)
 All imaging looks good.  No fractures or dislocations no organ injury.  You need follow up with the primary care to make sure your knee and shoulder getting better if not you need to follow up with the orthopedist or get further imaging.  You can take Tylenol  or Motrin  for any discomfort.  If new or worsening symptoms return to the emergency department

## 2023-11-10 NOTE — ED Nurses Note (Signed)

## 2023-11-10 NOTE — ED Provider Notes (Signed)
 [facility] - Emergency Department  ED Primary Provider Note  History of Present Illness   Chief Complaint   Patient presents with    Fall    Shoulder Pain     Bilateral, worse on right    Knee Pain     bilateral    Back Pain     Kathleen Munoz is a 78 y.o. female who had concerns including Fall, Shoulder Pain, Knee Pain, and Back Pain.  Arrival: The patient arrived by Ambulance    Patient is a 78 year old female past medical history of deep vein thrombosis currently on Xarelto  presents to the emergency department after a fall.  Patient was trying to pull a read in her yd and rolled down a hill about 8 ft.  She denies loss of consciousness.  She has chronic back pain and states that her back pain is a little bit worse she localizes it to the lumbar area.  She also has complaints of right shoulder pain and right knee pain.  She denies chest pain shortness of breath abdominal pain nausea or vomiting.  Patient presents on a backboard with C-collar      History Reviewed This Encounter: Medical History  Surgical History  Family History  Social History    Physical Exam   ED Triage Vitals [11/10/23 2020]   BP (Non-Invasive) (!) 170/69   Heart Rate 58   Respiratory Rate 14   Temperature 36.2 C (97.1 F)   SpO2 92 %   Weight 90.7 kg (200 lb)   Height 1.626 m (5\' 4" )     Physical Exam  Constitutional:       Appearance: Normal appearance.   HENT:      Head: Normocephalic and atraumatic.      Nose: Nose normal.      Mouth/Throat:      Mouth: Mucous membranes are moist.   Eyes:      Extraocular Movements: Extraocular movements intact.      Pupils: Pupils are equal, round, and reactive to light.   Neck:      Comments: Presents n cervical collar  Cardiovascular:      Rate and Rhythm: Normal rate and regular rhythm.      Pulses: Normal pulses.      Heart sounds: Normal heart sounds.   Pulmonary:      Effort: Pulmonary effort is normal.      Breath sounds: Normal breath sounds.   Abdominal:      General:  Abdomen is flat.      Palpations: Abdomen is soft.   Musculoskeletal:      Comments: No obvious deformity of the right upper extremity she is neurovascularly intact distally no tenderness of the elbow or wrist.  Patient has a swath on.  No tenderness to palpation over the chest wall or abdomen no crepitus no deformity tenderness over the pelvis or hip tenderness of the bilateral ankles lower extremity mild tenderness of the right knee   Neurological:      Mental Status: She is alert.       Patient Data   Labs Ordered/Reviewed - No data to display  CT CHEST ABDOMEN PELVIS WO IV CONTRAST   Final Result by Edi, Radresults In (04/29 2148)   NO ACUTE FINDINGS               Radiologist location ID: [id number]         CT CERVICAL SPINE WO IV CONTRAST   Final Result  by Edi, Radresults In (04/29 2143)   NO ACUTE CERVICAL FRACTURE.  DEGENERATIVE CHANGES.         Radiologist location ID: [id number]         CT BRAIN WO IV CONTRAST   Final Result by Edi, Radresults In (04/29 2141)   NO ACUTE FINDINGS         Radiologist location ID: [id number]         XR HIP RIGHT W PELVIS 2-3 VIEWS   Final Result by Edi, Radresults In (04/29 2136)   No acute findings                Radiologist location ID: [id number]         XR KNEE RIGHT 4 OR MORE VIEW   Final Result by Edi, Radresults In (04/29 2135)   NO VISIBLE FRACTURE.  IF THERE IS ONGOING CLINICAL SUSPICION FOR FRACTURE, CONSIDER FOLLOWUP IMAGING IN 7-10 DAYS.                Radiologist location ID: [id number]         XR SHOULDER RIGHT   Final Result by Edi, Radresults In (04/29 2135)   NO VISIBLE FRACTURE.  IF THERE IS ONGOING CLINICAL SUSPICION FOR FRACTURE, CONSIDER FOLLOWUP IMAGING IN 7-10 DAYS.                Radiologist location ID: [id number]           Medical Decision Making        Medical Decision Making  CT of head cervical spine chest abdomen pelvis unremarkable no acute pathology.  Plain films of the right shoulder and right knee did not demonstrate any fracture  dislocation.  Patient is given Toradol  for discomfort.  She will be discharged home to follow up with the primary care.    Amount and/or Complexity of Data Reviewed  Radiology: ordered.    Risk  Prescription drug management.                Medications Ordered/Administered in the ED   ketorolac  (TORADOL ) 30 mg/mL injection (15 mg Intravenous Given 11/10/23 2203)     Clinical Impression   Fall (Primary)   Shoulder strain   Knee contusion       Disposition: Discharged

## 2024-02-04 ENCOUNTER — Other Ambulatory Visit (INDEPENDENT_AMBULATORY_CARE_PROVIDER_SITE_OTHER): Payer: Self-pay | Admitting: NURSE PRACTITIONER

## 2024-02-11 ENCOUNTER — Ambulatory Visit (INDEPENDENT_AMBULATORY_CARE_PROVIDER_SITE_OTHER): Payer: Self-pay

## 2024-02-11 ENCOUNTER — Other Ambulatory Visit: Payer: Self-pay

## 2024-02-11 ENCOUNTER — Ambulatory Visit (INDEPENDENT_AMBULATORY_CARE_PROVIDER_SITE_OTHER)

## 2024-02-11 ENCOUNTER — Encounter (INDEPENDENT_AMBULATORY_CARE_PROVIDER_SITE_OTHER): Payer: Self-pay

## 2024-02-11 VITALS — BP 110/53 | HR 62 | Ht 64.0 in | Wt 195.0 lb

## 2024-02-11 DIAGNOSIS — E119 Type 2 diabetes mellitus without complications: Secondary | ICD-10-CM

## 2024-02-11 DIAGNOSIS — E559 Vitamin D deficiency, unspecified: Secondary | ICD-10-CM

## 2024-02-11 DIAGNOSIS — E1122 Type 2 diabetes mellitus with diabetic chronic kidney disease: Secondary | ICD-10-CM

## 2024-02-11 DIAGNOSIS — I129 Hypertensive chronic kidney disease with stage 1 through stage 4 chronic kidney disease, or unspecified chronic kidney disease: Secondary | ICD-10-CM

## 2024-02-11 DIAGNOSIS — N1832 Chronic kidney disease, stage 3b: Secondary | ICD-10-CM

## 2024-02-11 DIAGNOSIS — I10 Essential (primary) hypertension: Secondary | ICD-10-CM

## 2024-02-11 DIAGNOSIS — Z7984 Long term (current) use of oral hypoglycemic drugs: Secondary | ICD-10-CM

## 2024-02-11 NOTE — Nursing Note (Signed)
 New patient for CKD 3B referred by Dr. Elida.

## 2024-02-11 NOTE — Progress Notes (Signed)
 NEPHROLOGY, [facility]  [address]  [place] [address]    Progress Note    Name: Kathleen Munoz MRN:  [id number]   Date: 02/11/2024 DOB:  07-30-1945 (78 y.o.)              Nephrology Out  Patient Visit         HPI : 78 y.o.  female here to establish care for chronic kidney disease.  Creatinine 1.27 GFR 43 chronic kidney disease stage IIIB.  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  Currently taking xerelto 20 mg daily. Denies current NSAID use.  Denies hematuria and dysuria.  States blood pressure is in good control.  Reports no issues with edema.  There is no edema noted today,   Social history:  No history of tobacco use, alcohol use, or illicit drug use. Family history :  No family history of kidney disease    Lab work from other lab:  May 28th creatinine 1.27 GFR 43 hemoglobin A1c 6.0 hemoglobin 13.6 TSH 2.1 potassium 4.9      ROS:     Assessment was negative except what mentioned in in the HPI.     OBJECTIVE:   BP (!) 110/53   Pulse 62   Ht 1.626 m (5' 4)   Wt 88.5 kg (195 lb)   BMI 33.47 kg/m       General:  NAD, AAOx3  HEENT:  EOMI,  no icterus  NECK: No increased JVD.  Normal inspection   HEART:  Regular rhythm. No murmurs or rubs. No chest wall tenderness   LUNGS: Clear to auscultation bilateral. No wheezes, rales, or rhonchi.   ABDOMEN: +BS, Soft, nontender and nondistended. No rebound or guarding present.   EXTREMITIES: No edema. No cyanosis or clubbing.No asterixis    NEURO : Normal speech, EOMI.       LABORATORY DATA:   Lab Results   Component Value Date    BUN 17 01/30/2023    BUN 21 07/04/2022    BUN 21 07/03/2022    CREATININE 1.12 01/30/2023    CREATININE 1.09 07/04/2022    CREATININE 1.14 07/03/2022    BUNCRRATIO 15 01/30/2023    BUNCRRATIO 19 07/04/2022    BUNCRRATIO 18 07/03/2022    GFR 51 (L) 01/30/2023    GFR 53 (L) 07/04/2022    GFR 50 (L) 07/03/2022    SODIUM 140 01/30/2023     SODIUM 140 07/04/2022    SODIUM 141 07/03/2022    POTASSIUM 4.2 01/30/2023    POTASSIUM 4.7 07/04/2022    POTASSIUM 4.8 07/03/2022    CHLORIDE 102 01/30/2023    CHLORIDE 105 07/04/2022    CHLORIDE 106 07/03/2022    CO2 31 01/30/2023    CO2 29 07/04/2022    CO2 26 07/03/2022    ANIONGAP 7 01/30/2023    ANIONGAP 6 07/04/2022    ANIONGAP 9 07/03/2022    CALCIUM 9.8 01/30/2023    CALCIUM 9.5 07/04/2022    CALCIUM 9.4 07/03/2022    ALBUMIN 4.4 01/30/2023    ALBUMIN 4.2 07/02/2022    ALBUMIN 3.8 04/07/2022    HGB 12.6 01/30/2023    HGB 11.8 07/04/2022    HGB 11.9 07/03/2022    HCT 37.8 01/30/2023    HCT 35.8 07/04/2022    HCT 35.5 07/03/2022           MEDICATIONS:  Outpatient Medications Marked as Taking for the 02/11/24 encounter (Office Visit)  with Cathryne Iha, NP   Medication Sig    albuterol  sulfate (PROVENTIL  OR VENTOLIN  OR PROAIR ) 90 mcg/actuation Inhalation oral inhaler Take 1-2 Puffs by inhalation Every 6 hours as needed for Other (SOB)    aspirin  81 mg Oral Tablet, Chewable Chew 1 Tablet (81 mg total) Daily    buPROPion  (WELLBUTRIN  SR) 150 mg Oral tablet sustained-release 12 hr Take 1 Tablet (150 mg total) by mouth Twice daily with breakfast and lunch    busPIRone  (BUSPAR ) 10 mg Oral Tablet Take 1 Tablet (10 mg total) by mouth Three times a day    clonazePAM  (KLONOPIN ) 1 mg Oral Tablet Take 1 Tablet (1 mg total) by mouth Three times a day    desloratadine  (CLARINEX ) 5 mg Oral Tablet TAKE 1 TABLET BY MOUTH ONCE A DAY    fluticasone propionate (FLONASE) 50 mcg/actuation Nasal Spray, Suspension SPRAY 2 SPRAY(S) (NASAL) 1 TIME PER DAY IN EACH NOSTRIL FOR ALLERGY SYMPTOMS    gabapentin  (NEURONTIN ) 400 mg Oral Capsule Take 1 Capsule (400 mg total) by mouth Three times a day    ipratropium bromide  (ATROVENT ) 21 mcg (0.03 %) Nasal nasal spray SPRAY 2 SPRAYS INTO AFFECTED NOSTRILS TWICE A DAY AS NEEDED    levothyroxine  (SYNTHROID) 88 mcg Oral Tablet Take 1 Tablet (88 mcg total) by mouth Every morning     lisinopriL -hydrochlorothiazide  (ZESTORETIC ) 20-12.5 mg Oral Tablet Take 1 Tablet by mouth Daily    multivitamin Oral Capsule Take 1 Capsule by mouth Daily    omeprazole (PRILOSEC) 40 mg Oral Capsule, Delayed Release(E.C.) Take 1 Capsule (40 mg total) by mouth Twice daily    rivaroxaban  (XARELTO ) 20 mg Oral Tablet Take 1 Tablet (20 mg total) by mouth Every evening with dinner    SITagliptin phosphate (JANUVIA) 100 mg Oral Tablet Take 1 Tablet (100 mg total) by mouth Daily    traZODone  (DESYREL ) 100 mg Oral Tablet Take 2 Tablets (200 mg total) by mouth Every night as needed     Current Outpatient Medications   Medication Instructions    albuterol  sulfate (PROVENTIL  OR VENTOLIN  OR PROAIR ) 90 mcg/actuation Inhalation oral inhaler 1-2 Puffs, EVERY 6 HOURS PRN    aspirin  81 mg, Daily    buPROPion  (WELLBUTRIN  SR) 150 mg, Oral, 2 TIMES DAILY WITH BREAKFAST & LUNCH    busPIRone  (BUSPAR ) 10 mg, Oral, 3 TIMES DAILY    clonazePAM  (KLONOPIN ) 1 mg, 3 TIMES DAILY    desloratadine  (CLARINEX ) 5 mg, Oral, Daily    fluticasone propionate (FLONASE) 50 mcg/actuation Nasal Spray, Suspension SPRAY 2 SPRAY(S) (NASAL) 1 TIME PER DAY IN EACH NOSTRIL FOR ALLERGY SYMPTOMS    gabapentin  (NEURONTIN ) 400 mg, 3 TIMES DAILY    ipratropium bromide  (ATROVENT ) 21 mcg (0.03 %) Nasal nasal spray SPRAY 2 SPRAYS INTO AFFECTED NOSTRILS TWICE A DAY AS NEEDED    levothyroxine  (SYNTHROID) 88 mcg, EVERY MORNING    lisinopriL -hydrochlorothiazide  (ZESTORETIC ) 20-12.5 mg Oral Tablet 1 Tablet, Daily    multivitamin Oral Capsule 1 Capsule, Daily    omeprazole (PRILOSEC) 40 mg, 2 TIMES DAILY    rivaroxaban  (XARELTO ) 20 mg, Oral, EVERY EVENING WITH DINNER    SITagliptin phosphate (JANUVIA) 100 mg, Daily    traZODone  (DESYREL ) 200 mg, NIGHTLY PRN      ASSESSMENT / PLAN:  ENCOUNTER DIAGNOSES     ICD-10-CM   1. Stage 3b chronic kidney disease (CMS HCC)  N18.32   2. Hypertension, unspecified type  I10   3. DM (diabetes mellitus)  E11.9   4. Vitamin D  deficiency  E55.9            Chronic kidney disease  -Stage IIIb  -Due to diabetes and hypertension  -Creatinine 1.27 GFR 43   -Baseline creatinine 1.2  - obtain Total protein to creatinine ratio   -CBC and a basic metabolic panel  -Return to clinic in 3 months  -Continue low-sodium diet  -balanced Fluid intake 40-50 oz a day.    -Avoid NSAIDs.  Tylenol  only for pain  -Renal imaging:  Kidney is unremarkable  -lisinopril -hydrochlorothiazide  20 mg-12.5 mg daily  -Sodium Glucose Cotransporter-2 (SGLT2) Inhibitors: may consider jardiance or farxiga.         Hypertension  -Blood pressure is acceptable  -Goal less than 130/80  -Continue lisinopril -hydrochlorothiazide  20-12.5 mg daily  -Low-salt diet      Diabetes type 2  -A1c goal is less than 7   Lab Results   Component Value Date    HA1C 6.2 (H) 04/08/2022      -Continue Januvia  100 mg daily   -Diabetic diet  -Increase activity and exercise as possible  -Monitor A1C        Vitamin-D deficiency  -obtain vitamin-D level       I have discussed with the patient the following issues:  The main goal of treatment is to prevent progression of CKD to complete kidney failure. The best way to do this is to control the underlying cause.  the optimal diet is one similar to the Dietary Approaches to Stop Hypertension (DASH) diet, consisting of fruits, vegetables, legumes, fish, poultry, and whole grains.  Restrict sodium intake to less than 2 g/day            Orders Placed This Encounter    CBC/DIFF    MAGNESIUM     VITAMIN D 25 TOTAL    URIC ACID    PROTEIN/CREATININE RATIO, URINE, RANDOM    PARATHYROID HORMONE (PTH)    MICROALBUMIN/CREATININE RATIO, URINE, RANDOM    COMPREHENSIVE METABOLIC PANEL, NON-FASTING       Suzen Risser, NP

## 2024-02-17 ENCOUNTER — Encounter (INDEPENDENT_AMBULATORY_CARE_PROVIDER_SITE_OTHER): Payer: Self-pay | Admitting: OTOLARYNGOLOGY

## 2024-02-17 ENCOUNTER — Ambulatory Visit: Payer: Self-pay | Attending: OTOLARYNGOLOGY | Admitting: OTOLARYNGOLOGY

## 2024-02-17 ENCOUNTER — Other Ambulatory Visit: Payer: Self-pay

## 2024-02-17 VITALS — Ht 64.0 in | Wt 195.0 lb

## 2024-02-17 DIAGNOSIS — H9221 Otorrhagia, right ear: Secondary | ICD-10-CM | POA: Insufficient documentation

## 2024-02-17 DIAGNOSIS — J343 Hypertrophy of nasal turbinates: Secondary | ICD-10-CM | POA: Insufficient documentation

## 2024-02-17 DIAGNOSIS — H903 Sensorineural hearing loss, bilateral: Secondary | ICD-10-CM | POA: Insufficient documentation

## 2024-02-17 DIAGNOSIS — J3089 Other allergic rhinitis: Secondary | ICD-10-CM | POA: Insufficient documentation

## 2024-02-17 DIAGNOSIS — H6991 Unspecified Eustachian tube disorder, right ear: Secondary | ICD-10-CM | POA: Insufficient documentation

## 2024-02-17 DIAGNOSIS — Z9622 Myringotomy tube(s) status: Secondary | ICD-10-CM

## 2024-02-17 NOTE — H&P (Signed)
 ENT, [facility]  [address]  [place] [address]  Operated by [facility]      Name: Kathleen Munoz MRN:  [id number]   Date: 02/17/2024 DOB: 1946-03-28 (78 y.o.)       Referring Provider:  Elida Tanda ORN, MD    Reason for Visit:   Chief Complaint   Patient presents with    Follow Up 4 Months     4 month rc on ears and after audio.        History of Present Illness:  Kathleen Munoz is a 78 y.o. female who who presents for follow-up on hx of  right otorrhagia, history of RMT on 06/2023. Used the ear gtts. Her ear feels better in general.  Saline sinus rinses are helping.  Here to review audiogram.              Patient History:  Patient Active Problem List   Diagnosis    Bilateral pulmonary embolism (CMS HCC)    Sleep apnea    Diabetes mellitus, type 2    Depressive disorder    RSV (acute bronchiolitis due to respiratory syncytial virus)    COPD exacerbation (CMS HCC)    DVT (deep venous thrombosis)    Current use of long term anticoagulation    Rhinitis    Anxiety     Current Outpatient Medications   Medication Sig    albuterol  sulfate (PROVENTIL  OR VENTOLIN  OR PROAIR ) 90 mcg/actuation Inhalation oral inhaler Take 1-2 Puffs by inhalation Every 6 hours as needed for Other (SOB)    aspirin  81 mg Oral Tablet, Chewable Chew 1 Tablet (81 mg total) Daily    buPROPion  (WELLBUTRIN  SR) 150 mg Oral tablet sustained-release 12 hr Take 1 Tablet (150 mg total) by mouth Twice daily with breakfast and lunch    busPIRone  (BUSPAR ) 10 mg Oral Tablet Take 1 Tablet (10 mg total) by mouth Three times a day    clonazePAM  (KLONOPIN ) 1 mg Oral Tablet Take 1 Tablet (1 mg total) by mouth Three times a day    desloratadine  (CLARINEX ) 5 mg Oral Tablet TAKE 1 TABLET BY MOUTH ONCE A DAY    fluticasone propionate (FLONASE) 50 mcg/actuation Nasal Spray, Suspension SPRAY 2 SPRAY(S) (NASAL) 1 TIME PER DAY IN EACH NOSTRIL FOR ALLERGY SYMPTOMS    gabapentin  (NEURONTIN ) 400 mg Oral Capsule Take 1 Capsule (400  mg total) by mouth Three times a day    ipratropium bromide  (ATROVENT ) 21 mcg (0.03 %) Nasal nasal spray SPRAY 2 SPRAYS INTO AFFECTED NOSTRILS TWICE A DAY AS NEEDED    levothyroxine  (SYNTHROID) 88 mcg Oral Tablet Take 1 Tablet (88 mcg total) by mouth Every morning    lisinopriL -hydrochlorothiazide  (ZESTORETIC ) 20-12.5 mg Oral Tablet Take 1 Tablet by mouth Daily    multivitamin Oral Capsule Take 1 Capsule by mouth Daily    omeprazole (PRILOSEC) 40 mg Oral Capsule, Delayed Release(E.C.) Take 1 Capsule (40 mg total) by mouth Twice daily    rivaroxaban  (XARELTO ) 20 mg Oral Tablet Take 1 Tablet (20 mg total) by mouth Every evening with dinner    SITagliptin phosphate (JANUVIA) 100 mg Oral Tablet Take 1 Tablet (100 mg total) by mouth Daily    traZODone  (DESYREL ) 100 mg Oral Tablet Take 2 Tablets (200 mg total) by mouth Every night as needed      Allergies   Allergen Reactions    Influenza Virus Vaccines      Past Medical History:   Diagnosis Date  Anxiety     Arthritis     Cellulitis and abscess of right lower extremity 11/17/2021    Depression     Diabetes mellitus, type 2     Esophageal reflux     H/O blood clots     LUNGS    Hypertension     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    Personal history of fall     Shortness of breath     Sleep apnea     CPAP    Thyroid  disease      Past Surgical History:   Procedure Laterality Date    HAND SURGERY Left     HX BUNIONECTOMY      HX TUBAL LIGATION       Family Medical History:       Problem Relation (Age of Onset)    Heart Attack Mother    No Known Problems Sister, Brother, Maternal Grandmother, Maternal Grandfather, Paternal Grandmother, Paternal Grandfather, Daughter, Son, Maternal Aunt, Maternal Uncle, Paternal Aunt, Paternal Uncle, Other    Stomach Cancer Father            Social History     Tobacco Use    Smoking status: Former     Current packs/day: 0.00     Average packs/day: 1 pack/day for 15.0 years (15.0 ttl pk-yrs)     Types: Cigarettes     Start date: 51     Quit  date: 1993     Years since quitting: 32.6     Passive exposure: Past    Smokeless tobacco: Never   Vaping Use    Vaping status: Never Used   Substance Use Topics    Alcohol use: Not Currently    Drug use: Never       Review of Systems:  Review of Systems    Physical Exam:  Ht 1.626 m (5' 4)   Wt 88.5 kg (195 lb)   BMI 33.47 kg/m       Physical Exam  Constitutional:       Appearance: Normal appearance. She is well-developed, well-groomed and normal weight.   HENT:      Head: Normocephalic and atraumatic.      Right Ear: Hearing, tympanic membrane, ear canal and external ear normal.      Left Ear: Hearing, tympanic membrane, ear canal and external ear normal.      Nose: Septal deviation and mucosal edema present.      Right Turbinates: Enlarged.      Left Turbinates: Enlarged.      Mouth/Throat:      Lips: Pink.      Mouth: Mucous membranes are moist.      Pharynx: Oropharynx is clear. Uvula midline.   Eyes:      Extraocular Movements: Extraocular movements intact.   Neck:      Trachea: Phonation normal.   Pulmonary:      Effort: Pulmonary effort is normal.   Musculoskeletal:      Cervical back: Normal range of motion and neck supple.   Lymphadenopathy:      Cervical: No cervical adenopathy.   Skin:     General: Skin is warm.   Neurological:      Mental Status: She is alert and oriented to person, place, and time.      Cranial Nerves: Cranial nerves 2-12 are intact. No facial asymmetry.   Psychiatric:         Attention and Perception: Attention normal.  Mood and Affect: Mood normal.         Speech: Speech normal.         Behavior: Behavior normal. Behavior is cooperative.          Assessment:  ENCOUNTER DIAGNOSES     ICD-10-CM   1. Dysfunction of right eustachian tube  H69.91   2. Non-seasonal allergic rhinitis, unspecified trigger  J30.89   3. Nasal turbinate hypertrophy  J34.3   4. Otorrhagia of right ear  H92.21   5. 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  Audiogram reviewed and interpreted  Avoid Qtip use.   NeilMed sinus rinse daily  Continue Flonase daily  Continue Clarinex  q.h.s.  No orders of the defined types were placed in this encounter.    Follow-up 6 months or PRN        Oneil Alvine, D.O., MMS  ENT / Facial Plastic Surgery      I appreciate the opportunity to be involved in the care of your patients.  If you have any questions or concerns regarding this encounter, please do not hesitate to contact me at your convenience.        This note may have been partially generated using MModal Fluency Direct system, and there may be some incorrect words, spellings, and punctuation that were not noted in checking the note before saving, though effort was made to avoid such errors.

## 2024-03-11 ENCOUNTER — Other Ambulatory Visit (HOSPITAL_BASED_OUTPATIENT_CLINIC_OR_DEPARTMENT_OTHER): Payer: Self-pay | Admitting: FAMILY PRACTICE

## 2024-03-11 ENCOUNTER — Other Ambulatory Visit: Payer: Self-pay

## 2024-03-11 ENCOUNTER — Ambulatory Visit
Admission: RE | Admit: 2024-03-11 | Discharge: 2024-03-11 | Disposition: A | Discharge status: Home / Self Care | Source: Ambulatory Visit | Attending: FAMILY PRACTICE | Admitting: FAMILY PRACTICE

## 2024-03-11 DIAGNOSIS — M25562 Pain in left knee: Secondary | ICD-10-CM

## 2024-03-11 DIAGNOSIS — M1712 Unilateral primary osteoarthritis, left knee: Secondary | ICD-10-CM

## 2024-03-11 DIAGNOSIS — M1711 Unilateral primary osteoarthritis, right knee: Secondary | ICD-10-CM

## 2024-03-11 DIAGNOSIS — M25561 Pain in right knee: Secondary | ICD-10-CM | POA: Insufficient documentation

## 2024-04-02 ENCOUNTER — Other Ambulatory Visit (INDEPENDENT_AMBULATORY_CARE_PROVIDER_SITE_OTHER): Payer: Self-pay | Admitting: NURSE PRACTITIONER

## 2024-06-06 ENCOUNTER — Other Ambulatory Visit

## 2024-06-06 ENCOUNTER — Ambulatory Visit (INDEPENDENT_AMBULATORY_CARE_PROVIDER_SITE_OTHER): Payer: Self-pay

## 2024-06-06 ENCOUNTER — Other Ambulatory Visit: Payer: Self-pay

## 2024-06-06 DIAGNOSIS — E559 Vitamin D deficiency, unspecified: Secondary | ICD-10-CM | POA: Insufficient documentation

## 2024-06-06 DIAGNOSIS — N1832 Chronic kidney disease, stage 3b: Secondary | ICD-10-CM

## 2024-06-06 DIAGNOSIS — E119 Type 2 diabetes mellitus without complications: Secondary | ICD-10-CM | POA: Insufficient documentation

## 2024-06-06 LAB — CBC WITH DIFF
BASOPHIL #: 0.1 x10ˆ3/uL (ref 0.00–0.10)
BASOPHIL %: 1 % (ref 0–1)
EOSINOPHIL #: 0.2 x10ˆ3/uL (ref 0.00–0.50)
EOSINOPHIL %: 3 % (ref 1–7)
HCT: 36.2 % (ref 31.2–41.9)
HGB: 12.7 g/dL (ref 10.9–14.3)
LYMPHOCYTE #: 2.2 x10ˆ3/uL (ref 1.10–3.10)
LYMPHOCYTE %: 27 % (ref 16–46)
MCH: 29.8 pg (ref 24.7–32.8)
MCHC: 35 g/dL (ref 32.3–35.6)
MCV: 85.2 fL (ref 75.5–95.3)
MONOCYTE #: 0.9 x10ˆ3/uL (ref 0.20–0.90)
MONOCYTE %: 11 % (ref 4–11)
MPV: 9.6 fL (ref 7.9–10.8)
NEUTROPHIL #: 4.8 x10ˆ3/uL (ref 1.90–8.20)
NEUTROPHIL %: 58 % (ref 43–77)
PLATELETS: 296 x10ˆ3/uL (ref 140–440)
RBC: 4.25 x10ˆ6/uL (ref 3.63–4.92)
RDW: 13 % (ref 12.3–17.7)
WBC: 8.2 x10ˆ3/uL (ref 3.8–11.8)

## 2024-06-06 LAB — COMPREHENSIVE METABOLIC PANEL, NON-FASTING
ALBUMIN/GLOBULIN RATIO: 1.5 — ABNORMAL HIGH (ref 0.8–1.4)
ALBUMIN: 4.5 g/dL (ref 3.5–5.7)
ALKALINE PHOSPHATASE: 50 U/L (ref 34–104)
ALT (SGPT): 20 U/L (ref 7–52)
ANION GAP: 3 mmol/L — ABNORMAL LOW (ref 4–13)
AST (SGOT): 20 U/L (ref 13–39)
BILIRUBIN TOTAL: 0.3 mg/dL (ref 0.3–1.0)
BUN/CREA RATIO: 15 (ref 6–22)
BUN: 16 mg/dL (ref 7–25)
CALCIUM, CORRECTED: 9.3 mg/dL (ref 8.9–10.8)
CALCIUM: 9.7 mg/dL (ref 8.6–10.3)
CHLORIDE: 99 mmol/L (ref 98–107)
CO2 TOTAL: 32 mmol/L — ABNORMAL HIGH (ref 21–31)
CREATININE: 1.04 mg/dL (ref 0.60–1.30)
ESTIMATED GFR: 55 mL/min/1.73mˆ2 — ABNORMAL LOW (ref >59)
GLOBULIN: 3 (ref 2.0–3.5)
GLUCOSE: 71 mg/dL — ABNORMAL LOW (ref 74–109)
OSMOLALITY, CALCULATED: 268 mosm/kg — ABNORMAL LOW (ref 270–290)
POTASSIUM: 4.2 mmol/L (ref 3.5–5.1)
PROTEIN TOTAL: 7.5 g/dL (ref 6.4–8.9)
SODIUM: 134 mmol/L — ABNORMAL LOW (ref 136–145)

## 2024-06-06 LAB — HGA1C (HEMOGLOBIN A1C WITH EST AVG GLUCOSE): HEMOGLOBIN A1C: 5.6 % (ref 4.0–6.0)

## 2024-06-06 LAB — MAGNESIUM: MAGNESIUM: 1.7 mg/dL — ABNORMAL LOW (ref 1.9–2.7)

## 2024-06-06 LAB — PROTEIN/CREATININE RATIO, URINE, RANDOM
CREATININE RANDOM URINE: 16 mg/dL — ABNORMAL LOW (ref 30–125)
PROTEIN RANDOM URINE: <4 mg/dL — ABNORMAL LOW (ref 50–80)

## 2024-06-06 LAB — PARATHYROID HORMONE (PTH): PTH: 53.6 pg/mL (ref 12.0–88.0)

## 2024-06-06 LAB — URIC ACID: URIC ACID: 5.3 mg/dL (ref 2.3–7.6)

## 2024-06-06 LAB — MICROALBUMIN/CREATININE RATIO, URINE, RANDOM
CREATININE RANDOM URINE: 16 mg/dL — ABNORMAL LOW (ref 30–125)
MICROALBUMIN RANDOM URINE: <0.7 mg/dL

## 2024-06-06 LAB — VITAMIN D 25 TOTAL: VITAMIN D 25, TOTAL: 83.77 ng/mL (ref 30.00–100.00)

## 2024-06-13 ENCOUNTER — Other Ambulatory Visit: Payer: Self-pay

## 2024-06-13 ENCOUNTER — Ambulatory Visit (INDEPENDENT_AMBULATORY_CARE_PROVIDER_SITE_OTHER): Payer: Self-pay

## 2024-06-13 ENCOUNTER — Encounter (INDEPENDENT_AMBULATORY_CARE_PROVIDER_SITE_OTHER): Payer: Self-pay

## 2024-06-13 VITALS — BP 136/52 | HR 64 | Ht 64.0 in | Wt 193.0 lb

## 2024-06-13 DIAGNOSIS — Z7984 Long term (current) use of oral hypoglycemic drugs: Secondary | ICD-10-CM

## 2024-06-13 DIAGNOSIS — E559 Vitamin D deficiency, unspecified: Secondary | ICD-10-CM

## 2024-06-13 DIAGNOSIS — E119 Type 2 diabetes mellitus without complications: Secondary | ICD-10-CM

## 2024-06-13 DIAGNOSIS — I129 Hypertensive chronic kidney disease with stage 1 through stage 4 chronic kidney disease, or unspecified chronic kidney disease: Secondary | ICD-10-CM

## 2024-06-13 DIAGNOSIS — N1831 Chronic kidney disease, stage 3a: Secondary | ICD-10-CM

## 2024-06-13 DIAGNOSIS — E1122 Type 2 diabetes mellitus with diabetic chronic kidney disease: Secondary | ICD-10-CM

## 2024-06-13 DIAGNOSIS — I10 Essential (primary) hypertension: Secondary | ICD-10-CM

## 2024-06-13 MED ORDER — MAGNESIUM OXIDE 400 MG (241.3 MG MAGNESIUM) TABLET: 400.0000 mg | ORAL_TABLET | Freq: Every day | ORAL | 0 refills | Status: AC

## 2024-06-13 NOTE — Progress Notes (Signed)
 NEPHROLOGY, [facility]  [address]  [place] [address]    Progress Note    Name: Kathleen Munoz MRN:  [id number]   Date: 06/13/2024 DOB:  06-Feb-1946 (78 y.o.)              Nephrology Out  Patient Visit         HPI : 78 y.o.  female here to establish care for chronic kidney disease.  Creatinine 1.04 GFR 55 CKD stage IIIA.   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  Currently taking Xaelto 20 mg daily.  Denies hematuria and dysuria.  Discussed labs.  Hold vitamin-D supplements for now.  Blood pressure is at goal.  Hemoglobin A1c 5.6.  Denies nausea and vomiting.       ROS:     Assessment was negative except what mentioned in in the HPI.     OBJECTIVE:   BP (!) 136/52   Pulse 64   Ht 1.626 m (5' 4)   Wt 87.5 kg (193 lb)   BMI 33.13 kg/m       General:  NAD, AAOx3  HEENT:  EOMI,  no icterus  NECK: No increased JVD.  Normal inspection   HEART:  Regular rhythm. No murmurs or rubs. No chest wall tenderness   LUNGS: Clear to auscultation bilateral. No wheezes, rales, or rhonchi.   ABDOMEN: +BS, Soft, nontender and nondistended. No rebound or guarding present.   EXTREMITIES: No edema. No cyanosis or clubbing.No asterixis    NEURO : Normal speech, EOMI.       LABORATORY DATA:   Lab Results   Component Value Date    BUN 16 06/06/2024    BUN 17 01/30/2023    BUN 21 07/04/2022    CREATININE 1.04 06/06/2024    CREATININE 1.12 01/30/2023    CREATININE 1.09 07/04/2022    BUNCRRATIO 15 06/06/2024    BUNCRRATIO 15 01/30/2023    BUNCRRATIO 19 07/04/2022    GFR 55 (L) 06/06/2024    GFR 51 (L) 01/30/2023    GFR 53 (L) 07/04/2022    SODIUM 134 (L) 06/06/2024    SODIUM 140 01/30/2023    SODIUM 140 07/04/2022    POTASSIUM 4.2 06/06/2024    POTASSIUM 4.2 01/30/2023    POTASSIUM 4.7 07/04/2022    CHLORIDE 99 06/06/2024    CHLORIDE 102 01/30/2023    CHLORIDE 105 07/04/2022    CO2 32 (H) 06/06/2024    CO2 31 01/30/2023    CO2 29  07/04/2022    ANIONGAP 3 (L) 06/06/2024    ANIONGAP 7 01/30/2023    ANIONGAP 6 07/04/2022    CALCIUM 9.7 06/06/2024    CALCIUM 9.8 01/30/2023    CALCIUM 9.5 07/04/2022    ALBUMIN 4.5 06/06/2024    ALBUMIN 4.4 01/30/2023    ALBUMIN 4.2 07/02/2022    HGB 12.7 06/06/2024    HGB 12.6 01/30/2023    HGB 11.8 07/04/2022    HCT 36.2 06/06/2024    HCT 37.8 01/30/2023    HCT 35.8 07/04/2022    INTACTPTH 53.6 06/06/2024           MEDICATIONS:  Outpatient Medications Marked as Taking for the 06/13/24 encounter (Office Visit) with Cathryne Iha, APRN, CNP   Medication Sig    oxygen (O2) Inhalation gas Take 2 L/min by inhalation continuous     Current Outpatient Medications   Medication Instructions    albuterol  sulfate (PROVENTIL  OR VENTOLIN  OR  PROAIR ) 90 mcg/actuation Inhalation oral inhaler 1-2 Puffs, EVERY 6 HOURS PRN    aspirin  81 mg, Daily    buPROPion  (WELLBUTRIN  SR) 150 mg, Oral, 2 TIMES DAILY WITH BREAKFAST & LUNCH    busPIRone  (BUSPAR ) 10 mg, Oral, 3 TIMES DAILY    clonazePAM  (KLONOPIN ) 1 mg, 3 TIMES DAILY    desloratadine  (CLARINEX ) 5 mg, Oral, Daily    fluticasone propionate (FLONASE) 50 mcg/actuation Nasal Spray, Suspension SPRAY 2 SPRAY(S) (NASAL) 1 TIME PER DAY IN EACH NOSTRIL FOR ALLERGY SYMPTOMS    gabapentin  (NEURONTIN ) 400 mg, 3 TIMES DAILY    ipratropium bromide  (ATROVENT ) 21 mcg (0.03 %) Nasal nasal spray SPRAY 2 SPRAYS INTO AFFECTED NOSTRILS TWICE A DAY AS NEEDED    levothyroxine  (SYNTHROID) 88 mcg, EVERY MORNING    lisinopriL -hydrochlorothiazide  (ZESTORETIC ) 20-12.5 mg Oral Tablet 1 Tablet, Daily    multivitamin Oral Capsule 1 Capsule, Daily    omeprazole (PRILOSEC) 40 mg, 2 TIMES DAILY    oxygen (O2) 2 L/min, CONTINUOUS    rivaroxaban  (XARELTO ) 20 mg, Oral, EVERY EVENING WITH DINNER    SITagliptin phosphate (JANUVIA) 100 mg, Daily    traZODone  (DESYREL ) 200 mg, NIGHTLY PRN      ASSESSMENT / PLAN:  ENCOUNTER DIAGNOSES     ICD-10-CM   1. Stage 3a chronic kidney disease (CMS HCC)  N18.31   2. Hypertension,  unspecified type  I10   3. DM2 (diabetes mellitus, type 2)  E11.9   4. Hypomagnesemia  E83.42   5. 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    -Avoid NSAIDs.  Tylenol  only for pain  -Renal imaging:  Kidney is unremarkable  -lisinopril -hydrochlorothiazide  20 mg-12.5 mg daily  -Sodium Glucose Cotransporter-2 (SGLT2) Inhibitors:   -can't take jardiance  due to side effects         Hypertension  -Blood pressure is acceptable  -Goal less than 130/80  -Continue lisinopril -hydrochlorothiazide  20-12.5 mg daily  -Low-salt diet      Diabetes type 2  -A1c goal is less than 7   Lab Results   Component Value Date    HA1C 5.6 06/06/2024      -Continue Januvia  100 mg daily   -Diabetic diet  -Increase activity and exercise as possible  -Monitor A1C        Vitamin-D deficiency  -vitamin D  level elevated  -Hold vitamin D  supplements.   -obtain vitamin-D level       I have discussed with the patient the following issues:  The main goal of treatment is to prevent progression of CKD to complete kidney failure. The best way to do this is to control the underlying cause.  the optimal diet is one similar to the Dietary Approaches to Stop Hypertension (DASH) diet, consisting of fruits, vegetables, legumes, fish, poultry, and whole grains.  Restrict sodium intake to less than 2 g/day            Orders Placed This Encounter    BASIC METABOLIC PANEL    CBC/DIFF    MAGNESIUM     MICROALBUMIN/CREATININE RATIO, URINE, RANDOM    PARATHYROID  HORMONE (  PTH)    PROTEIN/CREATININE RATIO, URINE, RANDOM    URIC ACID    VITAMIN D  25 TOTAL       Suzen Risser, NP

## 2024-07-10 ENCOUNTER — Other Ambulatory Visit (INDEPENDENT_AMBULATORY_CARE_PROVIDER_SITE_OTHER): Payer: Self-pay

## 2024-07-11 NOTE — Telephone Encounter (Signed)
 Done with Treatment

## 2024-07-21 ENCOUNTER — Other Ambulatory Visit (HOSPITAL_COMMUNITY): Payer: Self-pay | Admitting: Orthopaedic Surgery

## 2024-07-21 ENCOUNTER — Telehealth (INDEPENDENT_AMBULATORY_CARE_PROVIDER_SITE_OTHER): Payer: Self-pay | Admitting: NURSE PRACTITIONER

## 2024-07-21 DIAGNOSIS — D689 Coagulation defect, unspecified: Secondary | ICD-10-CM

## 2024-07-21 DIAGNOSIS — M170 Bilateral primary osteoarthritis of knee: Secondary | ICD-10-CM

## 2024-08-18 ENCOUNTER — Telehealth (INDEPENDENT_AMBULATORY_CARE_PROVIDER_SITE_OTHER): Payer: Self-pay | Admitting: NURSE PRACTITIONER

## 2024-08-18 NOTE — Telephone Encounter (Signed)
 Referral from [facility] for Hematology Clearance.- Previous pt of Jon Molly; per pts req need to r/s due to weather 08-11-24 RW; spoke w pt gave sooner apt per her request she is going to try and make it i made aware we typically do not give surgical clearance on the first visit if you are a new patient but that she can tell per provider the urgency when she is seen tomorrow anh 08/18/24; pt had lvm on another option so called back had to lvm asking her to return call and press option 1 for appts if she ahs any additional questions anh 08/18/24

## 2024-08-19 ENCOUNTER — Ambulatory Visit (INDEPENDENT_AMBULATORY_CARE_PROVIDER_SITE_OTHER): Payer: Self-pay

## 2024-08-19 ENCOUNTER — Ambulatory Visit (INDEPENDENT_AMBULATORY_CARE_PROVIDER_SITE_OTHER)

## 2024-08-19 ENCOUNTER — Ambulatory Visit (INDEPENDENT_AMBULATORY_CARE_PROVIDER_SITE_OTHER): Payer: Self-pay | Admitting: NURSE PRACTITIONER

## 2024-08-19 ENCOUNTER — Ambulatory Visit (INDEPENDENT_AMBULATORY_CARE_PROVIDER_SITE_OTHER): Admitting: NURSE PRACTITIONER

## 2024-09-09 ENCOUNTER — Ambulatory Visit (INDEPENDENT_AMBULATORY_CARE_PROVIDER_SITE_OTHER): Admitting: NURSE PRACTITIONER

## 2024-09-09 ENCOUNTER — Ambulatory Visit (INDEPENDENT_AMBULATORY_CARE_PROVIDER_SITE_OTHER)

## 2024-09-12 ENCOUNTER — Encounter (INDEPENDENT_AMBULATORY_CARE_PROVIDER_SITE_OTHER): Payer: Self-pay

## 2024-09-19 ENCOUNTER — Encounter (HOSPITAL_COMMUNITY): Payer: Self-pay

## 2024-09-19 ENCOUNTER — Inpatient Hospital Stay: Admit: 2024-09-19 | Admitting: Orthopaedic Surgery

## 2024-09-19 SURGERY — ARTHOPLASTY KNEE TOTAL MAKO ROBOTIC ASSISTED
Anesthesia: Other | Site: Knee | Laterality: Bilateral

## 2024-10-10 ENCOUNTER — Other Ambulatory Visit (INDEPENDENT_AMBULATORY_CARE_PROVIDER_SITE_OTHER): Payer: Self-pay

## 2024-10-17 ENCOUNTER — Encounter (INDEPENDENT_AMBULATORY_CARE_PROVIDER_SITE_OTHER)
